# Patient Record
Sex: Female | Born: 1955 | Race: White | Hispanic: Yes | Marital: Single | Smoking: Never smoker
Health system: Southern US, Community
[De-identification: ages and names within clinical notes are randomized; demographics above are authoritative.]

## PROBLEM LIST (undated history)

## (undated) DIAGNOSIS — I10 Essential (primary) hypertension: Secondary | ICD-10-CM

## (undated) DIAGNOSIS — I2510 Atherosclerotic heart disease of native coronary artery without angina pectoris: Secondary | ICD-10-CM

## (undated) DIAGNOSIS — E119 Type 2 diabetes mellitus without complications: Secondary | ICD-10-CM

## (undated) HISTORY — DX: Atherosclerotic heart disease of native coronary artery without angina pectoris: I25.10

## (undated) HISTORY — PX: ABDOMINAL HYSTERECTOMY: SHX81

---

## 2016-01-30 ENCOUNTER — Emergency Department (HOSPITAL_COMMUNITY): Payer: Self-pay

## 2016-01-30 ENCOUNTER — Encounter (HOSPITAL_COMMUNITY): Payer: Self-pay | Admitting: *Deleted

## 2016-01-30 ENCOUNTER — Emergency Department (HOSPITAL_COMMUNITY)
Admission: EM | Admit: 2016-01-30 | Discharge: 2016-01-30 | Disposition: A | Discharge status: Home / Self Care | Payer: Self-pay | Attending: Emergency Medicine | Admitting: Emergency Medicine

## 2016-01-30 DIAGNOSIS — Z79899 Other long term (current) drug therapy: Secondary | ICD-10-CM | POA: Insufficient documentation

## 2016-01-30 DIAGNOSIS — J189 Pneumonia, unspecified organism: Secondary | ICD-10-CM

## 2016-01-30 DIAGNOSIS — J181 Lobar pneumonia, unspecified organism: Secondary | ICD-10-CM | POA: Insufficient documentation

## 2016-01-30 LAB — CBC WITH DIFFERENTIAL/PLATELET
BASOS PCT: 0 %
Basophils Absolute: 0.1 10*3/uL (ref 0.0–0.1)
Eosinophils Absolute: 0 10*3/uL (ref 0.0–0.7)
Eosinophils Relative: 0 %
HEMATOCRIT: 42.2 % (ref 36.0–46.0)
HEMOGLOBIN: 14.1 g/dL (ref 12.0–15.0)
LYMPHS ABS: 3.2 10*3/uL (ref 0.7–4.0)
LYMPHS PCT: 25 %
MCH: 27.5 pg (ref 26.0–34.0)
MCHC: 33.4 g/dL (ref 30.0–36.0)
MCV: 82.4 fL (ref 78.0–100.0)
MONOS PCT: 5 %
Monocytes Absolute: 0.7 10*3/uL (ref 0.1–1.0)
NEUTROS ABS: 8.9 10*3/uL — AB (ref 1.7–7.7)
NEUTROS PCT: 70 %
Platelets: 463 10*3/uL — ABNORMAL HIGH (ref 150–400)
RBC: 5.12 MIL/uL — ABNORMAL HIGH (ref 3.87–5.11)
RDW: 13.3 % (ref 11.5–15.5)
WBC: 12.8 10*3/uL — ABNORMAL HIGH (ref 4.0–10.5)

## 2016-01-30 LAB — I-STAT TROPONIN, ED: Troponin i, poc: 0.01 ng/mL (ref 0.00–0.08)

## 2016-01-30 LAB — COMPREHENSIVE METABOLIC PANEL
ALBUMIN: 3.2 g/dL — AB (ref 3.5–5.0)
ALK PHOS: 110 U/L (ref 38–126)
ALT: 21 U/L (ref 14–54)
ANION GAP: 7 (ref 5–15)
AST: 33 U/L (ref 15–41)
BILIRUBIN TOTAL: 0.5 mg/dL (ref 0.3–1.2)
BUN: 13 mg/dL (ref 6–20)
CALCIUM: 9.8 mg/dL (ref 8.9–10.3)
CO2: 26 mmol/L (ref 22–32)
Chloride: 107 mmol/L (ref 101–111)
Creatinine, Ser: 0.78 mg/dL (ref 0.44–1.00)
GFR calc Af Amer: >60 mL/min (ref >60)
GFR calc non Af Amer: >60 mL/min (ref >60)
GLUCOSE: 91 mg/dL (ref 65–99)
Potassium: 3.7 mmol/L (ref 3.5–5.1)
Sodium: 140 mmol/L (ref 135–145)
TOTAL PROTEIN: 6.8 g/dL (ref 6.5–8.1)

## 2016-01-30 MED ORDER — HYDROCOD POLST-CPM POLST ER 10-8 MG/5ML PO SUER
5.0000 mL | Freq: Once | ORAL | Status: AC
  Administered 2016-01-30: 5 mL via ORAL
  Filled 2016-01-30: qty 5

## 2016-01-30 MED ORDER — HYDROCODONE-HOMATROPINE 5-1.5 MG/5ML PO SYRP: 5.0000 mL | ORAL_SOLUTION | Freq: Four times a day (QID) | ORAL | 0 refills | Status: DC | PRN

## 2016-01-30 MED ORDER — PREDNISONE 10 MG PO TABS: 20.0000 mg | ORAL_TABLET | Freq: Every day | ORAL | 0 refills | Status: DC

## 2016-01-30 MED ORDER — DOXYCYCLINE HYCLATE 100 MG PO CAPS: 100.0000 mg | ORAL_CAPSULE | Freq: Two times a day (BID) | ORAL | 0 refills | Status: DC

## 2016-01-30 MED ORDER — IPRATROPIUM-ALBUTEROL 0.5-2.5 (3) MG/3ML IN SOLN
3.0000 mL | Freq: Once | RESPIRATORY_TRACT | Status: AC
  Administered 2016-01-30: 3 mL via RESPIRATORY_TRACT
  Filled 2016-01-30: qty 3

## 2016-01-30 NOTE — ED Notes (Signed)
Tammy Osborne [phone] - translator used

## 2016-01-30 NOTE — ED Notes (Signed)
Patient transported to X-ray 

## 2016-01-30 NOTE — ED Provider Notes (Signed)
[facility] DEPT Provider Note   CSN: [id number] Arrival date & time: 01/30/16  1110     History   Chief Complaint Chief Complaint  Patient presents with  . Chest Pain    HPI Tammy Osborne is a 61 y.o. female.  Cough and congestion for 3 weeks with associated chest pain associated with cough. She is already been on a course of Zithromax, meloxicam, Tessalon Perles with minimal relief.  No crushing substernal chest pain, dyspnea, diaphoresis, nausea. Past medical history includes hypertension. Nonsmoker. Severity is moderate.      History reviewed. No pertinent past medical history.  There are no active problems to display for this patient.   History reviewed. No pertinent surgical history.  OB History    No data available       Home Medications    Prior to Admission medications   Medication Sig Start Date End Date Taking? Authorizing Provider  albuterol (PROVENTIL HFA;VENTOLIN HFA) 108 (90 Base) MCG/ACT inhaler Inhale 2 puffs into the lungs every 6 (six) hours as needed for wheezing or shortness of breath.   Yes Historical Provider, MD  amLODipine (NORVASC) 5 MG tablet Take 5 mg by mouth daily. 12/30/15  Yes Historical Provider, MD  meloxicam (MOBIC) 15 MG tablet Take 15 mg by mouth daily.   Yes Historical Provider, MD  doxycycline (VIBRAMYCIN) 100 MG capsule Take 1 capsule (100 mg total) by mouth 2 (two) times daily. 01/30/16   Donnetta Hutching, MD  HYDROcodone-homatropine [facility]) 5-1.5 MG/5ML syrup Take 5 mLs by mouth every 6 (six) hours as needed for cough. 01/30/16   Donnetta Hutching, MD  predniSONE (DELTASONE) 10 MG tablet Take 2 tablets (20 mg total) by mouth daily. 01/30/16   Donnetta Hutching, MD    Family History No family history on file.  Social History Social History  Substance Use Topics  . Smoking status: Not on file  . Smokeless tobacco: Not on file  . Alcohol use Not on file     Allergies   Patient has no known allergies.   Review of Systems Review of  Systems  All other systems reviewed and are negative.    Physical Exam Updated Vital Signs BP 184/96   Pulse 71   Temp 98.1 F (36.7 C) (Oral)   Resp 19   LMP  (LMP Unknown)   SpO2 90%   Physical Exam  Constitutional: She is oriented to person, place, and time.  Coughing, no acute distress  HENT:  Head: Normocephalic and atraumatic.  Eyes: Conjunctivae are normal.  Neck: Neck supple.  Cardiovascular: Normal rate and regular rhythm.   Pulmonary/Chest: Effort normal and breath sounds normal.  Abdominal: Soft. Bowel sounds are normal.  Musculoskeletal: Normal range of motion.  Neurological: She is alert and oriented to person, place, and time.  Skin: Skin is warm and dry.  Psychiatric: She has a normal mood and affect. Her behavior is normal.  Nursing note and vitals reviewed.    ED Treatments / Results  Labs (all labs ordered are listed, but only abnormal results are displayed) Labs Reviewed  CBC WITH DIFFERENTIAL/PLATELET - Abnormal; Notable for the following:       Result Value   WBC 12.8 (*)    RBC 5.12 (*)    Platelets 463 (*)    Neutro Abs 8.9 (*)    All other components within normal limits  COMPREHENSIVE METABOLIC PANEL - Abnormal; Notable for the following:    Albumin 3.2 (*)    All other components  within normal limits  I-STAT TROPOININ, ED    EKG  EKG Interpretation None       Radiology Dg Chest 2 View  Result Date: 01/30/2016 CLINICAL DATA:  Pt reports left upper chest pain, SOB, and cough x 3 weeks EXAM: CHEST  2 VIEW COMPARISON:  None. FINDINGS: Cardiac silhouette is normal in size. No mediastinal or hilar masses. No convincing adenopathy. There are thickened bronchovascular markings bilaterally most apparent at the lung bases. Mild hazy airspace opacity is noted at the right base. Findings are consistent with combination of diffuse bronchial infection/ inflammation and right lower lobe pneumonia. No convincing pulmonary edema.  No pleural  effusion or pneumothorax. Skeletal structures are intact. IMPRESSION: 1. Diffuse bronchial wall inflammation/infection and smaller area of right lower lobe pneumonia. Electronically Signed   By: Amie Portlandavid  Ormond M.D.   On: 01/30/2016 12:14    Procedures Procedures (including critical care time)  Medications Ordered in ED Medications  ipratropium-albuterol (DUONEB) 0.5-2.5 (3) MG/3ML nebulizer solution 3 mL (3 mLs Nebulization Given 01/30/16 1249)  chlorpheniramine-HYDROcodone (TUSSIONEX) 10-8 MG/5ML suspension 5 mL (5 mLs Oral Given 01/30/16 1250)     Initial Impression / Assessment and Plan / ED Course  I have reviewed the triage vital signs and the nursing notes.  Pertinent labs & imaging results that were available during my care of the patient were reviewed by me and considered in my medical decision making (see chart for details).  Clinical Course     Patient is in no acute distress. Language line used.  Chest x-ray shows a small area of right lower lobe pneumonia. She is medically stable. She has failed a course of Zithromax. Will change Rx to doxycycline 100 mg twice a day and add prednisone and Hycodan cough syrup.  She will be using her inhaler also  Final Clinical Impressions(s) / ED Diagnoses   Final diagnoses:  Community acquired pneumonia of right middle lobe of lung (HCC)    New Prescriptions New Prescriptions   DOXYCYCLINE (VIBRAMYCIN) 100 MG CAPSULE    Take 1 capsule (100 mg total) by mouth 2 (two) times daily.   HYDROCODONE-HOMATROPINE (HYCODAN) 5-1.5 MG/5ML SYRUP    Take 5 mLs by mouth every 6 (six) hours as needed for cough.   PREDNISONE (DELTASONE) 10 MG TABLET    Take 2 tablets (20 mg total) by mouth daily.     Donnetta HutchingBrian Ronasia Isola, MD 01/30/16 [phone]

## 2016-01-30 NOTE — ED Triage Notes (Signed)
To ED for eval of cough, congestion, and cp for 3 wks. Taking prescribed meds and abx without relief

## 2016-01-30 NOTE — Discharge Instructions (Signed)
You have a small amount of pneumonia in your right lung.  Prescription for antibiotic, cough medicine and prednisone.  You can also take Tylenol or ibuprofen for fever or pain. Continue your inhaler, but discontinue the other prescriptions

## 2016-01-30 NOTE — ED Provider Notes (Signed)
[facility] Provider Note   CSN: [id number] Arrival date & time: 01/30/16  1110     History   Chief Complaint Chief Complaint  Patient presents with  . Chest Pain    HPI Tammy Osborne is a 61 y.o. female.  Patient presents with cough and chest congestion for 2-3 weeks. She has been on Zithromax, Tessalon Perles, meloxicam with minimal relief. No crushing substernal chest pain, diaphoresis, nausea. She is ambulatory and drinking fluids. Language line used for interpretation.      History reviewed. No pertinent past medical history.  There are no active problems to display for this patient.   History reviewed. No pertinent surgical history.  OB History    No data available       Home Medications    Prior to Admission medications   Medication Sig Start Date End Date Taking? Authorizing Provider  albuterol (PROVENTIL HFA;VENTOLIN HFA) 108 (90 Base) MCG/ACT inhaler Inhale 2 puffs into the lungs every 6 (six) hours as needed for wheezing or shortness of breath.   Yes Historical Provider, MD  amLODipine (NORVASC) 5 MG tablet Take 5 mg by mouth daily. 12/30/15  Yes Historical Provider, MD  meloxicam (MOBIC) 15 MG tablet Take 15 mg by mouth daily.   Yes Historical Provider, MD  HYDROcodone-homatropine (HYCODAN) 5-1.5 MG/5ML syrup Take 5 mLs by mouth every 6 (six) hours as needed for cough. 01/30/16   Donnetta HutchingBrian Oluwadarasimi Redmon, MD  predniSONE (DELTASONE) 10 MG tablet Take 2 tablets (20 mg total) by mouth daily. 01/30/16   Donnetta HutchingBrian Kaylea Mounsey, MD    Family History No family history on file.  Social History Social History  Substance Use Topics  . Smoking status: Not on file  . Smokeless tobacco: Not on file  . Alcohol use Not on file     Allergies   Patient has no known allergies.   Review of Systems Review of Systems  All other systems reviewed and are negative.    Physical Exam Updated Vital Signs BP 184/96   Pulse 71   Temp 98.1 F (36.7 C) (Oral)   Resp 19   LMP  (LMP  Unknown)   SpO2 90%   Physical Exam  Constitutional: She is oriented to person, place, and time.  Coughing but nontoxic-appearing.  HENT:  Head: Normocephalic and atraumatic.  Eyes: Conjunctivae are normal.  Neck: Neck supple.  Cardiovascular: Normal rate and regular rhythm.   Pulmonary/Chest: Effort normal and breath sounds normal.  Abdominal: Soft. Bowel sounds are normal.  Musculoskeletal: Normal range of motion.  Neurological: She is alert and oriented to person, place, and time.  Skin: Skin is warm and dry.  Psychiatric: She has a normal mood and affect. Her behavior is normal.  Nursing note and vitals reviewed.    ED Treatments / Results  Labs (all labs ordered are listed, but only abnormal results are displayed) Labs Reviewed  CBC WITH DIFFERENTIAL/PLATELET - Abnormal; Notable for the following:       Result Value   WBC 12.8 (*)    RBC 5.12 (*)    Platelets 463 (*)    Neutro Abs 8.9 (*)    All other components within normal limits  COMPREHENSIVE METABOLIC PANEL - Abnormal; Notable for the following:    Albumin 3.2 (*)    All other components within normal limits  I-STAT TROPOININ, ED    EKG  EKG Interpretation None       Radiology Dg Chest 2 View  Result Date: 01/30/2016 CLINICAL DATA:  Pt reports left upper chest pain, SOB, and cough x 3 weeks EXAM: CHEST  2 VIEW COMPARISON:  None. FINDINGS: Cardiac silhouette is normal in size. No mediastinal or hilar masses. No convincing adenopathy. There are thickened bronchovascular markings bilaterally most apparent at the lung bases. Mild hazy airspace opacity is noted at the right base. Findings are consistent with combination of diffuse bronchial infection/ inflammation and right lower lobe pneumonia. No convincing pulmonary edema.  No pleural effusion or pneumothorax. Skeletal structures are intact. IMPRESSION: 1. Diffuse bronchial wall inflammation/infection and smaller area of right lower lobe pneumonia.  Electronically Signed   By: Amie Portland M.D.   On: 01/30/2016 12:14    Procedures Procedures (including critical care time)  Medications Ordered in ED Medications  ipratropium-albuterol (DUONEB) 0.5-2.5 (3) MG/3ML nebulizer solution 3 mL (3 mLs Nebulization Given 01/30/16 1249)  chlorpheniramine-HYDROcodone (TUSSIONEX) 10-8 MG/5ML suspension 5 mL (5 mLs Oral Given 01/30/16 1250)     Initial Impression / Assessment and Plan / ED Course  I have reviewed the triage vital signs and the nursing notes.  Pertinent labs & imaging results that were available during my care of the patient were reviewed by me and considered in my medical decision making (see chart for details).  Clinical Course     History and physical most consistent with prolonged upper respiratory infection. Doubt ACS. Chest x-ray shows a right lower lobe pneumonia. Patient has been on Zithromax.  Discharge medications doxycycline 100 mg, Hycodan cough syrup, prednisone.  Final Clinical Impressions(s) / ED Diagnoses   Final diagnoses:  Community acquired pneumonia of right middle lobe of lung (HCC)    New Prescriptions New Prescriptions   HYDROCODONE-HOMATROPINE (HYCODAN) 5-1.5 MG/5ML SYRUP    Take 5 mLs by mouth every 6 (six) hours as needed for cough.   PREDNISONE (DELTASONE) 10 MG TABLET    Take 2 tablets (20 mg total) by mouth daily.     Donnetta Hutching, MD 01/31/16 [phone]

## 2016-01-30 NOTE — ED Notes (Signed)
Tammy DikeJennifer [phone] translator used to go over discharge instructions.

## 2016-01-30 NOTE — ED Notes (Signed)
Pt returned to room from xray.

## 2016-04-06 ENCOUNTER — Emergency Department (HOSPITAL_COMMUNITY): Payer: Self-pay

## 2016-04-06 ENCOUNTER — Emergency Department (HOSPITAL_COMMUNITY)
Admission: EM | Admit: 2016-04-06 | Discharge: 2016-04-06 | Disposition: A | Discharge status: Home / Self Care | Payer: Self-pay | Attending: Emergency Medicine | Admitting: Emergency Medicine

## 2016-04-06 ENCOUNTER — Encounter (HOSPITAL_COMMUNITY): Payer: Self-pay | Admitting: Emergency Medicine

## 2016-04-06 DIAGNOSIS — J189 Pneumonia, unspecified organism: Secondary | ICD-10-CM | POA: Insufficient documentation

## 2016-04-06 DIAGNOSIS — I10 Essential (primary) hypertension: Secondary | ICD-10-CM | POA: Insufficient documentation

## 2016-04-06 DIAGNOSIS — R0689 Other abnormalities of breathing: Secondary | ICD-10-CM | POA: Insufficient documentation

## 2016-04-06 DIAGNOSIS — Z79899 Other long term (current) drug therapy: Secondary | ICD-10-CM | POA: Insufficient documentation

## 2016-04-06 HISTORY — DX: Essential (primary) hypertension: I10

## 2016-04-06 LAB — CBC
HEMATOCRIT: 41.4 % (ref 36.0–46.0)
Hemoglobin: 13.5 g/dL (ref 12.0–15.0)
MCH: 26.4 pg (ref 26.0–34.0)
MCHC: 32.6 g/dL (ref 30.0–36.0)
MCV: 81 fL (ref 78.0–100.0)
PLATELETS: 343 10*3/uL (ref 150–400)
RBC: 5.11 MIL/uL (ref 3.87–5.11)
RDW: 14.1 % (ref 11.5–15.5)
WBC: 10.3 10*3/uL (ref 4.0–10.5)

## 2016-04-06 LAB — I-STAT TROPONIN, ED: Troponin i, poc: 0 ng/mL (ref 0.00–0.08)

## 2016-04-06 LAB — BASIC METABOLIC PANEL
Anion gap: 6 (ref 5–15)
BUN: 17 mg/dL (ref 6–20)
CHLORIDE: 104 mmol/L (ref 101–111)
CO2: 28 mmol/L (ref 22–32)
CREATININE: 0.84 mg/dL (ref 0.44–1.00)
Calcium: 9 mg/dL (ref 8.9–10.3)
GFR calc Af Amer: >60 mL/min (ref >60)
GFR calc non Af Amer: >60 mL/min (ref >60)
GLUCOSE: 110 mg/dL — AB (ref 65–99)
POTASSIUM: 3.6 mmol/L (ref 3.5–5.1)
SODIUM: 138 mmol/L (ref 135–145)

## 2016-04-06 LAB — BRAIN NATRIURETIC PEPTIDE: B NATRIURETIC PEPTIDE 5: 101.6 pg/mL — AB (ref 0.0–100.0)

## 2016-04-06 MED ORDER — AZITHROMYCIN 250 MG PO TABS: ORAL_TABLET | ORAL | 0 refills | Status: DC

## 2016-04-06 MED ORDER — ALBUTEROL SULFATE HFA 108 (90 BASE) MCG/ACT IN AERS: 1.0000 | INHALATION_SPRAY | Freq: Four times a day (QID) | RESPIRATORY_TRACT | 0 refills | Status: DC | PRN

## 2016-04-06 MED ORDER — BENZONATATE 100 MG PO CAPS: 100.0000 mg | ORAL_CAPSULE | Freq: Three times a day (TID) | ORAL | 0 refills | Status: DC | PRN

## 2016-04-06 MED ORDER — HYDROCHLOROTHIAZIDE 12.5 MG PO CAPS
12.5000 mg | ORAL_CAPSULE | Freq: Once | ORAL | Status: AC
  Administered 2016-04-06: 12.5 mg via ORAL
  Filled 2016-04-06: qty 1

## 2016-04-06 MED ORDER — LISINOPRIL 20 MG PO TABS
20.0000 mg | ORAL_TABLET | Freq: Once | ORAL | Status: AC
  Administered 2016-04-06: 20 mg via ORAL
  Filled 2016-04-06: qty 1

## 2016-04-06 MED ORDER — PREDNISONE 10 MG PO TABS: 40.0000 mg | ORAL_TABLET | Freq: Every day | ORAL | 0 refills | Status: AC

## 2016-04-06 NOTE — ED Triage Notes (Signed)
Patient been coughing with yellow phlegm, having chest and upper back pain for month.

## 2016-04-06 NOTE — Discharge Instructions (Signed)
Please follow-up with your primary care provider in one week. Take azithromycin as prescribed. Inhale 1-2 puffs of albuterol every 6 hours as needed. Take prednisone as prescribed. It is very important that he take 4 tablets daily each for 5 straight days. Take Tessalon Perles as needed for cough. Drink at least 8 glasses of water throughout the day.   Extensive diffuse bilateral ground-glass densities with mosaicism in  the bilateral lower lungs. Findings are nonspecific and could be due  to small airways disease/bronchiolitis, atypical  infection/pneumonia, and interstitial lung disease.   SOLICITE ATENCIN MDICA DE INMEDIATO SI: Experimenta un empeoramiento en la falta de aire. El dolor de pecho es cada vez ms intenso. La enfermedad empeora, especialmente si usted es un adulto mayor o su sistema inmunitario est debilitado. Tose y escupe sangre.

## 2016-04-06 NOTE — ED Provider Notes (Signed)
[facility]-EMERGENCY DEPT Provider Note   CSN: [id number] Arrival date & time: 04/06/16  1711     History   Chief Complaint Chief Complaint  Patient presents with  . Cough  . Chest Pain    HPI Tammy Osborne is a 61 y.o. female with PMHx of HTN, recently seen in January for pneumonia Presents today with complaints of improving productive cough for 1 month. She states she had nasal congestion previously but that has resolved as of 1-2 weeks. She reports yellow, no red. She reports associated chest and upper back pain, chills, nausea in the morning. She denies fevers, vomiting, diarrhea, urinary symptoms, changes in bowel movements. She has tried robitussin, hot tea with mild relief. Hx of Ca. Denies Hx of smoking, COPD, asthma. She denies cardiac history.She states she takes lovastatin for cholesterol and lisinopril-hydrochlorothiazide for blood pressure. She denies Family hx of heart disease. She states she is active. She denies sick contact. She states she normally goes to a provider in [employer] whenever she feels bad. She states she has a cardiologist who checks her blood pressure and gives her the blood pressure medications, she does not know who her provider is but is close to [facility]. She denies headache, blurry vision, double vision, changes in gait. Patient states that she is supposed to take her blood pressure medication twice a day and only takes it once a day at night because "sometimes she forgets". She denies taking it today.  The history is provided by the patient. No language interpreter was used.  Cough  Associated symptoms include chest pain and chills. Pertinent negatives include no myalgias and no shortness of breath.  Chest Pain   Associated symptoms include cough. Pertinent negatives include no abdominal pain, no fever, no shortness of breath and no vomiting.    Past Medical History:  Diagnosis Date  . Hypertension     There are no active problems to display for this  patient.   Past Surgical History:  Procedure Laterality Date  . ABDOMINAL HYSTERECTOMY      OB History    No data available       Home Medications    Prior to Admission medications   Medication Sig Start Date End Date Taking? Authorizing Provider  lisinopril-hydrochlorothiazide (PRINZIDE,ZESTORETIC) 20-12.5 MG tablet Take 1 tablet by mouth 2 (two) times daily.   Yes Historical Provider, MD  lovastatin (MEVACOR) 40 MG tablet Take 40 mg by mouth at bedtime.   Yes Historical Provider, MD  albuterol (PROVENTIL HFA;VENTOLIN HFA) 108 (90 Base) MCG/ACT inhaler Inhale 1-2 puffs into the lungs every 6 (six) hours as needed for wheezing or shortness of breath. 04/06/16   Islah Eve Manuel [place], [place]  azithromycin (ZITHROMAX Z-PAK) 250 MG tablet 500 mg on day 1, 250 mg PO days 2-5 04/06/16   [facility] [place], [place]  benzonatate (TESSALON) 100 MG capsule Take 1 capsule (100 mg total) by mouth 3 (three) times daily as needed for cough. 04/06/16   Hadlea Furuya Manuel [place], [place]  HYDROcodone-homatropine (HYCODAN) 5-1.5 MG/5ML syrup Take 5 mLs by mouth every 6 (six) hours as needed for cough. Patient not taking: Reported on 04/06/2016 01/30/16   Donnetta Hutching, MD  nitroGLYCERIN (NITROSTAT) 0.4 MG SL tablet U UTD 01/27/16   Historical Provider, MD  predniSONE (DELTASONE) 10 MG tablet Take 4 tablets (40 mg total) by mouth daily. 04/06/16 04/11/16  Brenley Priore [place], [place]    Family History No family history on file.  Social History Social History  Substance  Use Topics  . Smoking status: Never Smoker  . Smokeless tobacco: Never Used  . Alcohol use No     Allergies   Patient has no known allergies.   Review of Systems Review of Systems  Constitutional: Positive for chills. Negative for fever.  HENT: Negative for congestion.   Respiratory: Positive for cough. Negative for shortness of breath.   Cardiovascular: Positive for chest pain.  Gastrointestinal: Negative for abdominal pain and vomiting.    Genitourinary: Negative for difficulty urinating and dysuria.  Musculoskeletal: Negative for myalgias.  All other systems reviewed and are negative.    Physical Exam Updated Vital Signs BP 195/74   Pulse 72 Comment: will continue to monitor  Temp 98.4 F (36.9 C) (Oral)   Resp (!) 28 Comment: will continue to monitor  Wt 56.7 kg   LMP  (LMP Unknown)   SpO2 95% Comment: will continue to monitor  Physical Exam  Constitutional: She is oriented to person, place, and time. She appears well-developed and well-nourished.  Well appearing  HENT:  Head: Normocephalic and atraumatic.  Right Ear: External ear normal.  Left Ear: External ear normal.  Nose: Nose normal.  Mouth/Throat: Oropharynx is clear and moist. No oropharyngeal exudate.  Oropharynx without evidence of redness or exudates. Tonsils without evidence of redness, swelling, or exudates. TM's appear normal with no evidence of bulging. EAC appear non erythematous and not swollen  Eyes: Conjunctivae and EOM are normal. Pupils are equal, round, and reactive to light.  Neck: Normal range of motion.  Normal ROM. No nuchal rigidity.   Cardiovascular: Normal rate, normal heart sounds and intact distal pulses.   Pulmonary/Chest: Effort normal and breath sounds normal. No respiratory distress. She has no wheezes. She has no rales.  Normal work of breathing. No respiratory distress noted.   Abdominal: Soft. There is no tenderness. There is no rebound and no guarding.  Soft and nontender. No rebound. No guarding. Negative murphy's sign. No focal tenderness at McBurney's point. No CVA tenderness. No evidence of hernia  Musculoskeletal: Normal range of motion.  Neurological: She is alert and oriented to person, place, and time.  Skin: Skin is warm.  Psychiatric: She has a normal mood and affect. Her behavior is normal.  Nursing note and vitals reviewed.    ED Treatments / Results  Labs (all labs ordered are listed, but only abnormal  results are displayed) Labs Reviewed  BASIC METABOLIC PANEL - Abnormal; Notable for the following:       Result Value   Glucose, Bld 110 (*)    All other components within normal limits  BRAIN NATRIURETIC PEPTIDE - Abnormal; Notable for the following:    B Natriuretic Peptide 101.6 (*)    All other components within normal limits  CBC  I-STAT TROPOININ, ED    EKG  EKG Interpretation  Date/Time:  Friday April 06 2016 17:25:34 EST Ventricular Rate:  77 PR Interval:    QRS Duration: 86 QT Interval:  394 QTC Calculation: 446 R Axis:   39 Text Interpretation:  Sinus rhythm Probable LVH with secondary repol abnrm Baseline wander in lead(s) II aVF Confirmed by [facility] MD, Barbara Cower [id number]) on 04/06/2016 9:00:33 PM       Radiology Dg Chest 2 View  Result Date: 04/06/2016 CLINICAL DATA:  Cough, yellow phlegm EXAM: CHEST  2 VIEW COMPARISON:  01/30/2016 FINDINGS: There is bilateral interstitial thickening likely chronic. There is no focal parenchymal opacity. There is no pleural effusion or pneumothorax. The heart and mediastinal  contours are unremarkable. The osseous structures are unremarkable. IMPRESSION: No active cardiopulmonary disease. Electronically Signed   By: Elige Ko   On: 04/06/2016 18:05   Ct Chest Wo Contrast  Result Date: 04/06/2016 CLINICAL DATA:  Coughing yellow phlegm pain in the upper back EXAM: CT CHEST WITHOUT CONTRAST TECHNIQUE: Multidetector CT imaging of the chest was performed following the standard protocol without IV contrast. COMPARISON:  04/07/2006 FINDINGS: Cardiovascular: Limited evaluation without intravenous contrast. Non aneurysmal aorta. Atherosclerotic vascular calcifications. Coronary artery calcifications. Normal heart size. No large pericardial effusion. Mediastinum/Nodes: Mild mediastinal adenopathy is present, a right paratracheal lymph node measures 7 mm, series 3, image number 32. Multiple subcentimeter precarinal lymph nodes and AP window lymph nodes.  Limited evaluation for hilar adenopathy. Trachea is midline. Thyroid normal. Esophagus within normal limits. Lungs/Pleura: No pleural effusion. Diffuse bilateral ground-glass densities with mosaic pattern inferiorly. No pneumothorax. Upper Abdomen: No acute abnormality in the upper abdomen. Musculoskeletal: Degenerative changes. No acute or suspicious bone lesions. IMPRESSION: Extensive diffuse bilateral ground-glass densities with mosaicism in the bilateral lower lungs. Findings are nonspecific and could be due to small airways disease/bronchiolitis, atypical infection/pneumonia, and interstitial lung disease. There is mild mediastinal adenopathy. Electronically Signed   By: Jasmine Pang M.D.   On: 04/06/2016 22:59    Procedures Procedures (including critical care time)  Medications Ordered in ED Medications  lisinopril (PRINIVIL,ZESTRIL) tablet 20 mg (20 mg Oral Given 04/06/16 2301)  hydrochlorothiazide (MICROZIDE) capsule 12.5 mg (12.5 mg Oral Given 04/06/16 2301)     Initial Impression / Assessment and Plan / ED Course  I have reviewed the triage vital signs and the nursing notes.  Pertinent labs & imaging results that were available during my care of the patient were reviewed by me and considered in my medical decision making (see chart for details).    Patient here with possible pneumonia vs bronchiolitis vs interstitial lung disease on CT. Patient presents with a recent congestion and cough, she has had chest pain and upper back pain secondary to her cough.  Risk Factors HTN, HLD. Chest pain is not likely of cardiac or pulmonary etiology d/t presentation, VSS, no tracheal deviation, no JVD or new murmur, RRR, breath sounds equal bilaterally, EKG without acute abnormalities, negative troponin, and negative CXR. Wells criteria 0. Heart score 2. BNP minimally elevated to 101.6 and not clinically significant for suspicion of CHF. Pt given lisinopril and hydrochlorothiazide, which are her home BP  medications, here. Pt has been advised to return to the ED if CP becomes exertional, associated with diaphoresis or nausea, radiates to left jaw/arm, worsens or becomes concerning in any way. Patient is to be discharged with recommendation to follow up with PCP in regards to today's hospital visit. Patient given patient will be given antibiotics and medications to help cough symptoms. Pt appears reliable for follow up and is agreeable to discharge. Patient is in no acute distress. She has had increased blood pressure, however she has a history of hypertension and she has been noncompliant with her blood pressure medications. BP improved after being given BP medication here in Ed. Patient counseled on the importance of taking her blood pressure medications as prescribed every single day and following up with her primary care provider in week regarding this. Patient is able to ambulate. Patient able to tolerate PO.   Patient case discussed with Dr. Clayborne Dana who agreed with assessment and plan.   Final Clinical Impressions(s) / ED Diagnoses   Final diagnoses:  Community acquired pneumonia,  unspecified laterality    New Prescriptions New Prescriptions   ALBUTEROL (PROVENTIL HFA;VENTOLIN HFA) 108 (90 BASE) MCG/ACT INHALER    Inhale 1-2 puffs into the lungs every 6 (six) hours as needed for wheezing or shortness of breath.   AZITHROMYCIN (ZITHROMAX Z-PAK) 250 MG TABLET    500 mg on day 1, 250 mg PO days 2-5   BENZONATATE (TESSALON) 100 MG CAPSULE    Take 1 capsule (100 mg total) by mouth 3 (three) times daily as needed for cough.   PREDNISONE (DELTASONE) 10 MG TABLET    Take 4 tablets (40 mg total) by mouth daily.     [address]isco Manuel [place], [place] 04/06/16 2318    Marily MemosJason Mesner, MD 04/07/16 Jacinta [id number]

## 2016-04-30 ENCOUNTER — Encounter (HOSPITAL_COMMUNITY): Payer: Self-pay | Admitting: Emergency Medicine

## 2016-04-30 ENCOUNTER — Emergency Department (HOSPITAL_COMMUNITY)
Admission: EM | Admit: 2016-04-30 | Discharge: 2016-04-30 | Disposition: A | Discharge status: Home / Self Care | Payer: Self-pay | Attending: Emergency Medicine | Admitting: Emergency Medicine

## 2016-04-30 DIAGNOSIS — R040 Epistaxis: Secondary | ICD-10-CM | POA: Insufficient documentation

## 2016-04-30 DIAGNOSIS — I10 Essential (primary) hypertension: Secondary | ICD-10-CM | POA: Insufficient documentation

## 2016-04-30 LAB — PROTIME-INR
INR: 1.05
Prothrombin Time: 13.7 seconds (ref 11.4–15.2)

## 2016-04-30 LAB — I-STAT CHEM 8, ED
BUN: 25 mg/dL — AB (ref 6–20)
CALCIUM ION: 1.1 mmol/L — AB (ref 1.15–1.40)
CHLORIDE: 110 mmol/L (ref 101–111)
CREATININE: 0.7 mg/dL (ref 0.44–1.00)
GLUCOSE: 109 mg/dL — AB (ref 65–99)
HCT: 32 % — ABNORMAL LOW (ref 36.0–46.0)
HEMOGLOBIN: 10.9 g/dL — AB (ref 12.0–15.0)
Potassium: 3.8 mmol/L (ref 3.5–5.1)
SODIUM: 143 mmol/L (ref 135–145)
TCO2: 24 mmol/L (ref 0–100)

## 2016-04-30 LAB — CBC WITH DIFFERENTIAL/PLATELET
BASOS ABS: 0 10*3/uL (ref 0.0–0.1)
Basophils Relative: 0 %
Eosinophils Absolute: 0 10*3/uL (ref 0.0–0.7)
Eosinophils Relative: 0 %
HEMATOCRIT: 33.6 % — AB (ref 36.0–46.0)
Hemoglobin: 10.7 g/dL — ABNORMAL LOW (ref 12.0–15.0)
LYMPHS PCT: 12 %
Lymphs Abs: 1.9 10*3/uL (ref 0.7–4.0)
MCH: 25.8 pg — ABNORMAL LOW (ref 26.0–34.0)
MCHC: 31.8 g/dL (ref 30.0–36.0)
MCV: 81.2 fL (ref 78.0–100.0)
Monocytes Absolute: 0.8 10*3/uL (ref 0.1–1.0)
Monocytes Relative: 5 %
Neutro Abs: 13.2 10*3/uL — ABNORMAL HIGH (ref 1.7–7.7)
Neutrophils Relative %: 83 %
PLATELETS: 343 10*3/uL (ref 150–400)
RBC: 4.14 MIL/uL (ref 3.87–5.11)
RDW: 14.8 % (ref 11.5–15.5)
WBC: 15.9 10*3/uL — AB (ref 4.0–10.5)

## 2016-04-30 LAB — BASIC METABOLIC PANEL
ANION GAP: 4 — AB (ref 5–15)
BUN: 25 mg/dL — ABNORMAL HIGH (ref 6–20)
CO2: 24 mmol/L (ref 22–32)
Calcium: 8.1 mg/dL — ABNORMAL LOW (ref 8.9–10.3)
Chloride: 112 mmol/L — ABNORMAL HIGH (ref 101–111)
Creatinine, Ser: 0.81 mg/dL (ref 0.44–1.00)
GFR calc Af Amer: >60 mL/min (ref >60)
Glucose, Bld: 127 mg/dL — ABNORMAL HIGH (ref 65–99)
POTASSIUM: 3.7 mmol/L (ref 3.5–5.1)
SODIUM: 140 mmol/L (ref 135–145)

## 2016-04-30 LAB — ABO/RH: ABO/RH(D): B POS

## 2016-04-30 LAB — PREPARE RBC (CROSSMATCH)

## 2016-04-30 MED ORDER — OXYMETAZOLINE HCL 0.05 % NA SOLN
1.0000 | Freq: Once | NASAL | Status: AC
  Administered 2016-04-30: 1 via NASAL
  Filled 2016-04-30: qty 15

## 2016-04-30 MED ORDER — SODIUM CHLORIDE 0.9 % IV BOLUS (SEPSIS)
1000.0000 mL | Freq: Once | INTRAVENOUS | Status: AC
  Administered 2016-04-30: 1000 mL via INTRAVENOUS

## 2016-04-30 MED ORDER — SODIUM CHLORIDE 0.9 % IV SOLN: Freq: Once | INTRAVENOUS | Status: DC

## 2016-04-30 NOTE — ED Notes (Signed)
Pt ambulating in hallway independently with [place], [place], stand by assist.

## 2016-04-30 NOTE — ED Provider Notes (Signed)
[facility]ER[facility] DEPT Provider Note   CSN: [id number] Arrival date & time: 04/30/16  4098     History   Chief Complaint Chief Complaint  Patient presents with  . Epistaxis    HPI Tammy Osborne is a 61 y.o. female.  HPI   Patient's nephew at bedside providing translation at visions request  61 year old female with a history of hypertension presents today with epistaxis.  Patient was most recently seen on 04/06/2016 with pneumonia.  Patient notes she was in her usual state of health when she started to have a right anterior nosebleed this morning.  She reports this was not caused by any trauma, she denies any significant history of the same.  Patient denies any other bruising or bleeding, reports she does not take any blood thinners or anticoagulation.  She notes soaking through several washcloths at home.  Past Medical History:  Diagnosis Date  . Hypertension     There are no active problems to display for this patient.   Past Surgical History:  Procedure Laterality Date  . ABDOMINAL HYSTERECTOMY      OB History    No data available       Home Medications    Prior to Admission medications   Medication Sig Start Date End Date Taking? Authorizing Provider  albuterol (PROVENTIL HFA;VENTOLIN HFA) 108 (90 Base) MCG/ACT inhaler Inhale 1-2 puffs into the lungs every 6 (six) hours as needed for wheezing or shortness of breath. 04/06/16   Francisco Manuel [place], [place]  azithromycin (ZITHROMAX Z-PAK) 250 MG tablet 500 mg on day 1, 250 mg PO days 2-5 04/06/16   [facility] [place], [place]  benzonatate (TESSALON) 100 MG capsule Take 1 capsule (100 mg total) by mouth 3 (three) times daily as needed for cough. 04/06/16   Francisco Manuel [place], [place]  HYDROcodone-homatropine (HYCODAN) 5-1.5 MG/5ML syrup Take 5 mLs by mouth every 6 (six) hours as needed for cough. Patient not taking: Reported on 04/06/2016 01/30/16   Donnetta Hutching, MD  lisinopril-hydrochlorothiazide (PRINZIDE,ZESTORETIC) 20-12.5 MG  tablet Take 1 tablet by mouth 2 (two) times daily.    Historical Provider, MD  lovastatin (MEVACOR) 40 MG tablet Take 40 mg by mouth at bedtime.    Historical Provider, MD  nitroGLYCERIN (NITROSTAT) 0.4 MG SL tablet U UTD 01/27/16   Historical Provider, MD    Family History No family history on file.  Social History Social History  Substance Use Topics  . Smoking status: Never Smoker  . Smokeless tobacco: Never Used  . Alcohol use No     Allergies   Patient has no known allergies.   Review of Systems Review of Systems  All other systems reviewed and are negative.    Physical Exam Updated Vital Signs BP (!) 180/76 (BP Location: Right Arm)   Pulse 87   Temp 98 F (36.7 C) (Oral)   Resp 16   LMP  (LMP Unknown)   SpO2 98%   Physical Exam  Constitutional: She is oriented to person, place, and time. She appears well-developed and well-nourished.  HENT:  Head: Normocephalic and atraumatic.  Significant clot burden in the right anterior nostril-clot removed with active bleeding from the right nostril, unable to locate source  Eyes: Conjunctivae are normal. Pupils are equal, round, and reactive to light. Right eye exhibits no discharge. Left eye exhibits no discharge. No scleral icterus.  Neck: Normal range of motion. No JVD present. No tracheal deviation present.  Pulmonary/Chest: Effort normal. No stridor.  Neurological: She is alert and  oriented to person, place, and time. Coordination normal.  Psychiatric: She has a normal mood and affect. Her behavior is normal. Judgment and thought content normal.  Nursing note and vitals reviewed.    ED Treatments / Results  Labs (all labs ordered are listed, but only abnormal results are displayed) Labs Reviewed  BASIC METABOLIC PANEL - Abnormal; Notable for the following:       Result Value   Chloride 112 (*)    Glucose, Bld 127 (*)    BUN 25 (*)    Calcium 8.1 (*)    Anion gap 4 (*)    All other components within normal  limits  CBC WITH DIFFERENTIAL/PLATELET - Abnormal; Notable for the following:    WBC 15.9 (*)    Hemoglobin 10.7 (*)    HCT 33.6 (*)    MCH 25.8 (*)    Neutro Abs 13.2 (*)    All other components within normal limits  I-STAT CHEM 8, ED - Abnormal; Notable for the following:    BUN 25 (*)    Glucose, Bld 109 (*)    Calcium, Ion 1.10 (*)    Hemoglobin 10.9 (*)    HCT 32.0 (*)    All other components within normal limits  CBC WITH DIFFERENTIAL/PLATELET  PROTIME-INR  TYPE AND SCREEN  PREPARE RBC (CROSSMATCH)  ABO/RH    EKG  EKG Interpretation None       Radiology No results found.  Procedures Procedures (including critical care time)  Medications Ordered in ED Medications  0.9 %  sodium chloride infusion (not administered)  oxymetazoline (AFRIN) 0.05 % nasal spray 1 spray (1 spray Each Nare Given 04/30/16 0631)  sodium chloride 0.9 % bolus 1,000 mL (0 mLs Intravenous Stopped 04/30/16 0750)  sodium chloride 0.9 % bolus 1,000 mL (0 mLs Intravenous Stopped 04/30/16 0950)     Initial Impression / Assessment and Plan / ED Course  I have reviewed the triage vital signs and the nursing notes.  Pertinent labs & imaging results that were available during my care of the patient were reviewed by me and considered in my medical decision making (see chart for details).      Final Clinical Impressions(s) / ED Diagnoses   Final diagnoses:  Epistaxis    Labs: CBC, BMP, type and screen, ABO/RH  Imaging:  Consults:  Therapeutics:  Discharge Meds:   Assessment/Plan:   61 year old female presents today with epistaxis.  Patient was in her usual state of health, started to have an anterior nosebleed on the right side this morning.  She denies any trauma to this, no history of the same.  Time of evaluation she had significant clot in the right anterior nostril with surrounding bleed.  Clot was removed, was unable to directly visualize the source.  Afrin was sprayed into the  nostril followed by Afrin soaked gauze.  This stopped the bleed.  Patient continued to [facility] well, she did have a presyncopal episode per family.  She was laid flat as the bleed had stopped which improved her presyncopal feelings.  Patient is normotensive and has remained normotensive she is no longer tachycardic.  Patient was given IV fluids with labs drawn.  Initial hemoglobin showed 6 which is a significant drop from 13.5 one month ago.  Patient's potassium was 1 which questions the validity of her labs.  Repeat analysis will be performed.  Repeat CBC shows a hemoglobin of 10.7, and i-STAT hemoglobin of 10.9 which is more in line with patient's presentation.  Patient has had no significant nosebleed since packing.  Packing was removed I was unable to visualize a source of bleed, I did place another gauze packing in right nostril.  I personally ambulated the patient in the hallway with no dizziness, her vital signs have been reassuring.  Her laboratory analysis showed no other significant abnormalities.  Patient is instructed to remove the packing tomorrow morning after moistening it, she does not have a primary care provider and was instructed to return to emergency room if she needed any assistance or if any new or worsening signs or symptoms presented.  Patient verbalized understanding and agreement to today's plan had no further questions or concerns at time of discharge    New Prescriptions New Prescriptions   No medications on file     Eyvonne Mechanic, PA-C 04/30/16 1022    Zadie Rhine, MD 05/01/16 2350

## 2016-04-30 NOTE — ED Notes (Signed)
Lavender and light green tube re-drawn and sent down to lab.

## 2016-04-30 NOTE — ED Notes (Signed)
Pt A&OX4, ambulatory at d/c with steady gait, NAD. D/c reviewed with patient using ipad language interpreter. Pt verbalized understanding and follow up care with no further questions.

## 2016-04-30 NOTE — Discharge Instructions (Signed)
Please read attached information. If you experience any new or worsening signs or symptoms please return to the emergency room for evaluation.  Please remove packing tomorrow if no bleeding has occurred, he may also return to emergency room as needed for packing removal.  Please use medication prescribed only as directed and discontinue taking if you have any concerning signs or symptoms.

## 2016-04-30 NOTE — ED Notes (Signed)
Burna Forts, PA at bedside.

## 2016-04-30 NOTE — ED Notes (Signed)
Per main lab request to redraw lavender and light green tubes to confirm abnormal labs prior to intervention. Hedges PA made aware and in agreement with plan of care. Compton RN at bedside drawing repeat labs at present time.

## 2016-04-30 NOTE — ED Provider Notes (Signed)
Patient seen/examined in the Emergency Department in conjunction with Midlevel Provider Hedges Patient presents with acute epistaxis Exam : awake/alert, right sided epistaxis Plan: pt will likely need to have rhino rocket placed She is not on anticoagulants     Zadie Rhine, MD 04/30/16 [phone]

## 2016-04-30 NOTE — ED Notes (Signed)
Bed: ZO10 Expected date:  Expected time:  Means of arrival:  Comments: 60 yo F  epistaxis

## 2016-04-30 NOTE — ED Triage Notes (Signed)
Pt began having nose bleed this morning while leaning over her sink. Pt has high blood pressure. Denies any pain, headache, or injury causing bleed. Pt has soaked 3 abd pads prior to arrival.  170/106 HR 120

## 2016-05-04 LAB — TYPE AND SCREEN
ABO/RH(D): B POS
Antibody Screen: NEGATIVE
Unit division: 0

## 2016-05-04 LAB — BPAM RBC
Blood Product Expiration Date: 201804302359
Unit Type and Rh: 7300

## 2016-05-11 ENCOUNTER — Emergency Department (HOSPITAL_COMMUNITY)
Admission: EM | Admit: 2016-05-11 | Discharge: 2016-05-11 | Disposition: A | Discharge status: Home / Self Care | Payer: Self-pay | Attending: Emergency Medicine | Admitting: Emergency Medicine

## 2016-05-11 ENCOUNTER — Encounter (HOSPITAL_COMMUNITY): Payer: Self-pay | Admitting: Emergency Medicine

## 2016-05-11 DIAGNOSIS — R040 Epistaxis: Secondary | ICD-10-CM | POA: Insufficient documentation

## 2016-05-11 DIAGNOSIS — Z79899 Other long term (current) drug therapy: Secondary | ICD-10-CM | POA: Insufficient documentation

## 2016-05-11 DIAGNOSIS — I10 Essential (primary) hypertension: Secondary | ICD-10-CM | POA: Insufficient documentation

## 2016-05-11 LAB — CBC
HCT: 36.3 % (ref 36.0–46.0)
HEMOGLOBIN: 11.7 g/dL — AB (ref 12.0–15.0)
MCH: 26.6 pg (ref 26.0–34.0)
MCHC: 32.2 g/dL (ref 30.0–36.0)
MCV: 82.5 fL (ref 78.0–100.0)
PLATELETS: 544 10*3/uL — AB (ref 150–400)
RBC: 4.4 MIL/uL (ref 3.87–5.11)
RDW: 15 % (ref 11.5–15.5)
WBC: 14.9 10*3/uL — ABNORMAL HIGH (ref 4.0–10.5)

## 2016-05-11 LAB — PROTIME-INR
INR: 1.01
PROTHROMBIN TIME: 13.3 s (ref 11.4–15.2)

## 2016-05-11 MED ORDER — OXYMETAZOLINE HCL 0.05 % NA SOLN
2.0000 | Freq: Once | NASAL | Status: AC
  Administered 2016-05-11: 2 via NASAL
  Filled 2016-05-11: qty 15

## 2016-05-11 MED ORDER — SILVER NITRATE-POT NITRATE 75-25 % EX MISC
1.0000 | Freq: Once | CUTANEOUS | Status: AC
Start: 2016-05-11 — End: 2016-05-11
  Administered 2016-05-11: 1 via TOPICAL
  Filled 2016-05-11: qty 1

## 2016-05-11 MED ORDER — OXYMETAZOLINE HCL 0.05 % NA SOLN: 1.0000 | Freq: Two times a day (BID) | NASAL | 0 refills | Status: AC | PRN

## 2016-05-11 MED ORDER — LIDOCAINE HCL 2 % EX GEL
1.0000 "application " | Freq: Once | CUTANEOUS | Status: AC
  Administered 2016-05-11: 1 via TOPICAL
  Filled 2016-05-11: qty 11

## 2016-05-11 MED ORDER — MUPIROCIN CALCIUM 2 % NA OINT: TOPICAL_OINTMENT | NASAL | 0 refills | Status: DC

## 2016-05-11 NOTE — ED Provider Notes (Signed)
[facility]-EMERGENCY DEPT Provider Note   CSN: [id number] Arrival date & time: 05/11/16  1033     History   Chief Complaint Chief Complaint  Patient presents with  . Headache  . Epistaxis    HPI Tammy Osborne is a 61 y.o. female.  HPI   61 yo F with PMHx HTN, epistaxis here with right sided nose bleed. Pt seen two weeks ago with similar sx. She states that she had no bleeding since then until this morning. She was eating at the time when she felt something hot running out of her nose. She then noticed significant amount of bright red blood coming from her right nose. No blood down her mouth or throat. Since then, she has had intermittent bleeding. She tried to apply pressure without success. She has not seen an ENT. She has had mild associated nose pain. No fever. NO sinus pain. No headache. She is notably anxious and is concerned about how much blood she is losing.  Past Medical History:  Diagnosis Date  . Hypertension     There are no active problems to display for this patient.   Past Surgical History:  Procedure Laterality Date  . ABDOMINAL HYSTERECTOMY      OB History    No data available       Home Medications    Prior to Admission medications   Medication Sig Start Date End Date Taking? Authorizing Provider  lisinopril-hydrochlorothiazide (PRINZIDE,ZESTORETIC) 20-12.5 MG tablet Take 1 tablet by mouth 2 (two) times daily.    Yes Historical Provider, MD  lovastatin (MEVACOR) 40 MG tablet Take 40 mg by mouth at bedtime.   Yes Historical Provider, MD  nitroGLYCERIN (NITROSTAT) 0.4 MG SL tablet Place 0.4 mg under the tongue every 5 (five) minutes as needed for chest pain.   Yes Historical Provider, MD  mupirocin nasal ointment (BACTROBAN) 2 % Apply in each nostril twice daily 05/11/16   Shaune Pollack, MD    Family History No family history on file.  Social History Social History  Substance Use Topics  . Smoking status: Never Smoker  . Smokeless tobacco: Never  Used  . Alcohol use No     Allergies   Patient has no known allergies.   Review of Systems Review of Systems  Constitutional: Positive for fatigue. Negative for chills and fever.  HENT: Positive for nosebleeds. Negative for congestion, rhinorrhea and sore throat.   Eyes: Negative for visual disturbance.  Respiratory: Negative for cough, shortness of breath and wheezing.   Cardiovascular: Negative for chest pain and leg swelling.  Gastrointestinal: Negative for abdominal pain, diarrhea, nausea and vomiting.  Genitourinary: Negative for dysuria, flank pain, vaginal bleeding and vaginal discharge.  Musculoskeletal: Negative for neck pain.  Skin: Negative for rash.  Allergic/Immunologic: Negative for immunocompromised state.  Neurological: Negative for syncope and headaches.  Hematological: Does not bruise/bleed easily.  Psychiatric/Behavioral: The patient is nervous/anxious.   All other systems reviewed and are negative.    Physical Exam Updated Vital Signs BP 128/73   Pulse 92   Temp 98.3 F (36.8 C) (Oral)   Resp 16   Wt 125 lb (56.7 kg)   LMP  (LMP Unknown)   SpO2 97%   Physical Exam  Constitutional: She is oriented to person, place, and time. She appears well-developed and well-nourished. No distress.  HENT:  Head: Normocephalic and atraumatic.  Significant mucosal pallor bilaterally. Right anterior nares with small amount of clot adhered to Kiesselbach's plexus. No arterial bleeding. No blood in  posterior pharynx.  Eyes: Conjunctivae are normal.  Neck: Neck supple.  Cardiovascular: Normal rate, regular rhythm and normal heart sounds.  Exam reveals no friction rub.   No murmur heard. Pulmonary/Chest: Effort normal and breath sounds normal. No respiratory distress. She has no wheezes. She has no rales.  Abdominal: She exhibits no distension.  Musculoskeletal: She exhibits no edema.  Neurological: She is alert and oriented to person, place, and time. She exhibits  normal muscle tone.  Skin: Skin is warm. Capillary refill takes less than 2 seconds.  Psychiatric: She has a normal mood and affect.  Nursing note and vitals reviewed.    ED Treatments / Results  Labs (all labs ordered are listed, but only abnormal results are displayed) Labs Reviewed  CBC - Abnormal; Notable for the following:       Result Value   WBC 14.9 (*)    Hemoglobin 11.7 (*)    Platelets 544 (*)    All other components within normal limits  PROTIME-INR    EKG  EKG Interpretation  Date/Time:  Friday May 11 2016 10:50:01 EDT Ventricular Rate:  119 PR Interval:    QRS Duration: 99 QT Interval:  323 QTC Calculation: 455 R Axis:   40 Text Interpretation:  Sinus tachycardia LVH with secondary repolarization abnormality No significant change since last tracing Confirmed by Shonya Sumida MD, Sheria Lang [phone]) on 05/11/2016 12:45:00 PM       Radiology No results found.  Procedures .Epistaxis Management Date/Time: 05/11/2016 12:50 PM Performed by: Shaune Pollack Authorized by: Shaune Pollack   Consent:    Consent obtained:  Verbal   Consent given by:  Patient   Risks discussed:  Bleeding, nasal injury and pain   Alternatives discussed:  Delayed treatment and alternative treatment Anesthesia (see MAR for exact dosages):    Anesthesia method:  Topical application   Topical anesthetic:  Lidocaine gel Procedure details:    Treatment site:  R anterior   Treatment method:  Silver nitrate   Treatment complexity:  Limited   Treatment episode: recurring   Post-procedure details:    Assessment:  Bleeding stopped   Patient tolerance of procedure:  Tolerated well, no immediate complications   (including critical care time)  Medications Ordered in ED Medications  silver nitrate applicators applicator 1 Stick (not administered)  lidocaine (XYLOCAINE) 2 % jelly 1 application (not administered)  oxymetazoline (AFRIN) 0.05 % nasal spray 2 spray (2 sprays Each Nare Given  05/11/16 1155)     Initial Impression / Assessment and Plan / ED Course  I have reviewed the triage vital signs and the nursing notes.  Pertinent labs & imaging results that were available during my care of the patient were reviewed by me and considered in my medical decision making (see chart for details).    61 yo F here with right-sided anterior epistaxis, likely 2/2 seasonal allergies and dry air from heat in house. No blood thinner use. Bleeding is controlled with afrin and direct visualization and cautery here in ED. No anemia on CBC. No coagulopathy. VS improved with reassurance and pain control. Will place on mupirocin for moisturization, afrin PRN, and refer to ENT. Home management of epistaxis reviewed with pt and son in presence of interpreter.  Final Clinical Impressions(s) / ED Diagnoses   Final diagnoses:  Right-sided epistaxis    New Prescriptions New Prescriptions   MUPIROCIN NASAL OINTMENT (BACTROBAN) 2 %    Apply in each nostril twice daily     Shaune Pollack, MD 05/11/16  1252  

## 2016-05-11 NOTE — ED Triage Notes (Addendum)
Pt verbalizes reoccurring right nare bleeding onset 0300; seen here for same.

## 2016-08-27 ENCOUNTER — Ambulatory Visit: Payer: Self-pay | Admitting: Internal Medicine

## 2016-09-06 ENCOUNTER — Encounter: Payer: Self-pay | Admitting: Internal Medicine

## 2016-09-06 ENCOUNTER — Ambulatory Visit: Payer: Self-pay | Attending: Internal Medicine | Admitting: Internal Medicine

## 2016-09-06 VITALS — BP 173/75 | HR 77 | Temp 97.7°F | Resp 16 | Wt 124.0 lb

## 2016-09-06 DIAGNOSIS — R200 Anesthesia of skin: Secondary | ICD-10-CM

## 2016-09-06 DIAGNOSIS — E785 Hyperlipidemia, unspecified: Secondary | ICD-10-CM

## 2016-09-06 DIAGNOSIS — J984 Other disorders of lung: Secondary | ICD-10-CM | POA: Insufficient documentation

## 2016-09-06 DIAGNOSIS — D649 Anemia, unspecified: Secondary | ICD-10-CM

## 2016-09-06 DIAGNOSIS — M546 Pain in thoracic spine: Secondary | ICD-10-CM | POA: Insufficient documentation

## 2016-09-06 DIAGNOSIS — R059 Cough, unspecified: Secondary | ICD-10-CM

## 2016-09-06 DIAGNOSIS — R05 Cough: Secondary | ICD-10-CM | POA: Insufficient documentation

## 2016-09-06 DIAGNOSIS — I10 Essential (primary) hypertension: Secondary | ICD-10-CM

## 2016-09-06 DIAGNOSIS — J849 Interstitial pulmonary disease, unspecified: Secondary | ICD-10-CM

## 2016-09-06 DIAGNOSIS — M549 Dorsalgia, unspecified: Secondary | ICD-10-CM

## 2016-09-06 MED ORDER — GABAPENTIN 100 MG PO CAPS: 200.0000 mg | ORAL_CAPSULE | Freq: Every day | ORAL | 3 refills | Status: DC

## 2016-09-06 MED ORDER — AMLODIPINE BESYLATE 5 MG PO TABS: 5.0000 mg | ORAL_TABLET | Freq: Every day | ORAL | 3 refills | Status: DC

## 2016-09-06 NOTE — Patient Instructions (Signed)
Please give forms for [facility] discount and appt with Ms. Leavy CellaBoyd.  Please sign release of info form and get records from pt's cardiologist.

## 2016-09-06 NOTE — Progress Notes (Signed)
Patient ID: Tammy Osborne, female    DOB: 10/08/1955  MRN: [id number]  CC: New Patient (Initial Visit) and Back Pain   Subjective: Tammy Osborne is a 61 y.o. female who presents for new pt visit. Last saw primary doctor 1 yr ago Her concerns today include:  1. Pt with hx of HTN and HL Followed by Cardiologist Dr. Rinaldo CloudMohan Harwani with [facility]. Reports being told she has a mild inflamation around her heart. Last seen about 2 mths ago.  -on Lis/HCTZ -limits salt in foods -took med already for this a.m  2.  Pt c/o "back ache and one of my legs is numb." -pain just below the RT scapular x 3-4 mths. -started after being dx with RLL pneumonia earlier this yr.  -constant, burning pain.  -worse when she mops/sweeps/cough -takes Ibuprofen once a day which calms it down  + cough more so in a.m. Yellow phlegm.  No SOB or fever.  -no allergy symptoms. Does not smoke -no hx of TB.  No travel out side [place] in past yr. Does house cleaning for a living -treated for RLL pneumonia in 01/2016 with Z-pack then Doxycycline when symptoms did not improve.  Seen in ER again 03/2016 with improving but unresolved cough.  CT chest revealed:  IMPRESSION: Extensive diffuse bilateral ground-glass densities with mosaicism in the bilateral lower lungs. Findings are nonspecific and could be due to small airways disease/bronchiolitis, atypical infection/pneumonia, and interstitial lung disease. There is mild mediastinal adenopathy.  3. Numbness in RT leg x few mths No pain in buttock or lumbar spine just numbness. - No initiating factor -worse when she is on feet.  Better with walking.  "My leg wakes up and I feel less pressure." -no known injury to leg or back -no polyuria or polydipsia -No rash.  No jt pains in LE  Current Outpatient Prescriptions on File Prior to Visit  Medication Sig Dispense Refill  . lisinopril-hydrochlorothiazide (PRINZIDE,ZESTORETIC) 20-12.5 MG tablet Take 1  tablet by mouth 2 (two) times daily.      No current facility-administered medications on file prior to visit.     No Known Allergies  Social History   Social History  . Marital status: Married    Spouse name: N/A  . Number of children: N/A  . Years of education: N/A   Occupational History  . Not on file.   Social History Main Topics  . Smoking status: Never Smoker  . Smokeless tobacco: Never Used  . Alcohol use No  . Drug use: Unknown  . Sexual activity: Not on file   Other Topics Concern  . Not on file   Social History Narrative  . No narrative on file    No family history on file.  Past Surgical History:  Procedure Laterality Date  . ABDOMINAL HYSTERECTOMY      ROS: Review of Systems  Constitutional: Negative for appetite change, fatigue, fever and unexpected weight change.  Respiratory: Positive for cough. Negative for chest tightness, shortness of breath and wheezing.   Cardiovascular: Negative for chest pain.  Musculoskeletal: Positive for back pain. Negative for joint swelling and myalgias.  Neurological: Positive for numbness.  Psychiatric/Behavioral: Negative for dysphoric mood. The patient is not nervous/anxious.     PHYSICAL EXAM: BP (!) 173/75   Pulse 77   Temp 97.7 F (36.5 C) (Oral)   Resp 16   Wt 124 lb (56.2 kg)   LMP  (LMP Unknown)   SpO2 94%   Wt  Readings from Last 3 Encounters:  09/06/16 124 lb (56.2 kg)  05/11/16 125 lb (56.7 kg)  04/06/16 125 lb (56.7 kg)   Physical Exam General appearance - alert, well appearing, older female and in no distress Mental status - alert, oriented to person, place, and time, normal mood, behavior, speech, dress, motor activity, and thought processes.  Poor historian Nose - normal and patent, no erythema, discharge or polyps Mouth - mucous membranes moist, pharynx normal without lesions Neck - supple, no significant adenopathy Chest -positive Velcro crackles bilaterally but more pronounced in the  right lower lung Heart - normal rate, regular rhythm, normal S1, S2, no murmurs, rubs, clicks or gallops Neurological -subjective decrease sensation to gross light touch right lower extremity. Power lower extremities 5/5 bilaterally Musculoskeletal -no point tenderness on palpation of the thoracic paraspinal muscle on the right side Extremities - peripheral pulses normal, no pedal edema, no clubbing or cyanosis  Lab Results  Component Value Date   WBC 14.9 (H) 05/11/2016   HGB 11.7 (L) 05/11/2016   HCT 36.3 05/11/2016   MCV 82.5 05/11/2016   PLT 544 (H) 05/11/2016     Chemistry      Component Value Date/Time   NA 143 04/30/2016 0855   K 3.8 04/30/2016 0855   CL 110 04/30/2016 0855   CO2 24 04/30/2016 0755   BUN 25 (H) 04/30/2016 0855   CREATININE 0.70 04/30/2016 0855      Component Value Date/Time   CALCIUM 8.1 (L) 04/30/2016 0755   ALKPHOS 110 01/30/2016 1138   AST 33 01/30/2016 1138   ALT 21 01/30/2016 1138   BILITOT 0.5 01/30/2016 1138       ASSESSMENT AND PLAN: 1. Essential hypertension Not at goal. DASH diet discussed. Add Norvasc - CBC With Differential - Comprehensive metabolic panel - Lipid panel - amLODipine (NORVASC) 5 MG tablet; Take 1 tablet (5 mg total) by mouth daily.  Dispense: 90 tablet; Refill: 3  2. Numbness of lower limb -Questionable etiology. She denies any lumbar pain or symptoms to suggest sciatica but we will still get an x-ray of the lumbar spine. Blood tests ordered. If both are negative she will need an EMG - Vitamin B12 - Hemoglobin A1c - Sedimentation Rate - gabapentin (NEURONTIN) 100 MG capsule; Take 2 capsules (200 mg total) by mouth at bedtime.  Dispense: 60 capsule; Refill: 3 - DG Lumbar Spine Complete; Future - ANA w/Reflex  3. Upper back pain on right side -Likely neuropathic (given burning character) but also has MSK component given it is worse with movement.  - - gabapentin (NEURONTIN) 100 MG capsule; Take 2 capsules (200 mg  total) by mouth at bedtime.  Dispense: 60 capsule; Refill: 3  4. Interstitial pulmonary disease (HCC) History and CAT scan from March of this year suggests DPLD. Will get high resolution CT, PFTs and Quant Gold test.   -Advised to sign up for [facility] card[facility] as we will probably need to refer to pulmonary - CT Chest High Resolution; Future -PFTs  5. Cough in adult - Quantiferon tb gold assay  6. Anemia, unspecified type - Iron, TIBC and Ferritin Panel  7.  Hyperlipidemia  Patient was given the opportunity to ask questions.  Patient verbalized understanding of the plan and was able to repeat key elements of the plan.   Orders Placed This Encounter  Procedures  . CT Chest High Resolution  . DG Lumbar Spine Complete  . CBC With Differential  . Comprehensive metabolic panel  .  Iron, TIBC and Ferritin Panel  . Lipid panel  . Quantiferon tb gold assay  . Vitamin B12  . Hemoglobin A1c  . Sedimentation Rate  . ANA w/Reflex     Requested Prescriptions   Signed Prescriptions Disp Refills  . amLODipine (NORVASC) 5 MG tablet 90 tablet 3    Sig: Take 1 tablet (5 mg total) by mouth daily.  Marland Kitchen gabapentin (NEURONTIN) 100 MG capsule 60 capsule 3    Sig: Take 2 capsules (200 mg total) by mouth at bedtime.    Return in about 3 weeks (around 09/27/2016).  Jonah Blue, MD, FACP

## 2016-09-07 LAB — CBC WITH DIFFERENTIAL
BASOS ABS: 0 10*3/uL (ref 0.0–0.2)
Basos: 0 %
EOS (ABSOLUTE): 0 10*3/uL (ref 0.0–0.4)
Eos: 0 %
HEMOGLOBIN: 12.8 g/dL (ref 11.1–15.9)
Hematocrit: 39.6 % (ref 34.0–46.6)
IMMATURE GRANULOCYTES: 0 %
Immature Grans (Abs): 0 10*3/uL (ref 0.0–0.1)
LYMPHS ABS: 3 10*3/uL (ref 0.7–3.1)
LYMPHS: 31 %
MCH: 23.5 pg — ABNORMAL LOW (ref 26.6–33.0)
MCHC: 32.3 g/dL (ref 31.5–35.7)
MCV: 73 fL — ABNORMAL LOW (ref 79–97)
MONOCYTES: 7 %
Monocytes Absolute: 0.7 10*3/uL (ref 0.1–0.9)
Neutrophils Absolute: 5.8 10*3/uL (ref 1.4–7.0)
Neutrophils: 62 %
RBC: 5.45 x10E6/uL — AB (ref 3.77–5.28)
RDW: 16.4 % — ABNORMAL HIGH (ref 12.3–15.4)
WBC: 9.6 10*3/uL (ref 3.4–10.8)

## 2016-09-07 LAB — IRON,TIBC AND FERRITIN PANEL
FERRITIN: 35 ng/mL (ref 15–150)
Iron Saturation: 9 % — CL (ref 15–55)
Iron: 34 ug/dL (ref 27–159)
TIBC: 400 ug/dL (ref 250–450)
UIBC: 366 ug/dL (ref 131–425)

## 2016-09-07 LAB — COMPREHENSIVE METABOLIC PANEL
ALBUMIN: 4.9 g/dL — AB (ref 3.6–4.8)
ALK PHOS: 143 IU/L — AB (ref 39–117)
ALT: 18 IU/L (ref 0–32)
AST: 23 IU/L (ref 0–40)
Albumin/Globulin Ratio: 2 (ref 1.2–2.2)
BUN / CREAT RATIO: 26 (ref 12–28)
BUN: 22 mg/dL (ref 8–27)
Bilirubin Total: 0.2 mg/dL (ref 0.0–1.2)
CALCIUM: 10 mg/dL (ref 8.7–10.3)
CO2: 22 mmol/L (ref 20–29)
CREATININE: 0.84 mg/dL (ref 0.57–1.00)
Chloride: 105 mmol/L (ref 96–106)
GFR calc Af Amer: 87 mL/min/{1.73_m2} (ref >59)
GFR calc non Af Amer: 76 mL/min/{1.73_m2} (ref >59)
GLUCOSE: 90 mg/dL (ref 65–99)
Globulin, Total: 2.5 g/dL (ref 1.5–4.5)
Potassium: 4.6 mmol/L (ref 3.5–5.2)
Sodium: 143 mmol/L (ref 134–144)
Total Protein: 7.4 g/dL (ref 6.0–8.5)

## 2016-09-07 LAB — LIPID PANEL
Chol/HDL Ratio: 4.6 ratio — ABNORMAL HIGH (ref 0.0–4.4)
Cholesterol, Total: 167 mg/dL (ref 100–199)
HDL: 36 mg/dL — AB (ref >39)
LDL Calculated: 96 mg/dL (ref 0–99)
Triglycerides: 177 mg/dL — ABNORMAL HIGH (ref 0–149)
VLDL Cholesterol Cal: 35 mg/dL (ref 5–40)

## 2016-09-07 LAB — HEMOGLOBIN A1C
Est. average glucose Bld gHb Est-mCnc: 143 mg/dL
HEMOGLOBIN A1C: 6.6 % — AB (ref 4.8–5.6)

## 2016-09-07 LAB — SEDIMENTATION RATE: Sed Rate: 10 mm/hr (ref 0–40)

## 2016-09-07 LAB — ANA W/REFLEX: Anti Nuclear Antibody(ANA): NEGATIVE

## 2016-09-07 LAB — VITAMIN B12

## 2016-09-09 LAB — QUANTIFERON IN TUBE
QFT TB AG MINUS NIL VALUE: 0.03 [IU]/mL
QUANTIFERON MITOGEN VALUE: >10 IU/mL
QUANTIFERON TB AG VALUE: 0.1 [IU]/mL
QUANTIFERON TB GOLD: NEGATIVE
Quantiferon Nil Value: 0.07 IU/mL

## 2016-09-09 LAB — QUANTIFERON TB GOLD ASSAY (BLOOD)

## 2016-09-14 ENCOUNTER — Ambulatory Visit (HOSPITAL_COMMUNITY)
Admission: RE | Admit: 2016-09-14 | Discharge: 2016-09-14 | Disposition: A | Discharge status: Home / Self Care | Payer: Self-pay | Source: Ambulatory Visit | Attending: Internal Medicine | Admitting: Internal Medicine

## 2016-09-14 DIAGNOSIS — J984 Other disorders of lung: Secondary | ICD-10-CM | POA: Insufficient documentation

## 2016-09-14 DIAGNOSIS — I700 Atherosclerosis of aorta: Secondary | ICD-10-CM | POA: Insufficient documentation

## 2016-09-14 DIAGNOSIS — I2510 Atherosclerotic heart disease of native coronary artery without angina pectoris: Secondary | ICD-10-CM | POA: Insufficient documentation

## 2016-09-14 DIAGNOSIS — J849 Interstitial pulmonary disease, unspecified: Secondary | ICD-10-CM

## 2016-09-23 ENCOUNTER — Telehealth: Payer: Self-pay | Admitting: Internal Medicine

## 2016-09-23 DIAGNOSIS — J849 Interstitial pulmonary disease, unspecified: Secondary | ICD-10-CM

## 2016-09-23 NOTE — Telephone Encounter (Signed)
PC placed to pt using [employer].  Left message on voicemail and cell phone letting her know that I am trying to reach her to go over CT chest results. I will sent pt a letter.   She needs referral to pulmonary which I will go ahead and initiate until I hear back from her.

## 2016-10-15 ENCOUNTER — Institutional Professional Consult (permissible substitution): Payer: Self-pay | Admitting: Pulmonary Disease

## 2016-10-15 ENCOUNTER — Encounter: Payer: Self-pay | Admitting: Pulmonary Disease

## 2016-10-15 ENCOUNTER — Ambulatory Visit (INDEPENDENT_AMBULATORY_CARE_PROVIDER_SITE_OTHER): Payer: Self-pay | Admitting: Pulmonary Disease

## 2016-10-15 ENCOUNTER — Other Ambulatory Visit (INDEPENDENT_AMBULATORY_CARE_PROVIDER_SITE_OTHER): Payer: Self-pay

## 2016-10-15 VITALS — BP 130/66 | HR 87 | Ht 59.0 in | Wt 125.8 lb

## 2016-10-15 DIAGNOSIS — R05 Cough: Secondary | ICD-10-CM

## 2016-10-15 DIAGNOSIS — R059 Cough, unspecified: Secondary | ICD-10-CM

## 2016-10-15 DIAGNOSIS — J849 Interstitial pulmonary disease, unspecified: Secondary | ICD-10-CM

## 2016-10-15 LAB — CBC WITH DIFFERENTIAL/PLATELET
BASOS PCT: 0.5 % (ref 0.0–3.0)
Basophils Absolute: 0.1 10*3/uL (ref 0.0–0.1)
EOS ABS: 0 10*3/uL (ref 0.0–0.7)
Eosinophils Relative: 0 % (ref 0.0–5.0)
HEMATOCRIT: 41.5 % (ref 36.0–46.0)
Hemoglobin: 13.1 g/dL (ref 12.0–15.0)
LYMPHS ABS: 3.9 10*3/uL (ref 0.7–4.0)
LYMPHS PCT: 29.9 % (ref 12.0–46.0)
MCHC: 31.5 g/dL (ref 30.0–36.0)
MCV: 75.6 fl — AB (ref 78.0–100.0)
Monocytes Absolute: 0.9 10*3/uL (ref 0.1–1.0)
Monocytes Relative: 7 % (ref 3.0–12.0)
NEUTROS ABS: 8.1 10*3/uL — AB (ref 1.4–7.7)
NEUTROS PCT: 62.6 % (ref 43.0–77.0)
Platelets: 382 10*3/uL (ref 150.0–400.0)
RBC: 5.49 Mil/uL — ABNORMAL HIGH (ref 3.87–5.11)
RDW: 18 % — AB (ref 11.5–15.5)
WBC: 12.9 10*3/uL — ABNORMAL HIGH (ref 4.0–10.5)

## 2016-10-15 LAB — SEDIMENTATION RATE: Sed Rate: 59 mm/hr — ABNORMAL HIGH (ref 0–30)

## 2016-10-15 LAB — C-REACTIVE PROTEIN: CRP: 0.9 mg/dL (ref 0.5–20.0)

## 2016-10-15 MED ORDER — PREDNISONE 10 MG PO TABS: ORAL_TABLET | ORAL | 0 refills | Status: DC

## 2016-10-15 NOTE — Patient Instructions (Signed)
For your diffuse parenchymal lung disease: I believe that you have an inflammatory condition of the lung We are going to order lab tests to try to get to the bottom of this We will also order something called a lung function test Take prednisone 20 mg daily for 2 weeks followed by 10 mg daily for 2 weeks Follow-up with me or the nurse practitioner in 4 weeks

## 2016-10-15 NOTE — Progress Notes (Signed)
Subjective:    Patient ID: Tammy Osborne, female    DOB: May 06, 1955, 61 y.o.   MRN: [id number]  Synopsis: Referred for ILD in 2018  HPI Chief Complaint  Patient presents with  . Advice Only    referred for ILD.     This is a pleasant 56-year-old female who is a lifelong nonsmoker who comes to my clinic today to establish care for a new finding of diffuse parenchymal lung disease on CT scanning of the chest. She says it for over a year she's had a cough and this is been associated with some mild shortness of breath when climbing hills. When she walks on level ground she doesn't have any dyspnea. However, she's had this sensation of some chest pressure and trouble breathing for the last year. Overall the symptoms are mild and she continues to work on a day-to-day basis in a job that requires a fair amount of exertion. 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 She says that this is been associated with some pain between her shoulder blades in her neck. Her primary complaint is the cough which is rarely productive of yellow mucus, mostly in the mornings, and the back pain. She's never smoked cigarettes. She's only worked in this job for the last 8 years. She moved out of her apartment about a year ago after living there for 7 years. She now lives in a house as a crawl space but no mold or mildew. There is no animals in the home. 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 She does not do this type of work herself.   Past Medical History:  Diagnosis Date  . Hypertension      No family history on file.   Social History   Social History  . Marital status: Married    Spouse name: N/A  . Number of children: N/A  . Years of education: N/A   Occupational History  . Not on file.   Social History Main Topics  . Smoking status: Passive Smoke Exposure - Never Smoker  . Smokeless tobacco: Never Used     Comment: spouse smoked in home X6-7  years.   . Alcohol use No  . Drug use: Unknown  . Sexual activity: Not on file   Other Topics Concern  . Not on file   Social History Narrative  . No narrative on file     No Known Allergies   Outpatient Medications Prior to Visit  Medication Sig Dispense Refill  . amLODipine (NORVASC) 5 MG tablet Take 1 tablet (5 mg total) by mouth daily. 90 tablet 3  . atorvastatin (LIPITOR) 40 MG tablet Take 40 mg by mouth daily.    Marland Kitchen gabapentin (NEURONTIN) 100 MG capsule Take 2 capsules (200 mg total) by mouth at bedtime. 60 capsule 3  . lisinopril-hydrochlorothiazide (PRINZIDE,ZESTORETIC) 20-12.5 MG tablet Take 1 tablet by mouth 2 (two) times daily.      No facility-administered medications prior to visit.       Review of Systems  Constitutional: Negative for fever and unexpected weight change.  HENT: Positive for congestion. Negative for dental problem, ear pain, nosebleeds, postnasal drip, rhinorrhea, sinus pressure, sneezing, sore throat and trouble swallowing.   Eyes: Negative for redness and itching.  Respiratory: Positive for cough, chest tightness, shortness of breath and wheezing.   Cardiovascular: Negative for palpitations and leg swelling.  Gastrointestinal: Negative for nausea and vomiting.  Genitourinary: Negative  for dysuria.  Musculoskeletal: Negative for joint swelling.  Skin: Negative for rash.  Neurological: Negative for headaches.  Hematological: Does not bruise/bleed easily.  Psychiatric/Behavioral: Negative for dysphoric mood. The patient is not nervous/anxious.        Objective:   Physical Exam  Vitals:   10/15/16 1432  BP: 130/66  Pulse: 87  SpO2: 94%  Weight: 125 lb 12.8 oz (57.1 kg)  Height:  (1.499 m)   Gen: well appearing, no acute distress HENT: NCAT, OP clear, neck supple without masses Eyes: PERRL, EOMi Lymph: no cervical lymphadenopathy PULM: wheezing with inspiration bilaterally, prolonged exhalation B CV: RRR, no mgr, no JVD GI:  BS+, soft, nontender, no hsm Derm: no rash or skin breakdown MSK: normal bulk and tone, clubbing noted Neuro: A&Ox4, CN II-XII intact, strength 5/5 in all 4 extremities Psyche: normal mood and affect   CBC    Component Value Date/Time   WBC 9.6 09/06/2016 1036   WBC 14.9 (H) 05/11/2016 1051   RBC 5.45 (H) 09/06/2016 1036   RBC 4.40 05/11/2016 1051   HGB 12.8 09/06/2016 1036   HCT 39.6 09/06/2016 1036   PLT 544 (H) 05/11/2016 1051   MCV 73 (L) 09/06/2016 1036   MCH 23.5 (L) 09/06/2016 1036   MCH 26.6 05/11/2016 1051   MCHC 32.3 09/06/2016 1036   MCHC 32.2 05/11/2016 1051   RDW 16.4 (H) 09/06/2016 1036   LYMPHSABS 3.0 09/06/2016 1036   MONOABS 0.8 04/30/2016 0755   EOSABS 0.0 09/06/2016 1036   BASOSABS 0.0 09/06/2016 1036    BMET    Component Value Date/Time   NA 143 09/06/2016 1036   K 4.6 09/06/2016 1036   CL 105 09/06/2016 1036   CO2 22 09/06/2016 1036   GLUCOSE 90 09/06/2016 1036   GLUCOSE 109 (H) 04/30/2016 0855   BUN 22 09/06/2016 1036   CREATININE 0.84 09/06/2016 1036   CALCIUM 10.0 09/06/2016 1036   GFRNONAA 76 09/06/2016 1036   GFRAA 87 09/06/2016 1036   Chest imaging: August 2018 CT chest images independently reviewed showing diffuse groundglass bilaterally and a bronco vascular distribution with clear air trapping. No pulmonary parenchymal fibrosis seen.      Assessment & Plan:  Interstitial pulmonary disease (HCC) - Plan: Pulmonary function test, ANA, C-reactive protein, Sedimentation rate, Rheumatoid factor, Anti-Jo 1 antibody, IgG, Aldolase, Sjogrens syndrome-A extractable nuclear antibody, Hypersensitivity pnuemonitis profile, Sjogrens syndrome-B extractable nuclear antibody, Sjogrens syndrome-A extractable nuclear antibody, Anti-scleroderma antibody, Centromere Antibodies  Cough  Discussion: She has a finding of diffuse parenchymal lung disease on the CT scan of her chest. Of independently reviewed this most recent CT scan of the chest today. There  is groundglass in a bronchovascular distribution with air trapping. Objectively she also has wheezing on exam. I suppose this could be as simple as some sort of asthma but given the groundglass with air trapping that she sees it raises suspicion for a bronchiolitis type syndrome or hypersensitivity pneumonitis.  I presume that her cough is related to the lung disease, though it should be noted that she's on an ACE inhibitor. She denies any acid reflux symptoms  I'm going to put her on a low-dose of prednisone to see if this helps with her symptoms. When she returns. She has evidence of airflow obstruction on her lung function test then I think she should be put on a combination long acting beta agonist and inhaled corticosteroid medicine. However if there is no evidence of airflow obstruction and she has significant  improvement with prednisone then we'll need to consider whether or not to consider an open lung biopsy or continue empiric anti-inflammatory treatment.  Plan: For your diffuse parenchymal lung disease: I believe that you have an inflammatory condition of the lung We are going to order lab tests to try to get to the bottom of this We will also order something called a lung function test Take prednisone 20 mg daily for 2 weeks followed by 10 mg daily for 2 weeks Follow-up with me or the nurse practitioner in 4 weeks    Current Outpatient Prescriptions:  .  amLODipine (NORVASC) 5 MG tablet, Take 1 tablet (5 mg total) by mouth daily., Disp: 90 tablet, Rfl: 3 .  atorvastatin (LIPITOR) 40 MG tablet, Take 40 mg by mouth daily., Disp: , Rfl:  .  gabapentin (NEURONTIN) 100 MG capsule, Take 2 capsules (200 mg total) by mouth at bedtime., Disp: 60 capsule, Rfl: 3 .  lisinopril-hydrochlorothiazide (PRINZIDE,ZESTORETIC) 20-12.5 MG tablet, Take 1 tablet by mouth 2 (two) times daily. , Disp: , Rfl:

## 2016-10-16 LAB — CENTROMERE ANTIBODIES: CENTROMERE AB SCREEN: NEGATIVE AI

## 2016-10-16 LAB — ANTI-JO 1 ANTIBODY, IGG: Anti JO-1: <0.2 AI (ref 0.0–0.9)

## 2016-10-16 LAB — SJOGRENS SYNDROME-A EXTRACTABLE NUCLEAR ANTIBODY: SSA (Ro) (ENA) Antibody, IgG: <1 AI

## 2016-10-17 ENCOUNTER — Ambulatory Visit (INDEPENDENT_AMBULATORY_CARE_PROVIDER_SITE_OTHER): Payer: Self-pay | Admitting: Pulmonary Disease

## 2016-10-17 DIAGNOSIS — J849 Interstitial pulmonary disease, unspecified: Secondary | ICD-10-CM

## 2016-10-17 NOTE — Progress Notes (Signed)
PFT was attempted today but could not be completed due to the language barrier. I attempted the test with her daughter interpreting and was unsuccessful. I then attempted the test with the video interpreter and was still unsuccessful. Total attempt time was 30 mins.

## 2016-10-19 ENCOUNTER — Ambulatory Visit: Payer: Self-pay | Attending: Internal Medicine | Admitting: Internal Medicine

## 2016-10-19 ENCOUNTER — Encounter: Payer: Self-pay | Admitting: Internal Medicine

## 2016-10-19 VITALS — BP 175/79 | HR 79 | Temp 98.2°F | Resp 16 | Wt 127.4 lb

## 2016-10-19 DIAGNOSIS — Z90710 Acquired absence of both cervix and uterus: Secondary | ICD-10-CM | POA: Insufficient documentation

## 2016-10-19 DIAGNOSIS — E119 Type 2 diabetes mellitus without complications: Secondary | ICD-10-CM

## 2016-10-19 DIAGNOSIS — E785 Hyperlipidemia, unspecified: Secondary | ICD-10-CM | POA: Insufficient documentation

## 2016-10-19 DIAGNOSIS — D509 Iron deficiency anemia, unspecified: Secondary | ICD-10-CM

## 2016-10-19 DIAGNOSIS — J849 Interstitial pulmonary disease, unspecified: Secondary | ICD-10-CM

## 2016-10-19 DIAGNOSIS — Z7722 Contact with and (suspected) exposure to environmental tobacco smoke (acute) (chronic): Secondary | ICD-10-CM | POA: Insufficient documentation

## 2016-10-19 DIAGNOSIS — D649 Anemia, unspecified: Secondary | ICD-10-CM | POA: Insufficient documentation

## 2016-10-19 DIAGNOSIS — E118 Type 2 diabetes mellitus with unspecified complications: Secondary | ICD-10-CM | POA: Insufficient documentation

## 2016-10-19 DIAGNOSIS — Z7951 Long term (current) use of inhaled steroids: Secondary | ICD-10-CM | POA: Insufficient documentation

## 2016-10-19 DIAGNOSIS — I10 Essential (primary) hypertension: Secondary | ICD-10-CM

## 2016-10-19 DIAGNOSIS — E1142 Type 2 diabetes mellitus with diabetic polyneuropathy: Secondary | ICD-10-CM | POA: Insufficient documentation

## 2016-10-19 DIAGNOSIS — Z23 Encounter for immunization: Secondary | ICD-10-CM

## 2016-10-19 DIAGNOSIS — Z79899 Other long term (current) drug therapy: Secondary | ICD-10-CM | POA: Insufficient documentation

## 2016-10-19 MED ORDER — METFORMIN HCL 500 MG PO TABS: 500.0000 mg | ORAL_TABLET | Freq: Every day | ORAL | 3 refills | Status: DC

## 2016-10-19 MED ORDER — FERROUS SULFATE 325 (65 FE) MG PO TABS: 325.0000 mg | ORAL_TABLET | Freq: Every day | ORAL | 0 refills | Status: DC

## 2016-10-19 MED ORDER — AMLODIPINE BESYLATE 10 MG PO TABS: 10.0000 mg | ORAL_TABLET | Freq: Every day | ORAL | 6 refills | Status: DC

## 2016-10-19 NOTE — Progress Notes (Signed)
Patient ID: Tammy Osborne, female    DOB: Oct 31, 1955  MRN: [id number]  CC: Follow-up   Subjective: Tammy Osborne is a 61 y.o. female who presents for 1 mth f/u Her concerns today include:  Patient with history of hypertension, newly diagnosed diabetes, ILD, HL, anemia  1. ILD: -Since last visit with me she has seen the pulmonologist Dr. Kendrick Fries. 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 Given tapering course of prednisone. Patient feels her breathing is better and cough has decreased since being on the prednisone. Neck supple and with him is 10/15  2. New diabetes. A1c on blood tests was 6.6. Reports being told by her cardiologist in the past that she was on the borderline. -Denies blurred vision polyurea/polydipsia. -Numbness in right leg is much better with gabapentin. Vitamin B12 level was normal  3. Blood tests revealed iron deficiency anemia. -Never had colonoscopy. -No blood in the stools. Lab Results  Component Value Date   IRON 34 09/06/2016   TIBC 400 09/06/2016   FERRITIN 35 09/06/2016   4. HTN: Compliant with medications and salt restriction No chest pains or lower extremity edema. Patient Active Problem List   Diagnosis Date Noted  . Essential hypertension 09/06/2016  . Interstitial pulmonary disease (HCC) 09/06/2016  . Cough in adult 09/06/2016  . Anemia 09/06/2016  . Hyperlipidemia 09/06/2016  . Numbness of lower limb 09/06/2016     Current Outpatient Prescriptions on File Prior to Visit  Medication Sig Dispense Refill  . atorvastatin (LIPITOR) 40 MG tablet Take 40 mg by mouth daily.    Marland Kitchen gabapentin (NEURONTIN) 100 MG capsule Take 2 capsules (200 mg total) by mouth at bedtime. 60 capsule 3  . lisinopril-hydrochlorothiazide (PRINZIDE,ZESTORETIC) 20-12.5 MG tablet Take 1 tablet by mouth 2 (two) times daily.     . predniSONE (DELTASONE) 10 MG tablet Take 2 tabs daily X1 week, then 1 tab daily X1 week. 21  tablet 0   No current facility-administered medications on file prior to visit.     No Known Allergies  Social History   Social History  . Marital status: Married    Spouse name: N/A  . Number of children: N/A  . Years of education: N/A   Occupational History  . Not on file.   Social History Main Topics  . Smoking status: Passive Smoke Exposure - Never Smoker  . Smokeless tobacco: Never Used     Comment: spouse smoked in home X6-7 years.   . Alcohol use No  . Drug use: Unknown  . Sexual activity: Not on file   Other Topics Concern  . Not on file   Social History Narrative  . No narrative on file    No family history on file.  Past Surgical History:  Procedure Laterality Date  . ABDOMINAL HYSTERECTOMY      ROS: Review of Systems  PHYSICAL EXAM: BP (!) 175/79   Pulse 79   Temp 98.2 F (36.8 C) (Oral)   Resp 16   Wt 127 lb 6.4 oz (57.8 kg)   LMP  (LMP Unknown)   SpO2 97%   BMI 25.73 kg/m   Wt Readings from Last 3 Encounters:  10/19/16 127 lb 6.4 oz (57.8 kg)  10/15/16 125 lb 12.8 oz (57.1 kg)  09/06/16 124 lb (56.2 kg)    Physical Exam  General appearance - alert, well appearing, and in no distress Mental status - alert, oriented to person, place, and time, normal mood, behavior,  speech, dress, motor activity, and thought processes Neck - supple, no significant adenopathy Chest -lungs sound better with less wheezing and crackles compared to last visit Heart - normal rate, regular rhythm, normal S1, S2, no murmurs, rubs, clicks or gallops Extremities - peripheral pulses normal, no pedal edema, no clubbing or cyanosis   Lab Results  Component Value Date   IRON 34 09/06/2016   TIBC 400 09/06/2016   FERRITIN 35 09/06/2016    ASSESSMENT AND PLAN: 1. Essential hypertension Not at goal Increase amlodipine to 10 mg. Continue DASH diet. - amLODipine (NORVASC) 10 MG tablet; Take 1 tablet (10 mg total) by mouth daily.  Dispense: 30 tablet; Refill:  6  2. New onset type 2 diabetes mellitus (HCC) -Discussed importance of healthy eating and regular exercise. Printed information given on diabetes  Start low-dose metformin - metFORMIN (GLUCOPHAGE) 500 MG tablet; Take 1 tablet (500 mg total) by mouth daily with breakfast.  Dispense: 90 tablet; Refill: 3  3. Iron deficiency anemia, unspecified iron deficiency anemia type - take iron daily for about 2 months and then stop Encouraged to sign up for the [facility] card/cone discount. Can refer for colonoscopy at that time - ferrous sulfate (FERROUSUL) 325 (65 FE) MG tablet; Take 1 tablet (325 mg total) by mouth daily with breakfast.  Dispense: 60 tablet; Refill: 0  4. ILD (interstitial lung disease) (HCC) -Followed by pulmonary. Symptomatically better on prednisone  5. Need for Streptococcus pneumoniae and influenza vaccination - Flu Vaccine QUAD 6+ mos PF IM (Fluarix Quad PF) - Pneumococcal polysaccharide vaccine 23-valent greater than or equal to 2yo subcutaneous/IM .  Patient was given the opportunity to ask questions.  Patient verbalized understanding of the plan and was able to repeat key elements of the plan.   Orders Placed This Encounter  Procedures  . Flu Vaccine QUAD 6+ mos PF IM (Fluarix Quad PF)  . Pneumococcal polysaccharide vaccine 23-valent greater than or equal to 2yo subcutaneous/IM     Requested Prescriptions   Signed Prescriptions Disp Refills  . metFORMIN (GLUCOPHAGE) 500 MG tablet 90 tablet 3    Sig: Take 1 tablet (500 mg total) by mouth daily with breakfast.  . ferrous sulfate (FERROUSUL) 325 (65 FE) MG tablet 60 tablet 0    Sig: Take 1 tablet (325 mg total) by mouth daily with breakfast.  . amLODipine (NORVASC) 10 MG tablet 30 tablet 6    Sig: Take 1 tablet (10 mg total) by mouth daily.    Return in about 2 months (around 12/19/2016).  Jonah Blue, MD, FACP

## 2016-10-19 NOTE — Patient Instructions (Addendum)
La diabetes mellitus y las normas básicas de atención médica  Diabetes Mellitus and Standards of Medical Care  Tratar la diabetes (diabetes mellitus) puede ser complicado. Su tratamiento de la diabetes puede ser administrado por un equipo de profesionales de la salud, que incluye:  · Un especialista en dietas y nutrición (nutricionista registrado).  · Un enfermero.  · Un educador certificado para el cuidado de la diabetes.  · Un especialista en diabetes (endocrinólogo).  · Un oculista.  · Un médico de cabecera.  · Un dentista.    Sus médicos siguen un programa para ayudarle a obtener la mejor calidad en atención. El programa siguiente es una guía general para su plan de control de la diabetes. Sus médicos también podrán darle instrucciones más específicas.  Análisis de HbA1c (  hemoglobina A1c)  Este análisis proporciona información sobre el control de la glucemia (glucosa en la sangre) durante los últimos 2 o 3 meses. Se utiliza para verificar si el plan de control de la diabetes debe ser modificado.  · Si cumple los objetivos del tratamiento, este análisis se realiza al menos dos veces al año.  · Si no cumple los objetivos del tratamiento o si sus objetivos han cambiado, este análisis se realiza cuatro veces al año.    Control de la presión arterial  · Este control se realiza en cada visita médica de rutina. Para la mayoría de las personas, la meta es menos que 130/80. Consúltele a su médico cuál es su meta para la presión arterial.  Exámenes dentales y oculares  · Visite al dentista dos veces por año.  · Si tiene diabetes tipo 1, hágase un examen ocular en el término de 3 a 5 años después del diagnóstico y, luego, una vez al año después del primer examen.  ? Si le diagnosticaron diabetes tipo 1 siendo un niño, debe hacerse estudios al llegar a los 10 años o más y si ha tenido diabetes durante un período de 3 a 5 años. Después del primer examen, debe hacerse un examen ocular todos los años.   · Si tiene diabetes tipo 2, hágase un examen ocular tan pronto como le diagnostiquen la enfermedad y, luego, una vez por año después del primer examen.  Examen de los pies  · Una inspección visual de pies se hace en cada visita médica de rutina. En este examen se buscan cortes, moretones, enrojecimiento, ampollas, llagas u otros problemas en los pies.  · Su médico le realizará un examen de pies completo una vez por año. Este examen incluye revisar la estructura y la piel de los pies, y examinar los pulsos y la sensación de los pies.  ? Diabetes tipo 1: Hágase su primer examen en el término de 3 a 5 años después del diagnóstico.  ? Diabetes tipo 2: Hágase su primer examen tan pronto como le diagnostiquen la enfermedad.  · Examínese a diario los pies en busca de cortes, moretones, enrojecimiento, ampollas o llagas. Si tiene alguno de estos u otros problemas y no se curan, póngase en contacto con su médico.  Estudio de la función renal (  microalbúmina en orina)  · Este estudio se realiza una vez por año.  ? Diabetes tipo 1: Hágase su primer estudio cinco años después del diagnóstico.  ? Diabetes tipo 2: Hágase su primer estudio tan pronto como le diagnostiquen la enfermedad.  · Si tiene enfermedad renal crónica, hágase un examen de creatinina sérica y de índice de filtración glomerular estimada (eGFR, por sus siglas en inglés) una vez por año.    realizar este examen cada ao. ? En relacin al LDL, el objetivo es tener menos de 174m/dl (5,5 mmol/l). Si tiene aUnited Parcel el objetivo es tener menos de 70 mg/dl (3,9 mmol/l). ? En relacin al HDL, el objetivo es tener 40 mg/dl (2,2 mmol/l) para los hombres y 50 mg/dl (2,8 mmol/l) para las mujeres. Un nivel de colesterol HDL de 60 mg/dl (3,3 mmol/l) o  superior da una cierta proteccin contra la enfermedad cardaca. ? En relacin a los triglicridos, el objetivo es tener menos de 150 mg/dl (8,3 mmol/l). Vacunas  Se recomienda aplicar de forma anual la vacuna contra la gripe (influenza) a todas las personas de 64mes en adelante que tengan diabetes.  La vacuna contra la neumona (antineumoccica) est recomendada para todas las personas de 2aos en adelante que tengan diabetes. Si tiene 65aos o ms, puede recibir laEngineer, manufacturingomo una serie de dos inyecciones diP[place] Se recomienda administrar la vacuna contra la hepatitisB en adultos poco despus de que hayan recibido el diagnstico de diabetes.  La vacuna Tdap (contra el ttanos, la difteria y la tosferina) debe aplicarse de la siguiente manera: ? Segn las pautas normales de vacunacin infantil en el caso de los nios. ? Cada 10aos en el caso de los adultos con diabetes.  La vacuna contra la culebrilla (herpes zster) se recomienda en personas que han tenido varicela y que tiene 5081os de edad o ms. Salud mental y emocional  Se recomienda reOptometristontroles para deHydrographic surveyorntomas de trastornos de laYouth workeransiedad y depresin en el momento del diagnstico, y en una etapa posterior segn sea necesario. Si los controles revelan la presencia de sntomas (el resultado de los controles es positivo), es posible que deba someterse a evaluaciones posteriores y lo deriven a un mdico esRegulatory affairs officerEducacin para el autocontrol de la diabetes  Es recomendable que se informe sobre cmo controlar su diabetes en el momento de recibir el diagnstico y luFillmoresegn sea necesario. Plan de tratamiento  Su plan de tratamiento ser revisado en cada visita mdica. Resumen  Tratar la diabetes (diabetes mellitus) puede ser complicado. Su tratamiento de la diabetes puede ser administrado por un equipo de profesionales de laTechnical sales engineer Sus mdicos  siguen un programa para ayudarle a obEngineer, productionalidad en atencin.  Las normas asistenciales incluyen realizarse, regularmente, exmenes fsicos, anlisis de sangre y controles de la presin arterial, aplicarse vacunas, someterse a estudios de control e informarse sobre cmo controlar su diabetes.  Sus mdicos tambin podrn darle instrucciones ms especficas basndose en su salud. Esta informacin no tiene coMarine scientistl consejo del mdico. Asegrese de hacerle al mdico cualquier pregunta que tenga. Document Released: 04/11/2009 Document Revised: 12/28/2015 Document Reviewed: 06/17/2012 Elsevier Interactive Patient Education  2018 ElEmporiantineumoccica de polisacridos: Lo que debe saber (Pneumococcal Polysaccharide Vaccine: What You Need to Know) 1. Por qu vacunarse? La vacunacin puede proteger a los adAnadarko Petroleum Corporationy a algunos nios y adultos ms jvenes) de la enfermedad neumoccica. La enfermedad neumoccica es causada por una bacteria que puede contagiarse de unMexicoersona a otra por contacto directo. Puede provocar infecciones en los odos y tambin infecciones ms graves en:  los pulmones (neumona),  la sangre (bacteriemia), y  las membranas que cubren el cerebro y la mdula espinal (meningitis). La meningitis puede causar sordera y dao cerebral, y puede ser mortal. Cualquier persona puede contraer la enfermedad neumoccica, pero los nios menores de 2 aos, las personas  con determinadas enfermedades, los adultos mayores de 17 aos y los fumadores son los que corren un mayor riesgo. Cada ao, mueren alrededor de [employer] a causa de la enfermedad [employer] [place]. El tratamiento de las infecciones neumoccicas con penicilina y otros medicamentos era ms efectivo en el pasado. Sin embargo, algunas cepas de la bacteria que causa la enfermedad se han vuelto resistentes a estos medicamentos. Esto hace que la prevencin de la  enfermedad mediante la vacunacin sea an ms importante. 2. Edward Jolly antineumoccica de polisacridos (PPSV23) La vacuna antineumoccica de polisacridos (PPSV23) protege contra 23tipos de bacterias neumoccicas. No previene todas las enfermedades neumoccicas. La vacuna PPSV23 se recomienda para las siguientes personas:  Todos los adultos de 65aos en adelante.  Cleora Fleet persona de 2a 64aos que tenga un problema de salud a Barrister's clerk.  [employer] Corporation de 2 a 46 aos que tengan el sistema inmunitario dbil.  Los adultos de 19 a 64 aos que fumen o tengan asma. [place] solo necesitan una dosis de la vacuna PPSV. Para ciertos grupos de alto riesgo se recomienda una segunda dosis. Las [employer] de 65 aos deben recibir una dosis de la vacuna, aun si recibieron una o ms dosis antes de [employer]. El mdico puede brindarle ms informacin sobre estas recomendaciones. La mayora de los adultos sanos adquiere proteccin 2 a 3semanas despus de haber recibido la vacuna. 3. Algunas personas no deben recibir la vacuna  Cleora Fleet persona que haya sufrido una reaccin alrgica potencialmente mortal a la PPSV no debe recibir otra dosis.  Las personas que tengan Buyer, retail grave a los componentes de la vacuna PPSV no deben recibirla. Informe a su mdico si sufre de algn tipo de alergia grave.  Cleora Fleet persona que est enferma en un grado moderado o grave el da en que est programada la vacunacin, probablemente deba esperar a recuperarse para recibirla. Por lo general, una persona con una enfermedad leve puede vacunarse.  Los nios menores de 2 aos no deben recibir [employer].  No hay pruebas de que la vacuna PPSV sea perjudicial para las mujeres embarazadas o el feto. No obstante, como precaucin, las mujeres que necesiten aplicarse la vacuna deben hacerlo antes de quedar embarazadas, si es posible.  4. [place] de [place] reaccin a la vacuna Con cualquier medicamento,  incluyendo las vacunas, existe la posibilidad de que aparezcan efectos secundarios. Estos son leves y desaparecen por s solos, pero tambin son posibles las reacciones graves. Aproximadamente la mitad de las personas que reciben la PPSV presentan efectos secundarios leves, como enrojecimiento o Management consultant donde se aplic la vacuna, que desaparecen a [place]. Menos de 1 de cada 100personas presenta fiebre, dolores musculares o reacciones locales ms graves. Problemas que podran ocurrir despus de cualquier vacuna:  Las personas a veces se desmayan despus de un procedimiento mdico, incluso despus de recibir Engineer, drilling. Si permanece sentado o recostado durante 15 minutos puede ayudar a [employer]ill Lynch y las lesiones causadas por las cadas. Informe al mdico si se siente mareado, tiene cambios en la visin o zumbidos en los odos.  Algunas personas sienten un dolor intenso en el hombro y tienen dificultad para mover el brazo donde se coloc la vacuna. Esto sucede con muy poca frecuencia.  Cualquier medicamento puede causar una reaccin alrgica grave. Dichas reacciones son Orlene Erm poco frecuentes con una vacuna (se calcula que menos de 1en un milln de dosis) y se producen unos  minutos a unas horas despus de la vacunacin. Al igual que con cualquier [employer], existe una probabilidad remota de que una vacuna cause una lesin grave o la [place]. Se controla permanentemente la seguridad de las vacunas. Para obtener ms informacin, visite: http://www.aguilar.org/. 5. Qu pasa si hay una reaccin grave? A qu signos debo estar atento? Observe todo lo que le preocupe, como signos de una reaccin alrgica grave, fiebre muy alta o comportamiento fuera de lo normal. Los signos de una reaccin alrgica grave pueden incluir ronchas, hinchazn de la cara y la garganta, dificultad para respirar, latidos cardacos acelerados, mareos y debilidad. Generalmente, estos comenzaran entre unos  pocos minutos y algunas horas despus de la vacunacin. Qu debo hacer? Si usted piensa que se trata de una reaccin alrgica grave o de otra emergencia que no puede esperar, llame al 911 o dirjase al hospital ms cercano. Sino, llame a su mdico. Despus, la reaccin debe informarse al Sistema de Informacin sobre Efectos Adversos de las [place] (Vaccine Adverse Event Reporting System, VAERS). Su mdico puede presentar este informe, o puede hacerlo usted mismo a travs del sitio web de VAERS, en [email or web address].es, o llamando al (817)823-1137. VAERS no brinda recomendaciones mdicas. 6. Cmo puedo obtener ms informacin?  Consulte a su mdico. Este puede darle el prospecto de la vacuna o recomendarle otras fuentes de informacin.  Comunquese con el servicio de salud de su localidad o su estado.  Comunquese con los Centros para Building surveyor y la Prevencin de Probation officer for Disease Control and Prevention , CDC). ? Llame al 567-092-7524 (1-800-CDC-INFO), o ? Visite el sitio Biomedical engineer en http://hunter.com/. Declaracin de informacin sobre la vacuna antineumoccica de polisacridos de los CDC (05/22/13) Esta informacin no tiene Marine scientist el consejo del mdico. Asegrese de hacerle al mdico cualquier pregunta que tenga. Document Released: 04/13/2008 Document Revised: 02/05/2014 Document Reviewed: 05/25/2013 Elsevier Interactive Patient Education  2017 [place]. Influenza Virus Vaccine injection (Fluarix) Qu es este medicamento? La VACUNA ANTIGRIPAL ayuda a disminuir el riesgo de contraer la influenza, tambin conocida como la gripe. La vacuna solo ayuda a protegerle contra algunas cepas de influenza. Esta vacuna no ayuda a reducir Catering manager de contraer influenza pandmica H1N1. Este medicamento puede ser utilizado para otros usos; si tiene alguna pregunta consulte con su proveedor de atencin mdica o con su farmacutico. MARCAS COMUNES: Fluarix,  Fluzone Qu le debo informar a mi profesional de la salud antes de tomar este medicamento? Necesita saber si usted presenta alguno de los siguientes problemas o situaciones: -trastorno de sangrado como hemofilia -fiebre o infeccin -sndrome de Guillain-Barre u otros problemas neurolgicos -problemas del sistema inmunolgico -infeccin por el virus de la inmunodeficiencia humana (VIH) o SIDA -niveles bajos de plaquetas en la sangre -esclerosis mltiple -una Risk analyst o inusual a las vacunas antigripales, a los huevos, protenas de pollo, al ltex, a la gentamicina, a otros medicamentos, alimentos, colorantes o conservantes -si est embarazada o buscando quedar embarazada -si est amamantando a un beb Cmo debo utilizar este medicamento? Esta vacuna se administra mediante inyeccin por va intramuscular. Lo administra un profesional de [employer]	[place]. Recibir una copia de informacin escrita sobre la vacuna antes de cada vacuna. Asegrese de leer este folleto cada vez cuidadosamente. Este folleto puede cambiar con frecuencia. Hable con su pediatra para informarse acerca del uso de este medicamento en nios. Puede requerir atencin especial. Sobredosis: Pngase en contacto inmediatamente con un centro toxicolgico o una sala de urgencia si usted cree que haya  tomado demasiado medicamento. ATENCIN: [employer]ra Foods es solo para usted. No comparta este medicamento con nadie. Qu sucede si me olvido de una dosis? No se aplica en este caso. Qu puede interactuar con este medicamento? -quimioterapia o radioterapia -medicamentos que suprimen el sistema inmunolgico, tales como etanercept, anakinra, infliximab y adalimumab -medicamentos que tratan o previenen cogulos sanguneos, como warfarina -fenitona -medicamentos esteroideos, como la prednisona o la cortisona -teofilina -vacunas Puede ser que esta lista no menciona todas las posibles interacciones. Informe a su profesional de Starbucks Corporation de [employer] Corporation productos a base de hierbas, medicamentos de [place] o suplementos nutritivos que est tomando. Si usted fuma, consume bebidas alcohlicas o si utiliza drogas ilegales, indqueselo tambin a su profesional de [employer]	[place]. Algunas sustancias pueden interactuar con su medicamento. A qu debo estar atento al usar [employer]? Informe a su mdico o a Barrister's clerk de la [employer] todos los efectos secundarios que persistan despus de 3 das. Llame a su proveedor de atencin mdica si se presentan sntomas inusuales dentro de las 6 semanas posteriores a la vacunacin. Es posible que todava pueda contraer la gripe, pero la enfermedad no ser tan fuerte como normalmente. No puede contraer la gripe de esta vacuna. La vacuna antigripal no le protege contra resfros u otras enfermedades que pueden causar [place]. Debe vacunarse cada ao. Qu efectos secundarios puedo tener al [employer] este medicamento? Efectos secundarios que debe informar a su mdico o a Barrister's clerk de la salud tan pronto como sea posible: -reacciones alrgicas como erupcin cutnea, picazn o urticarias, hinchazn de la cara, labios o lengua Efectos secundarios que, por lo general, no requieren atencin mdica (debe informarlos a su mdico o a su profesional de la salud si persisten o si son molestos): -fiebre -dolor de cabeza -molestias y dolores musculares -dolor, sensibilidad, enrojecimiento o Estate agent de la inyeccin -cansancio o debilidad Puede ser que esta lista no menciona todos los posibles efectos secundarios. Comunquese a su mdico por asesoramiento mdico [employer] Inc. Usted puede informar los efectos secundarios a la FDA por telfono al 1-800-FDA-1088. Dnde debo guardar mi medicina? Esta vacuna se administra solamente en clnicas, farmacias, consultorio mdico u otro consultorio de un profesional de la salud y no Sports coach en su domicilio. ATENCIN: Este  folleto es un resumen. Puede ser que no cubra toda la posible informacin. Si usted tiene preguntas acerca de esta medicina, consulte con su mdico, su farmacutico o su profesional de Technical sales engineer.  2018 Elsevier/Gold Standard (2009-07-19 15:31:40)

## 2016-10-20 LAB — ANA: ANA: NEGATIVE

## 2016-10-20 LAB — HYPERSENSITIVITY PNUEMONITIS PROFILE
ASPERGILLUS FUMIGATUS: NEGATIVE
FAENIA RETIVIRGULA: NEGATIVE
Pigeon Serum: NEGATIVE
S. VIRIDIS: NEGATIVE
T. CANDIDUS: NEGATIVE
T. VULGARIS: NEGATIVE

## 2016-10-20 LAB — RHEUMATOID FACTOR: Rhuematoid fact SerPl-aCnc: <14 IU/mL (ref <14)

## 2016-10-20 LAB — ALDOLASE: Aldolase: 5 U/L (ref <=8.1)

## 2016-10-20 LAB — SJOGRENS SYNDROME-B EXTRACTABLE NUCLEAR ANTIBODY: SSB (La) (ENA) Antibody, IgG: <1 AI

## 2016-10-20 LAB — ANTI-SCLERODERMA ANTIBODY: SCLERODERMA (SCL-70) (ENA) ANTIBODY, IGG: NEGATIVE AI

## 2016-10-20 LAB — SJOGRENS SYNDROME-A EXTRACTABLE NUCLEAR ANTIBODY: SSA (Ro) (ENA) Antibody, IgG: <1 AI

## 2016-11-12 ENCOUNTER — Encounter: Payer: Self-pay | Admitting: Acute Care

## 2016-11-12 ENCOUNTER — Ambulatory Visit (INDEPENDENT_AMBULATORY_CARE_PROVIDER_SITE_OTHER): Payer: Self-pay | Admitting: Acute Care

## 2016-11-12 DIAGNOSIS — J849 Interstitial pulmonary disease, unspecified: Secondary | ICD-10-CM

## 2016-11-12 NOTE — Assessment & Plan Note (Signed)
Improvement after treatment with prednisone Unable to complete PFT while acutely ill Plan: We will repeat your PFT now that your cough is gone. Return in 1 week or after PFT's are completed to determine if we need to try an inhaler. ( With Lovett Coffin or Dr. Kendrick Fries) Please contact office for sooner follow up if symptoms do not improve or worsen or seek emergency care .

## 2016-11-12 NOTE — Patient Instructions (Signed)
It is nice to see you today. We will repeat your PFT now that your cough is gone. Return in 1 week or after PFT's are completed to determine if we need to try an inhaler. ( With Sarah or Dr. Kendrick Fries) Please contact office for sooner follow up if symptoms do not improve or worsen or seek emergency care .

## 2016-11-12 NOTE — Progress Notes (Addendum)
History of Present Illness Tammy Osborne is a 61 y.o. female lifelong nonsmoker  with new finding of diffuse parenchymal lung disease on CT scanning of the chest. She is followed by Dr. Kendrick Fries. Synopsis: This is a pleasant 68-year-old female who is a lifelong nonsmoker who comes to my clinic today to establish care for a new finding of diffuse parenchymal lung disease on CT scanning of the chest. There is groundglass in a bronchovascular distribution with air trapping.She says it for over a year she's had a cough and this is been associated with some mild shortness of breath when climbing hills. When she walks on level ground she doesn't have any dyspnea. However, she's had this sensation of some chest pressure and trouble breathing for the last year. Overall the symptoms are mild and she continues to work on a day-to-day basis in a job that requires a fair amount of exertion. 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 She says that this is been associated with some pain between her shoulder blades in her neck. Her primary complaint is the cough which is rarely productive of yellow mucus, mostly in the mornings, and the back pain. She's never smoked cigarettes. She's only worked in this job for the last 8 years. She moved out of her apartment about a year ago after living there for 7 years. She now lives in a house as a crawl space but no mold or mildew. There is no animals in the home. 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 She does not do this type of work herself.  Objectively she also has wheezing on exam. I suppose this could be as simple as some sort of asthma but given the groundglass with air trapping that she sees it raises suspicion for a bronchiolitis type syndrome or hypersensitivity pneumonitis.She has cough that could be related to her lung disease or to ACE inhibitor.  11/12/2016 2 Week Follow Up Visit. Pt. Presents for follow  up.She is speaking through an interpreter as there is a language barrier. Interpreter is her granddaughter.She states she could not complete the PFT due to her cough being so bad.She completed her prednisone taper.  She states her cough is better, and that she has been over all much improved on the prednisone.Her cough is much better. She states her shortness of breath is much better. She states her body aches are gone. She denies any fever,chest pain, orthopnea or hemoptysis.   Test Results: Chest imaging:  August 2018 CT chest images independently reviewed showing diffuse groundglass bilaterally and a bronco vascular distribution with clear air trapping. No pulmonary parenchymal fibrosis seen.  PFT's attempted 10/17/2016: 10/15/2016: Sed Rate >> 59   CBC Latest Ref Rng & Units 10/15/2016 09/06/2016 05/11/2016  WBC 4.0 - 10.5 K/uL 12.9(H) 9.6 14.9(H)  Hemoglobin 12.0 - 15.0 g/dL 29.5 28.4 11.7(L)  Hematocrit 36.0 - 46.0 % 41.5 39.6 36.3  Platelets 150.0 - 400.0 K/uL 382.0 - 544(H)    BMP Latest Ref Rng & Units 09/06/2016 04/30/2016 04/30/2016  Glucose 65 - 99 mg/dL 90 132(G) 401(U)  BUN 8 - 27 mg/dL 22 27(O) 53(G)  Creatinine 0.57 - 1.00 mg/dL 6.44 0.34 7.42  BUN/Creat Ratio 12 - 28 26 - -  Sodium 134 - 144 mmol/L 143 143 140  Potassium 3.5 - 5.2 mmol/L 4.6 3.8 3.7  Chloride 96 - 106 mmol/L 105 110 112(H)  CO2 20 - 29 mmol/L 22 -  24  Calcium 8.7 - 10.3 mg/dL 16.1 - 8.1(L)    BNP    Component Value Date/Time   BNP 101.6 (H) 04/06/2016 1732     Past medical hx Past Medical History:  Diagnosis Date  . Hypertension      Social History  Substance Use Topics  . Smoking status: Passive Smoke Exposure - Never Smoker  . Smokeless tobacco: Never Used     Comment: spouse smoked in home X6-7 years.   . Alcohol use No    Ms.Benitez reports that she is a non-smoker but has been exposed to tobacco smoke. She has never used smokeless tobacco. She reports that she does not drink  alcohol.  Tobacco Cessation: Never smoker  Past surgical hx, Family hx, Social hx all reviewed.  Current Outpatient Prescriptions on File Prior to Visit  Medication Sig  . amLODipine (NORVASC) 10 MG tablet Take 1 tablet (10 mg total) by mouth daily.  Marland Kitchen atorvastatin (LIPITOR) 40 MG tablet Take 40 mg by mouth daily.  . ferrous sulfate (FERROUSUL) 325 (65 FE) MG tablet Take 1 tablet (325 mg total) by mouth daily with breakfast.  . gabapentin (NEURONTIN) 100 MG capsule Take 2 capsules (200 mg total) by mouth at bedtime.  Marland Kitchen lisinopril-hydrochlorothiazide (PRINZIDE,ZESTORETIC) 20-12.5 MG tablet Take 1 tablet by mouth 2 (two) times daily.   . metFORMIN (GLUCOPHAGE) 500 MG tablet Take 1 tablet (500 mg total) by mouth daily with breakfast.   No current facility-administered medications on file prior to visit.      No Known Allergies  Review Of Systems:  Constitutional:   No  weight loss, night sweats,  Fevers, chills, fatigue, or  lassitude.  HEENT:   No headaches,  Difficulty swallowing,  Tooth/dental problems, or  Sore throat,                No sneezing, itching, ear ache, nasal congestion, post nasal drip,   CV:  No chest pain,  Orthopnea, PND, swelling in lower extremities, anasarca, dizziness, palpitations, syncope.   GI  No heartburn, indigestion, abdominal pain, nausea, vomiting, diarrhea, change in bowel habits, loss of appetite, bloody stools.   Resp: No shortness of breath with exertion or at rest.  No excess mucus, no productive cough,  No non-productive cough,  No coughing up of blood.  No change in color of mucus.  No wheezing.  No chest wall deformity  Skin: no rash or lesions.  GU: no dysuria, change in color of urine, no urgency or frequency.  No flank pain, no hematuria   MS:  No joint pain or swelling.  No decreased range of motion.  No back pain.  Psych:  No change in mood or affect. No depression or anxiety.  No memory loss.   Vital Signs BP (!) 152/78 (BP  Location: Left Arm, Cuff Size: Normal)   Pulse 85   Ht  (1.499 m)   Wt 124 lb 9.6 oz (56.5 kg)   LMP  (LMP Unknown)   SpO2 96%   BMI 25.17 kg/m    Physical Exam:  General- No distress,  A&Ox3, pleasant Spanish speaking female  ENT: No sinus tenderness, TM clear, pale nasal mucosa, no oral exudate,no post nasal drip, no LAN Cardiac: S1, S2, regular rate and rhythm, no murmur Chest: No wheeze/ rales/ dullness; no accessory muscle use, no nasal flaring, no sternal retractions Abd.: Soft Non-tender Ext: No clubbing cyanosis, edema Neuro:  normal strength Skin: No rashes, warm and dry Psych: normal  mood and behavior   Assessment/Plan  Interstitial pulmonary disease (HCC) Improvement after treatment with prednisone Unable to complete PFT while acutely ill Plan: We will repeat your PFT now that your cough is gone. Return in 1 week or after PFT's are completed to determine if we need to try an inhaler. ( With Josseline Reddin or Dr. Kendrick Fries) Please contact office for sooner follow up if symptoms do not improve or worsen or seek emergency care .     Bevelyn Ngo, NP 11/12/2016  1:55 PM

## 2016-11-12 NOTE — Progress Notes (Signed)
Reviewed, agree 

## 2016-11-13 ENCOUNTER — Encounter: Payer: Self-pay | Admitting: Internal Medicine

## 2016-11-13 ENCOUNTER — Ambulatory Visit: Payer: Self-pay

## 2016-11-21 ENCOUNTER — Other Ambulatory Visit: Payer: Self-pay | Admitting: Pulmonary Disease

## 2016-11-21 DIAGNOSIS — R0600 Dyspnea, unspecified: Secondary | ICD-10-CM

## 2016-11-22 ENCOUNTER — Ambulatory Visit (INDEPENDENT_AMBULATORY_CARE_PROVIDER_SITE_OTHER): Payer: Self-pay | Admitting: Pulmonary Disease

## 2016-11-22 ENCOUNTER — Encounter: Payer: Self-pay | Admitting: Pulmonary Disease

## 2016-11-22 VITALS — BP 118/84 | HR 105 | Ht 59.0 in | Wt 126.0 lb

## 2016-11-22 DIAGNOSIS — J849 Interstitial pulmonary disease, unspecified: Secondary | ICD-10-CM

## 2016-11-22 DIAGNOSIS — R0600 Dyspnea, unspecified: Secondary | ICD-10-CM

## 2016-11-22 DIAGNOSIS — R0602 Shortness of breath: Secondary | ICD-10-CM

## 2016-11-22 LAB — PULMONARY FUNCTION TEST
DL/VA % PRED: 98 %
DL/VA: 4.01 ml/min/mmHg/L
DLCO COR: 11.75 ml/min/mmHg
DLCO cor % pred: 67 %
DLCO unc % pred: 67 %
DLCO unc: 11.9 ml/min/mmHg
FEF 25-75 Post: 3.16 L/sec
FEF 25-75 Pre: 3.14 L/sec
FEF2575-%CHANGE-POST: 0 %
FEF2575-%PRED-PRE: 152 %
FEF2575-%Pred-Post: 153 %
FEV1-%CHANGE-POST: 0 %
FEV1-%PRED-PRE: 76 %
FEV1-%Pred-Post: 76 %
FEV1-Post: 1.6 L
FEV1-Pre: 1.59 L
FEV1FVC-%CHANGE-POST: 0 %
FEV1FVC-%Pred-Pre: 117 %
FEV6-%Change-Post: 1 %
FEV6-%PRED-PRE: 66 %
FEV6-%Pred-Post: 67 %
FEV6-PRE: 1.73 L
FEV6-Post: 1.76 L
FEV6FVC-%PRED-PRE: 104 %
FEV6FVC-%Pred-Post: 104 %
FVC-%Change-Post: 1 %
FVC-%PRED-POST: 64 %
FVC-%PRED-PRE: 63 %
FVC-POST: 1.76 L
FVC-PRE: 1.73 L
POST FEV1/FVC RATIO: 91 %
POST FEV6/FVC RATIO: 100 %
PRE FEV6/FVC RATIO: 100 %
Pre FEV1/FVC ratio: 92 %
RV % PRED: 55 %
RV: 0.97 L
TLC % PRED: 65 %
TLC: 2.81 L

## 2016-11-22 NOTE — Progress Notes (Signed)
Subjective:    Patient ID: Tammy Osborne, female    DOB: 1955-12-01, 61 y.o.   MRN: [id number]  Synopsis: Referred for ILD in 2018.  She is a lifelong non-smoker.  Lung function testing showed some restriction, CT scanning of the chest showed nonspecific groundglass changes in an upper lobe predominant fashion.  Blood work did not show evidence of an underlying connective tissue disease.  HPI Chief Complaint  Patient presents with  . Follow-up    review PFT.  pt c/o increased sob with exertion, worse with incline.    She says that when she took the prednisone she felt significantly better.  She had no trouble breathing, her back pain went away.  However, since then she has had some increase in cough.  No mucus production.  Occasional wheeze.  She also notes some leg swelling of uncertain etiology.  No chest pain.  She continues to have the back pain now that she is off the prednisone.  Past Medical History:  Diagnosis Date  . Hypertension        Review of Systems  Constitutional: Negative for fever and unexpected weight change.  HENT: Positive for congestion. Negative for dental problem, ear pain, nosebleeds, postnasal drip, rhinorrhea, sinus pressure, sneezing, sore throat and trouble swallowing.   Respiratory: Positive for cough, chest tightness, shortness of breath and wheezing.   Cardiovascular: Negative for palpitations and leg swelling.       Objective:   Physical Exam  Vitals:   11/22/16 1211  BP: 118/84  Pulse: (!) 105  SpO2: 97%  Weight: 126 lb (57.2 kg)  Height: 4\' 11"  (1.499 m)    Gen: well appearing HENT: OP clear, TM's clear, neck supple PULM: rare wheeze B, normal percussion CV: RRR, no mgr, trace edema GI: BS+, soft, nontender Derm: no cyanosis or rash Psyche: normal mood and affect   CBC    Component Value Date/Time   WBC 12.9 (H) 10/15/2016 1553   RBC 5.49 (H) 10/15/2016 1553   HGB 13.1 10/15/2016 1553   HGB 12.8 09/06/2016 1036   HCT 41.5  10/15/2016 1553   HCT 39.6 09/06/2016 1036   PLT 382.0 10/15/2016 1553   MCV 75.6 (L) 10/15/2016 1553   MCV 73 (L) 09/06/2016 1036   MCH 23.5 (L) 09/06/2016 1036   MCH 26.6 05/11/2016 1051   MCHC 31.5 10/15/2016 1553   RDW 18.0 (H) 10/15/2016 1553   RDW 16.4 (H) 09/06/2016 1036   LYMPHSABS 3.9 10/15/2016 1553   LYMPHSABS 3.0 09/06/2016 1036   MONOABS 0.9 10/15/2016 1553   EOSABS 0.0 10/15/2016 1553   EOSABS 0.0 09/06/2016 1036   BASOSABS 0.1 10/15/2016 1553   BASOSABS 0.0 09/06/2016 1036    BMET    Component Value Date/Time   NA 143 09/06/2016 1036   K 4.6 09/06/2016 1036   CL 105 09/06/2016 1036   CO2 22 09/06/2016 1036   GLUCOSE 90 09/06/2016 1036   GLUCOSE 109 (H) 04/30/2016 0855   BUN 22 09/06/2016 1036   CREATININE 0.84 09/06/2016 1036   CALCIUM 10.0 09/06/2016 1036   GFRNONAA 76 09/06/2016 1036   GFRAA 87 09/06/2016 1036   Chest imaging: August 2018 CT chest images independently reviewed showing diffuse groundglass bilaterally and a bronco vascular distribution with clear air trapping. No pulmonary parenchymal fibrosis seen.  Pulmonary function test: October 2018 ratio normal, FVC 1.76 L 64% predicted, total lung capacity 2.81 L 65% predicted, DLCO 11.90 67% predicted  Lab: September 2018 eosinophil count 0,  anti-Jo 1-, centromere antibody negative, SSA/SSB negative, SCL 70-, rheumatoid factor negative, hypersensitivity pneumonitis negative.     Assessment & Plan:  Shortness of breath - Plan: ECHOCARDIOGRAM COMPLETE  Interstitial pulmonary disease (HCC)  Discussion: Ms. Tammy Osborne has diffuse parenchymal lung disease of uncertain etiology.  It seems as if she has a steroid responsive process because she had a dramatic response to prednisone.  However, considering the fact that we still do not know the underlying cause I am reluctant to give her long course of prednisone until we have a diagnosis.  Today on physical exam she does have elevated JVD and some ankle  swelling.  I suppose that the mosaicism seen on the CT scan of her chest could represent underlying pulmonary hypertension with a process like pulmonary capillary hemangiomatosis.  I think the best approach moving forward is to evaluate for pulmonary hypertension first, if the echo is normal then we need to perform a bronchoscopy with BAL and biopsy.  Specifically we will be looking for evidence of eosinophilic pneumonia or hypersensitivity pneumonitis.  If this test is negative then she will need to have an open lung biopsy.  Plan: For shortness of breath with an abnormal CT scan of the chest: I am going to order an echocardiogram to evaluate for pulmonary hypertension If that test is normal then we will arrange for a bronchoscopy with biopsy We will call you when we have the results of the echocardiogram  We will see you back in about 4 weeks or sooner if needed  > 50% of this 29 minute visit spent face to face   Current Outpatient Prescriptions:  .  amLODipine (NORVASC) 10 MG tablet, Take 1 tablet (10 mg total) by mouth daily., Disp: 30 tablet, Rfl: 6 .  atorvastatin (LIPITOR) 40 MG tablet, Take 40 mg by mouth daily., Disp: , Rfl:  .  ferrous sulfate (FERROUSUL) 325 (65 FE) MG tablet, Take 1 tablet (325 mg total) by mouth daily with breakfast., Disp: 60 tablet, Rfl: 0 .  gabapentin (NEURONTIN) 100 MG capsule, Take 2 capsules (200 mg total) by mouth at bedtime., Disp: 60 capsule, Rfl: 3 .  lisinopril-hydrochlorothiazide (PRINZIDE,ZESTORETIC) 20-12.5 MG tablet, Take 1 tablet by mouth 2 (two) times daily. , Disp: , Rfl:  .  metFORMIN (GLUCOPHAGE) 500 MG tablet, Take 1 tablet (500 mg total) by mouth daily with breakfast., Disp: 90 tablet, Rfl: 3

## 2016-11-22 NOTE — Patient Instructions (Signed)
For shortness of breath with an abnormal CT scan of the chest: I am going to order an echocardiogram to evaluate for pulmonary hypertension If that test is normal then we will arrange for a bronchoscopy with biopsy We will call you when we have the results of the echocardiogram  We will see you back in about 4 weeks or sooner if needed

## 2016-11-22 NOTE — Patient Instructions (Signed)
PFT done today. 

## 2016-11-29 ENCOUNTER — Ambulatory Visit (HOSPITAL_COMMUNITY): Payer: Self-pay | Attending: Cardiovascular Disease

## 2016-11-29 ENCOUNTER — Other Ambulatory Visit: Payer: Self-pay

## 2016-11-29 DIAGNOSIS — R0602 Shortness of breath: Secondary | ICD-10-CM | POA: Insufficient documentation

## 2016-11-29 NOTE — Progress Notes (Signed)
Spoke with pt and notified of results per Dr.McQuaid. Pt verbalized understanding and denied any questions. 

## 2016-12-19 ENCOUNTER — Encounter: Payer: Self-pay | Admitting: Internal Medicine

## 2016-12-24 ENCOUNTER — Ambulatory Visit: Payer: No Typology Code available for payment source | Attending: Internal Medicine | Admitting: Internal Medicine

## 2016-12-24 ENCOUNTER — Encounter: Payer: Self-pay | Admitting: Internal Medicine

## 2016-12-24 VITALS — BP 156/83 | HR 84 | Temp 98.0°F | Resp 18 | Ht 59.0 in | Wt 123.0 lb

## 2016-12-24 DIAGNOSIS — J849 Interstitial pulmonary disease, unspecified: Secondary | ICD-10-CM | POA: Insufficient documentation

## 2016-12-24 DIAGNOSIS — Z1231 Encounter for screening mammogram for malignant neoplasm of breast: Secondary | ICD-10-CM

## 2016-12-24 DIAGNOSIS — E119 Type 2 diabetes mellitus without complications: Secondary | ICD-10-CM | POA: Insufficient documentation

## 2016-12-24 DIAGNOSIS — Z90710 Acquired absence of both cervix and uterus: Secondary | ICD-10-CM | POA: Insufficient documentation

## 2016-12-24 DIAGNOSIS — R0981 Nasal congestion: Secondary | ICD-10-CM | POA: Insufficient documentation

## 2016-12-24 DIAGNOSIS — I10 Essential (primary) hypertension: Secondary | ICD-10-CM | POA: Insufficient documentation

## 2016-12-24 DIAGNOSIS — E785 Hyperlipidemia, unspecified: Secondary | ICD-10-CM | POA: Insufficient documentation

## 2016-12-24 DIAGNOSIS — Z1239 Encounter for other screening for malignant neoplasm of breast: Secondary | ICD-10-CM

## 2016-12-24 DIAGNOSIS — Z1211 Encounter for screening for malignant neoplasm of colon: Secondary | ICD-10-CM

## 2016-12-24 DIAGNOSIS — Z7984 Long term (current) use of oral hypoglycemic drugs: Secondary | ICD-10-CM | POA: Insufficient documentation

## 2016-12-24 DIAGNOSIS — Z79899 Other long term (current) drug therapy: Secondary | ICD-10-CM | POA: Insufficient documentation

## 2016-12-24 DIAGNOSIS — Z23 Encounter for immunization: Secondary | ICD-10-CM

## 2016-12-24 DIAGNOSIS — D649 Anemia, unspecified: Secondary | ICD-10-CM | POA: Insufficient documentation

## 2016-12-24 LAB — GLUCOSE, POCT (MANUAL RESULT ENTRY): POC GLUCOSE: 126 mg/dL — AB (ref 70–99)

## 2016-12-24 MED ORDER — LISINOPRIL 5 MG PO TABS: 5.0000 mg | ORAL_TABLET | Freq: Every day | ORAL | 3 refills | Status: DC

## 2016-12-24 MED ORDER — FLUTICASONE PROPIONATE 50 MCG/ACT NA SUSP: 2.0000 | Freq: Every day | NASAL | 0 refills | Status: DC

## 2016-12-24 NOTE — Patient Instructions (Signed)
Give appointment with Kennyth ArnoldStacy for 1 month for BP recheck.   Start Lisinopril 5 mg daily.

## 2016-12-24 NOTE — Progress Notes (Addendum)
Patient ID: Tammy Osborne, female    DOB: 03/19/1955  MRN: [id number]  CC: Diabetes   Subjective: Tammy Osborne is a 61 y.o. female who presents for chronic ds management. Her concerns today include:  Patient with history of hypertension, newly diagnosed diabetes, ILD, HL, anemia  1. ILD: -c/o having a cold recently. + nasal congestion and dryness in throat. "Very mild cough and a whistle like I have a lot of phlegm stuck in my chest but no phlegm is coming out." -no fever. Drinking tea. -in regards to the ILD, she had response to steroid but pulmonary plans to do BAL with bx to determine dx. if negative next step would be open lung biopsy.  2. DM: started on Metformin on last visit. Tolerating OK. -no device to check BS. She does not want one.  -needs eye exam -c/o inflammation in feet. Stands a lot at work. "They do not hurt, just inflammation." No numbness.+ tingling in feet. Ran out of Gabapentin which helps. RF today  3. HTN: compliant with Norvasc but does not have Lis/HCTZ. Did not have RFs on Lis/HCTZ and thought she was not to take any more.  She has been off of it for over 2 months -limits salt in foods -no CP/LE edema  Patient Active Problem List   Diagnosis Date Noted  . New onset type 2 diabetes mellitus (HCC) 10/19/2016  . Essential hypertension 09/06/2016  . Interstitial pulmonary disease (HCC) 09/06/2016  . Cough in adult 09/06/2016  . Anemia 09/06/2016  . Hyperlipidemia 09/06/2016  . Numbness of lower limb 09/06/2016     Current Outpatient Medications on File Prior to Visit  Medication Sig Dispense Refill  . amLODipine (NORVASC) 10 MG tablet Take 1 tablet (10 mg total) by mouth daily. 30 tablet 6  . atorvastatin (LIPITOR) 40 MG tablet Take 40 mg by mouth daily.    . ferrous sulfate (FERROUSUL) 325 (65 FE) MG tablet Take 1 tablet (325 mg total) by mouth daily with breakfast. 60 tablet 0  . gabapentin (NEURONTIN) 100 MG capsule Take 2 capsules (200 mg total)  by mouth at bedtime. 60 capsule 3  . lisinopril-hydrochlorothiazide (PRINZIDE,ZESTORETIC) 20-12.5 MG tablet Take 1 tablet by mouth 2 (two) times daily.     . metFORMIN (GLUCOPHAGE) 500 MG tablet Take 1 tablet (500 mg total) by mouth daily with breakfast. 90 tablet 3   No current facility-administered medications on file prior to visit.     No Known Allergies  Social History   Socioeconomic History  . Marital status: Married    Spouse name: Not on file  . Number of children: Not on file  . Years of education: Not on file  . Highest education level: Not on file  Social Needs  . Financial resource strain: Not on file  . Food insecurity - worry: Not on file  . Food insecurity - inability: Not on file  . Transportation needs - medical: Not on file  . Transportation needs - non-medical: Not on file  Occupational History  . Not on file  Tobacco Use  . Smoking status: Passive Smoke Exposure - Never Smoker  . Smokeless tobacco: Never Used  . Tobacco comment: spouse smoked in home X6-7 years.   Substance and Sexual Activity  . Alcohol use: No  . Drug use: Not on file  . Sexual activity: Not on file  Other Topics Concern  . Not on file  Social History Narrative  . Not on file  History reviewed. No pertinent family history.  Past Surgical History:  Procedure Laterality Date  . ABDOMINAL HYSTERECTOMY      ROS: Review of Systems Negative except as stated above   PHYSICAL EXAM: BP (!) 156/83 (BP Location: Left Arm, Patient Position: Sitting, Cuff Size: Normal)   Pulse 84   Temp 98 F (36.7 C) (Oral)   Resp 18   Ht 4\' 11"  (1.499 m)   Wt 123 lb (55.8 kg)   LMP  (LMP Unknown)   SpO2 94%   BMI 24.84 kg/m   Wt Readings from Last 3 Encounters:  12/24/16 123 lb (55.8 kg)  11/22/16 126 lb (57.2 kg)  11/12/16 124 lb 9.6 oz (56.5 kg)  180/86  Physical Exam General appearance - alert, well appearing, and in no distress Mental status - alert, oriented to person, place,  and time, normal mood, behavior, speech, dress, motor activity, and thought processes Nose -clear mucus.  Mild enlargement of the nasal turbinates. Mouth - mucous membranes moist, pharynx normal without lesions Neck - supple, no significant adenopathy Chest -coarse crackles about a third of the way up. Heart - normal rate, regular rhythm, normal S1, S2, no murmurs, rubs, clicks or gallops Extremities - peripheral pulses normal, no pedal edema, no clubbing or cyanosis   Lab Results  Component Value Date   HGBA1C 6.6 (H) 09/06/2016   Results for orders placed or performed in visit on 12/24/16  Glucose (CBG)  Result Value Ref Range   POC Glucose 126 (A) 70 - 99 mg/dl    ASSESSMENT AND PLAN: 1. New onset type 2 diabetes mellitus (HCC) -Continue Metformin. Advise to eat meals on time to prevent hypoglycemia. Went of symptoms of same - Glucose (CBG)  2. Essential hypertension Not at goal.  Continue Norvasc.  Add lisinopril 5 mg daily. DASH diet encouraged -See clinical pharmacist in 1 month for recheck on blood pressure. - lisinopril (PRINIVIL,ZESTRIL) 5 MG tablet; Take 1 tablet (5 mg total) by mouth daily.  Dispense: 90 tablet; Refill: 3  3. Nasal congestion - fluticasone (FLONASE) 50 MCG/ACT nasal spray; Place 2 sprays into both nostrils daily.  Dispense: 16 g; Refill: 0  4. Need for Tdap vaccination - Tdap vaccine greater than or equal to 7yo IM  5. Breast cancer screening - MM Digital Screening; Future  6. Colon cancer screening - Ambulatory referral to Gastroenterology   Patient was given the opportunity to ask questions.  Patient verbalized understanding of the plan and was able to repeat key elements of the plan.  [employer] interpreter used during this encounter. #[id number]  Orders Placed This Encounter  Procedures  . Glucose (CBG)     Requested Prescriptions    No prescriptions requested or ordered in this encounter    F/u in 3  mths Jonah Blueeborah Johnson, MD, Jerrel IvoryFACP

## 2016-12-26 ENCOUNTER — Ambulatory Visit (INDEPENDENT_AMBULATORY_CARE_PROVIDER_SITE_OTHER): Payer: No Typology Code available for payment source | Admitting: Pulmonary Disease

## 2016-12-26 ENCOUNTER — Encounter: Payer: Self-pay | Admitting: Pulmonary Disease

## 2016-12-26 VITALS — BP 134/68 | HR 89 | Ht 59.0 in | Wt 122.0 lb

## 2016-12-26 DIAGNOSIS — R05 Cough: Secondary | ICD-10-CM

## 2016-12-26 DIAGNOSIS — R059 Cough, unspecified: Secondary | ICD-10-CM

## 2016-12-26 DIAGNOSIS — J301 Allergic rhinitis due to pollen: Secondary | ICD-10-CM

## 2016-12-26 DIAGNOSIS — J849 Interstitial pulmonary disease, unspecified: Secondary | ICD-10-CM

## 2016-12-26 NOTE — Patient Instructions (Signed)
Abnormal CT scan of the chest, possible interstitial lung disease: As we discussed today we will repeat the breathing test in April Please come back and see me sooner if you have any difficulty with shortness of breath   Cough: I believe this is due to allergic rhinitis: Use Neil Med rinses with distilled water at least twice per day using the instructions on the package. 1/2 hour after using the Lloyd HugerNeil Med rinse, use Nasacort (or Flonase) two puffs in each nostril once per day.  Remember that the Nasacort can take 1-2 weeks to work after regular use. Use generic zyrtec (cetirizine) every day.  If this doesn't help, then stop taking it and use chlorpheniramine-phenylephrine combination tablets.  Follow up in April 2019

## 2016-12-26 NOTE — Progress Notes (Signed)
Subjective:    Patient ID: Tammy LanceElvia Osborne, female    DOB: 08/25/1955, 61 y.o.   MRN: [id number]  Synopsis: Referred for ILD in 2018.  She is a lifelong non-smoker.  Lung function testing showed some restriction, CT scanning of the chest showed nonspecific groundglass changes in an upper lobe predominant fashion.  Blood work did not show evidence of an underlying connective tissue disease.  HPI Chief Complaint  Patient presents with  . Follow-up    review echo.  pt states her SOB has improved, states her breathing is at baseline today.     But she tells me that she is feeling much better.  She says that her shortness of breath has resolved and she no longer feels any difficulty breathing.  Her energy level is back to baseline.  She is active at work and has no difficulty.  She does have a cough in the mornings which is productive of phlegm.  She thinks this is related to postnasal drip.  She has been suffering from a lot of nasal congestion at work.  She just started taking Flonase 2 days ago.  She still has some mild right flank pain for which she takes ibuprofen.  Past Medical History:  Diagnosis Date  . Hypertension        Review of Systems  Constitutional: Negative for fever and unexpected weight change.  HENT: Positive for congestion. Negative for dental problem, ear pain, nosebleeds, postnasal drip, rhinorrhea, sinus pressure, sneezing, sore throat and trouble swallowing.   Respiratory: Positive for cough. Negative for chest tightness, shortness of breath and wheezing.   Cardiovascular: Negative for palpitations and leg swelling.  Neurological: Facial asymmetry: .dbmfu.       Objective:   Physical Exam  Vitals:   12/26/16 0945  BP: 134/68  Pulse: 89  SpO2: 98%  Weight: 122 lb (55.3 kg)  Height: 4\' 11"  (1.499 m)    Gen: well appearing HENT: OP clear, TM's clear, neck supple PULM: Some wheezing upper lobes, normal effort, normal percussion CV: RRR, no mgr, trace  edema GI: BS+, soft, nontender Derm: no cyanosis or rash Psyche: normal mood and affect    CBC    Component Value Date/Time   WBC 12.9 (H) 10/15/2016 1553   RBC 5.49 (H) 10/15/2016 1553   HGB 13.1 10/15/2016 1553   HGB 12.8 09/06/2016 1036   HCT 41.5 10/15/2016 1553   HCT 39.6 09/06/2016 1036   PLT 382.0 10/15/2016 1553   MCV 75.6 (L) 10/15/2016 1553   MCV 73 (L) 09/06/2016 1036   MCH 23.5 (L) 09/06/2016 1036   MCH 26.6 05/11/2016 1051   MCHC 31.5 10/15/2016 1553   RDW 18.0 (H) 10/15/2016 1553   RDW 16.4 (H) 09/06/2016 1036   LYMPHSABS 3.9 10/15/2016 1553   LYMPHSABS 3.0 09/06/2016 1036   MONOABS 0.9 10/15/2016 1553   EOSABS 0.0 10/15/2016 1553   EOSABS 0.0 09/06/2016 1036   BASOSABS 0.1 10/15/2016 1553   BASOSABS 0.0 09/06/2016 1036    BMET    Component Value Date/Time   NA 143 09/06/2016 1036   K 4.6 09/06/2016 1036   CL 105 09/06/2016 1036   CO2 22 09/06/2016 1036   GLUCOSE 90 09/06/2016 1036   GLUCOSE 109 (H) 04/30/2016 0855   BUN 22 09/06/2016 1036   CREATININE 0.84 09/06/2016 1036   CALCIUM 10.0 09/06/2016 1036   GFRNONAA 76 09/06/2016 1036   GFRAA 87 09/06/2016 1036   Chest imaging: August 2018 CT chest images independently  reviewed showing diffuse groundglass bilaterally and a bronco vascular distribution with clear air trapping. No pulmonary parenchymal fibrosis seen.  Pulmonary function test: October 2018 ratio normal, FVC 1.76 L 64% predicted, total lung capacity 2.81 L 65% predicted, DLCO 11.90 67% predicted  Echo: 11/2016 LVEF normal, RV normal, no evidence of pulmonary hypertension  Lab: September 2018 eosinophil count 0, anti-Jo 1-, centromere antibody negative, SSA/SSB negative, SCL 70-, rheumatoid factor negative, hypersensitivity pneumonitis negative.     Assessment & Plan:  Interstitial pulmonary disease (HCC) - Plan: Pulmonary function test  Cough  Allergic rhinitis due to pollen, unspecified seasonality  Discussion: I am  pleased that she is feeling better.  Since the last visit she says she has had complete resolution of symptoms.  Her echocardiogram showed no evidence of pulmonary hypertension.  However, she continues to have a upper lobe predominant wheezing.  I wonder if she does not have some evidence of bronchiolitis related to a viral infection.  I am pleased that this is getting better, so because of that I do not think we need to pursue a bronchoscopy or evaluation at this point.  However, if her symptoms worsen she needs to come back and see me sooner.  We will also plan on repeating the pulmonary function test 6 months after the last (we will plan for April 2019), if there is evidence of worsening then we will need to re-a tissue diagnosis.  Allergic rhinitis is been a new and worsening problem for her lately.  Plan:  Abnormal CT scan of the chest, possible interstitial lung disease: As we discussed today we will repeat the breathing test in April Please come back and see me sooner if you have any difficulty with shortness of breath   Cough: I believe this is due to allergic rhinitis: Use Neil Med rinses with distilled water at least twice per day using the instructions on the package. 1/2 hour after using the Lloyd HugerNeil Med rinse, use Nasacort (or Flonase) two puffs in each nostril once per day.  Remember that the Nasacort can take 1-2 weeks to work after regular use. Use generic zyrtec (cetirizine) every day.  If this doesn't help, then stop taking it and use chlorpheniramine-phenylephrine combination tablets.  Follow up in April 2019    Current Outpatient Medications:  .  amLODipine (NORVASC) 10 MG tablet, Take 1 tablet (10 mg total) by mouth daily., Disp: 30 tablet, Rfl: 6 .  atorvastatin (LIPITOR) 40 MG tablet, Take 40 mg by mouth daily., Disp: , Rfl:  .  ferrous sulfate (FERROUSUL) 325 (65 FE) MG tablet, Take 1 tablet (325 mg total) by mouth daily with breakfast., Disp: 60 tablet, Rfl: 0 .   fluticasone (FLONASE) 50 MCG/ACT nasal spray, Place 2 sprays into both nostrils daily., Disp: 16 g, Rfl: 0 .  gabapentin (NEURONTIN) 100 MG capsule, Take 2 capsules (200 mg total) by mouth at bedtime., Disp: 60 capsule, Rfl: 3 .  lisinopril (PRINIVIL,ZESTRIL) 5 MG tablet, Take 1 tablet (5 mg total) by mouth daily., Disp: 90 tablet, Rfl: 3 .  metFORMIN (GLUCOPHAGE) 500 MG tablet, Take 1 tablet (500 mg total) by mouth daily with breakfast., Disp: 90 tablet, Rfl: 3

## 2017-03-26 ENCOUNTER — Ambulatory Visit: Payer: Self-pay | Admitting: Internal Medicine

## 2017-04-25 ENCOUNTER — Ambulatory Visit (INDEPENDENT_AMBULATORY_CARE_PROVIDER_SITE_OTHER): Payer: No Typology Code available for payment source | Admitting: Pulmonary Disease

## 2017-04-25 ENCOUNTER — Encounter: Payer: Self-pay | Admitting: Pulmonary Disease

## 2017-04-25 VITALS — BP 136/72 | HR 89 | Ht 59.0 in | Wt 122.4 lb

## 2017-04-25 DIAGNOSIS — J849 Interstitial pulmonary disease, unspecified: Secondary | ICD-10-CM

## 2017-04-25 DIAGNOSIS — R062 Wheezing: Secondary | ICD-10-CM

## 2017-04-25 NOTE — Patient Instructions (Signed)
For wheezing in the upper lobes after a respiratory infection: We will repeat a lung function test and chest x-ray today We will call you if either of these tests are abnormal We will plan on seeing you back in 1 year or sooner if needed  For the nerve pain in the right leg: I will refill your gabapentin today, though you need to go back to the doctor who originally prescribed this medicine and have the problem assessed again.

## 2017-04-25 NOTE — Progress Notes (Signed)
Subjective:    Patient ID: Tammy LanceElvia Osborne, female    DOB: 10/03/1955, 62 y.o.   MRN: [id number]  Synopsis: Referred for ILD in 2018.  She is a lifelong non-smoker.  Lung function testing showed some restriction, CT scanning of the chest showed nonspecific groundglass changes in an upper lobe predominant fashion.  Blood work did not show evidence of an underlying connective tissue disease.  HPI Chief Complaint  Patient presents with  . Follow-up    pft from last OV was never scheduled.  pt denies any current breathing complaints.     She returns today for evaluation of an abnormal CT scan of the chest and some persistent wheezing heard in the upper lobes.  She says that in the last 6 months she has not had any problems breathing.  She reports no cough and she reports no problems with mucus production.  She still has some pain in the back from time to time which she attributes to work.  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  She requests that we refill some medicine which has been used for leg pain.  She has not had any bronchitis or pneumonia since the last visit.  She was supposed to have a lung function test but unfortunately this was not performed today.   Past Medical History:  Diagnosis Date  . Hypertension        Review of Systems  Constitutional: Negative for fever and unexpected weight change.  HENT: Positive for congestion. Negative for dental problem, ear pain, nosebleeds, postnasal drip, rhinorrhea, sinus pressure, sneezing, sore throat and trouble swallowing.   Respiratory: Positive for cough. Negative for chest tightness, shortness of breath and wheezing.   Cardiovascular: Negative for palpitations and leg swelling.  Neurological: Facial asymmetry: .dbmfu.       Objective:   Physical Exam  Vitals:   04/25/17 1111  BP: 136/72  Pulse: 89  SpO2: 100%  Weight: 122 lb 6.4 oz (55.5 kg)  Height: 4\' 11"  (1.499 m)    Gen:  well appearing HENT: OP clear, TM's clear, neck supple PULM: Upper lobe wheezing B, normal percussion CV: RRR, no mgr, trace edema GI: BS+, soft, nontender Derm: no cyanosis or rash Psyche: normal mood and affect     CBC    Component Value Date/Time   WBC 12.9 (H) 10/15/2016 1553   RBC 5.49 (H) 10/15/2016 1553   HGB 13.1 10/15/2016 1553   HGB 12.8 09/06/2016 1036   HCT 41.5 10/15/2016 1553   HCT 39.6 09/06/2016 1036   PLT 382.0 10/15/2016 1553   MCV 75.6 (L) 10/15/2016 1553   MCV 73 (L) 09/06/2016 1036   MCH 23.5 (L) 09/06/2016 1036   MCH 26.6 05/11/2016 1051   MCHC 31.5 10/15/2016 1553   RDW 18.0 (H) 10/15/2016 1553   RDW 16.4 (H) 09/06/2016 1036   LYMPHSABS 3.9 10/15/2016 1553   LYMPHSABS 3.0 09/06/2016 1036   MONOABS 0.9 10/15/2016 1553   EOSABS 0.0 10/15/2016 1553   EOSABS 0.0 09/06/2016 1036   BASOSABS 0.1 10/15/2016 1553   BASOSABS 0.0 09/06/2016 1036    BMET    Component Value Date/Time   NA 143 09/06/2016 1036   K 4.6 09/06/2016 1036   CL 105 09/06/2016 1036   CO2 22 09/06/2016 1036   GLUCOSE 90 09/06/2016 1036   GLUCOSE 109 (H) 04/30/2016 0855   BUN 22 09/06/2016 1036   CREATININE 0.84 09/06/2016  1036   CALCIUM 10.0 09/06/2016 1036   GFRNONAA 76 09/06/2016 1036   GFRAA 87 09/06/2016 1036   Chest imaging: August 2018 CT chest images independently reviewed showing diffuse groundglass bilaterally and a bronco vascular distribution with clear air trapping. No pulmonary parenchymal fibrosis seen.  Pulmonary function test: October 2018 ratio normal, FVC 1.76 L 64% predicted, total lung capacity 2.81 L 65% predicted, DLCO 11.90 67% predicted  Echo: 11/2016 LVEF normal, RV normal, no evidence of pulmonary hypertension  Lab: September 2018 eosinophil count 0, anti-Jo 1-, centromere antibody negative, SSA/SSB negative, SCL 70-, rheumatoid factor negative, hypersensitivity pneumonitis negative.     Assessment & Plan:  Interstitial pulmonary disease  (HCC)  Wheezing  Discussion: She continues to have wheezing in the upper lobes but she reports no pulmonary symptoms of any kind.  I suspect she had a severe infection which led to some damage in the lungs.  However, as stated previously the differential diagnosis does include interstitial lung disease.  I would like to repeat  a pulmonary function test and a chest x-ray today to make sure there is no evidence of progression.  If these show worsening findings then we will need to bring her back sooner.  Otherwise, I think she can be seen on an annual basis just to make sure that whatever this problem is (again, not clear but I think likely scarring related to a bad infection) does not progress.  Plan: For wheezing in the upper lobes after a respiratory infection: We will repeat a lung function test and chest x-ray today We will call you if either of these tests are abnormal We will plan on seeing you back in 1 year or sooner if needed  For the nerve pain in the right leg: I will refill your gabapentin today, though you need to go back to the doctor who originally prescribed this medicine and have the problem assessed again.   Current Outpatient Medications:  .  amLODipine (NORVASC) 10 MG tablet, Take 1 tablet (10 mg total) by mouth daily., Disp: 30 tablet, Rfl: 6 .  atorvastatin (LIPITOR) 40 MG tablet, Take 40 mg by mouth daily., Disp: , Rfl:  .  ferrous sulfate (FERROUSUL) 325 (65 FE) MG tablet, Take 1 tablet (325 mg total) by mouth daily with breakfast., Disp: 60 tablet, Rfl: 0 .  fluticasone (FLONASE) 50 MCG/ACT nasal spray, Place 2 sprays into both nostrils daily., Disp: 16 g, Rfl: 0 .  gabapentin (NEURONTIN) 100 MG capsule, Take 2 capsules (200 mg total) by mouth at bedtime., Disp: 60 capsule, Rfl: 3 .  lisinopril (PRINIVIL,ZESTRIL) 5 MG tablet, Take 1 tablet (5 mg total) by mouth daily., Disp: 90 tablet, Rfl: 3 .  metFORMIN (GLUCOPHAGE) 500 MG tablet, Take 1 tablet (500 mg total) by  mouth daily with breakfast., Disp: 90 tablet, Rfl: 3

## 2017-05-30 ENCOUNTER — Ambulatory Visit: Payer: Self-pay | Attending: Internal Medicine | Admitting: Internal Medicine

## 2017-05-30 ENCOUNTER — Encounter: Payer: Self-pay | Admitting: Internal Medicine

## 2017-05-30 VITALS — BP 120/62 | HR 78 | Temp 98.1°F | Resp 16 | Wt 121.8 lb

## 2017-05-30 DIAGNOSIS — J302 Other seasonal allergic rhinitis: Secondary | ICD-10-CM | POA: Insufficient documentation

## 2017-05-30 DIAGNOSIS — Z7984 Long term (current) use of oral hypoglycemic drugs: Secondary | ICD-10-CM | POA: Insufficient documentation

## 2017-05-30 DIAGNOSIS — M542 Cervicalgia: Secondary | ICD-10-CM | POA: Insufficient documentation

## 2017-05-30 DIAGNOSIS — E785 Hyperlipidemia, unspecified: Secondary | ICD-10-CM | POA: Insufficient documentation

## 2017-05-30 DIAGNOSIS — Z1211 Encounter for screening for malignant neoplasm of colon: Secondary | ICD-10-CM

## 2017-05-30 DIAGNOSIS — I10 Essential (primary) hypertension: Secondary | ICD-10-CM | POA: Insufficient documentation

## 2017-05-30 DIAGNOSIS — Z7722 Contact with and (suspected) exposure to environmental tobacco smoke (acute) (chronic): Secondary | ICD-10-CM | POA: Insufficient documentation

## 2017-05-30 DIAGNOSIS — E118 Type 2 diabetes mellitus with unspecified complications: Secondary | ICD-10-CM | POA: Insufficient documentation

## 2017-05-30 DIAGNOSIS — Z79899 Other long term (current) drug therapy: Secondary | ICD-10-CM | POA: Insufficient documentation

## 2017-05-30 LAB — GLUCOSE, POCT (MANUAL RESULT ENTRY): POC GLUCOSE: 95 mg/dL (ref 70–99)

## 2017-05-30 LAB — POCT GLYCOSYLATED HEMOGLOBIN (HGB A1C): HEMOGLOBIN A1C: 5.9

## 2017-05-30 MED ORDER — LORATADINE 10 MG PO TABS: 10.0000 mg | ORAL_TABLET | Freq: Every day | ORAL | 11 refills | Status: DC

## 2017-05-30 MED ORDER — DICLOFENAC SODIUM 1 % TD GEL: 2.0000 g | Freq: Four times a day (QID) | TRANSDERMAL | 1 refills | Status: DC

## 2017-05-30 MED ORDER — METHOCARBAMOL 500 MG PO TABS: 500.0000 mg | ORAL_TABLET | Freq: Two times a day (BID) | ORAL | 0 refills | Status: DC | PRN

## 2017-05-30 MED ORDER — ATORVASTATIN CALCIUM 40 MG PO TABS: 40.0000 mg | ORAL_TABLET | Freq: Every day | ORAL | 4 refills | Status: DC

## 2017-05-30 MED ORDER — AMLODIPINE BESYLATE 10 MG PO TABS: 10.0000 mg | ORAL_TABLET | Freq: Every day | ORAL | 6 refills | Status: DC

## 2017-05-30 NOTE — Progress Notes (Signed)
Patient ID: Tammy Osborne, female    DOB: 09-18-1955  MRN: [id number]  CC: Hypertension and Diabetes   Subjective: Tammy Osborne is a 62 y.o. female who presents for chronic ds management.  Her niece, Tammy Osborne,  is with her and pt requests that she interprets.  Niece is 77 yrs old Her concerns today include:  Patient with history of hypertension, diabetes type 2, probable ILD, HL, anemia  1.  Possible ILD:  Saw Dr. Henrene Pastor  04/25/2017.  Pt doing well.  His conclusion was that she probably had scarring related to bad infection.  He order CXR and repeat PFT. Pt reports she was not aware of the CXR order -endorses itchy throat x 2 wks.  No sneezing, congestion, lacrimation or itchy eyes  2.  DM: compliant with Metformin.  No device for checking BS and does not want one Over due for eye exam.  I encouraged her to get it done -walking 20-35 mins most days  3.  HTN:  Reports compliance with meds and salt restriction No CP/SOB.   4.  C/o intermittent pain in posterior neck x 2 wk.  No initiating factors identified. Radiates to shoulders.   No numbness, tingling in arms unless she sleeps on her hand  Patient Active Problem List   Diagnosis Date Noted  . New onset type 2 diabetes mellitus (HCC) 10/19/2016  . Essential hypertension 09/06/2016  . Interstitial pulmonary disease (HCC) 09/06/2016  . Cough in adult 09/06/2016  . Anemia 09/06/2016  . Hyperlipidemia 09/06/2016  . Numbness of lower limb 09/06/2016     Current Outpatient Medications on File Prior to Visit  Medication Sig Dispense Refill  . ferrous sulfate (FERROUSUL) 325 (65 FE) MG tablet Take 1 tablet (325 mg total) by mouth daily with breakfast. 60 tablet 0  . fluticasone (FLONASE) 50 MCG/ACT nasal spray Place 2 sprays into both nostrils daily. 16 g 0  . gabapentin (NEURONTIN) 100 MG capsule Take 2 capsules (200 mg total) by mouth at bedtime. 60 capsule 3  . lisinopril (PRINIVIL,ZESTRIL) 5 MG tablet Take 1 tablet (5 mg  total) by mouth daily. 90 tablet 3  . metFORMIN (GLUCOPHAGE) 500 MG tablet Take 1 tablet (500 mg total) by mouth daily with breakfast. 90 tablet 3   No current facility-administered medications on file prior to visit.     No Known Allergies  Social History   Socioeconomic History  . Marital status: Married    Spouse name: Not on file  . Number of children: Not on file  . Years of education: Not on file  . Highest education level: Not on file  Occupational History  . Not on file  Social Needs  . Financial resource strain: Not on file  . Food insecurity:    Worry: Not on file    Inability: Not on file  . Transportation needs:    Medical: Not on file    Non-medical: Not on file  Tobacco Use  . Smoking status: Passive Smoke Exposure - Never Smoker  . Smokeless tobacco: Never Used  . Tobacco comment: spouse smoked in home X6-7 years.   Substance and Sexual Activity  . Alcohol use: No  . Drug use: Not on file  . Sexual activity: Not on file  Lifestyle  . Physical activity:    Days per week: Not on file    Minutes per session: Not on file  . Stress: Not on file  Relationships  . Social connections:  Talks on phone: Not on file    Gets together: Not on file    Attends religious service: Not on file    Active member of club or organization: Not on file    Attends meetings of clubs or organizations: Not on file    Relationship status: Not on file  . Intimate partner violence:    Fear of current or ex partner: Not on file    Emotionally abused: Not on file    Physically abused: Not on file    Forced sexual activity: Not on file  Other Topics Concern  . Not on file  Social History Narrative  . Not on file    No family history on file.  Past Surgical History:  Procedure Laterality Date  . ABDOMINAL HYSTERECTOMY      ROS: Review of Systems Neg except as above PHYSICAL EXAM: BP (!) 146/76   Pulse 78   Temp 98.1 F (36.7 C) (Oral)   Resp 16   Wt 121 lb 12.8  oz (55.2 kg)   LMP  (LMP Unknown)   SpO2 95%   BMI 24.60 kg/m   Wt Readings from Last 3 Encounters:  05/30/17 121 lb 12.8 oz (55.2 kg)  04/25/17 122 lb 6.4 oz (55.5 kg)  12/26/16 122 lb (55.3 kg)   Repeat blood pressure 120/62. Physical Exam  General appearance - alert, well appearing, and in no distress Mental status - normal mood, behavior, speech, dress, motor activity, and thought processes Nose - normal and patent, no erythema, discharge or polyps Mouth - mucous membranes moist, pharynx normal without lesions Neck - supple, no significant adenopathy Chest - whistling expiratory sounds Musculoskeletal -mild tenderness on palpation of the C-spine.  Mild discomfort with passive range of motion in all directions.  Good range of motion at both shoulders.  Power upper extremities 5/5. Extremities -lower extremity edema. Diabetic Foot Exam - Simple   Simple Foot Form Visual Inspection See comments:  Yes Sensation Testing See comments:  Yes Pulse Check Posterior Tibialis and Dorsalis pulse intact bilaterally:  Yes Comments Decreased sensation on the sole of the feet right side.  Discolored toenail of the left big toe.     BS 95/ A1C 5.9   ASSESSMENT AND PLAN: 1. Controlled type 2 diabetes mellitus with complication, without long-term current use of insulin (HCC) Commended her on level of control.  Encourage continued regular exercise and healthy eating habits.  Continue metformin. Encouraged to get eye exam done.  I went over affordable places where she can go to have her eye exam.- POCT glucose (manual entry) - POCT glycosylated hemoglobin (Hb A1C)  2. Essential hypertension At goal.  Continue amlodipine and lisinopril. - amLODipine (NORVASC) 10 MG tablet; Take 1 tablet (10 mg total) by mouth daily.  Dispense: 30 tablet; Refill: 6  3. Hyperlipidemia, unspecified hyperlipidemia type - atorvastatin (LIPITOR) 40 MG tablet; Take 1 tablet (40 mg total) by mouth daily.   Dispense: 90 tablet; Refill: 4  4. Acute neck pain No alarming features at this time.  Will treat conservatively with the pain rub and a muscle relaxant.  Patient informed that the muscle relaxant can cause drowsiness. - diclofenac sodium (VOLTAREN) 1 % GEL; Apply 2 g topically 4 (four) times daily.  Dispense: 100 g; Refill: 1 - methocarbamol (ROBAXIN) 500 MG tablet; Take 1 tablet (500 mg total) by mouth 2 (two) times daily as needed for muscle spasms.  Dispense: 30 tablet; Refill: 0  5. Seasonal allergic rhinitis, unspecified  trigger The itchy throat with cough may be due to seasonal allergies or lisinopril.  Give a trial of Claritin.  If no improvement patient told to call me or follow-up at which time we will stop the lisinopril - loratadine (CLARITIN) 10 MG tablet; Take 1 tablet (10 mg total) by mouth daily.  Dispense: 30 tablet; Refill: 11  6. Colon cancer screening - Fecal occult blood, imunochemical([facility])  Patient given instructions on how to go to [facility] radiology to get the chest x-ray done  that was ordered by her pulmonologist.  Patient was given the opportunity to ask questions.  Patient verbalized understanding of the plan and was able to repeat key elements of the plan.   Orders Placed This Encounter  Procedures  . Fecal occult blood, imunochemical([facility])  . POCT glucose (manual entry)  . POCT glycosylated hemoglobin (Hb A1C)     Requested Prescriptions   Signed Prescriptions Disp Refills  . amLODipine (NORVASC) 10 MG tablet 30 tablet 6    Sig: Take 1 tablet (10 mg total) by mouth daily.  Marland Kitchen atorvastatin (LIPITOR) 40 MG tablet 90 tablet 4    Sig: Take 1 tablet (40 mg total) by mouth daily.  Marland Kitchen loratadine (CLARITIN) 10 MG tablet 30 tablet 11    Sig: Take 1 tablet (10 mg total) by mouth daily.  . diclofenac sodium (VOLTAREN) 1 % GEL 100 g 1    Sig: Apply 2 g topically 4 (four) times daily.  . methocarbamol (ROBAXIN) 500 MG tablet 30 tablet 0      Sig: Take 1 tablet (500 mg total) by mouth 2 (two) times daily as needed for muscle spasms.    Return in about 3 months (around 08/30/2017).  Jonah Blue, MD, FACP

## 2017-06-18 ENCOUNTER — Encounter: Payer: Self-pay | Admitting: Pulmonary Disease

## 2017-06-18 ENCOUNTER — Ambulatory Visit (INDEPENDENT_AMBULATORY_CARE_PROVIDER_SITE_OTHER): Payer: No Typology Code available for payment source | Admitting: Pulmonary Disease

## 2017-06-18 DIAGNOSIS — J849 Interstitial pulmonary disease, unspecified: Secondary | ICD-10-CM

## 2017-06-18 LAB — PULMONARY FUNCTION TEST
DL/VA % pred: 104 %
DL/VA: 4.28 ml/min/mmHg/L
DLCO UNC: 11.45 ml/min/mmHg
DLCO unc % pred: 65 %
FEF 25-75 Post: 3.11 L/sec
FEF 25-75 Pre: 2.88 L/sec
FEF2575-%Change-Post: 8 %
FEF2575-%PRED-POST: 153 %
FEF2575-%Pred-Pre: 141 %
FEV1-%Change-Post: 2 %
FEV1-%PRED-PRE: 68 %
FEV1-%Pred-Post: 70 %
FEV1-POST: 1.46 L
FEV1-Pre: 1.42 L
FEV1FVC-%Change-Post: 1 %
FEV1FVC-%PRED-PRE: 116 %
FEV6-%Change-Post: 1 %
FEV6-%Pred-Post: 61 %
FEV6-%Pred-Pre: 60 %
FEV6-POST: 1.58 L
FEV6-PRE: 1.57 L
FEV6FVC-%PRED-POST: 104 %
FEV6FVC-%PRED-PRE: 104 %
FVC-%Change-Post: 1 %
FVC-%PRED-POST: 58 %
FVC-%PRED-PRE: 58 %
FVC-POST: 1.58 L
FVC-PRE: 1.57 L
PRE FEV6/FVC RATIO: 100 %
Post FEV1/FVC ratio: 92 %
Post FEV6/FVC ratio: 100 %
Pre FEV1/FVC ratio: 91 %
RV % PRED: 42 %
RV: 0.75 L
TLC % pred: 69 %
TLC: 2.99 L

## 2017-06-18 NOTE — Progress Notes (Signed)
Patient completed PFT today. Patient could not complete Pleth, tried Nitrogen washout twice. Pt had good effort.

## 2017-06-21 NOTE — Progress Notes (Signed)
LMTCB for pt 

## 2017-06-21 NOTE — Progress Notes (Signed)
TCNVM

## 2017-07-01 ENCOUNTER — Ambulatory Visit: Payer: Self-pay | Attending: Internal Medicine

## 2017-07-11 ENCOUNTER — Telehealth: Payer: Self-pay | Admitting: Pulmonary Disease

## 2017-07-11 NOTE — Telephone Encounter (Addendum)
  Notes recorded by Lupita LeashMcQuaid, Douglas B, MD on 06/18/2017 at 3:03 PM EDT PFT looks stable, keep OV   ATC pt, no answer. Left message for pt to call back.

## 2017-07-11 NOTE — Telephone Encounter (Signed)
Pt's niece Tammy Osborne is calling back [phone]

## 2017-07-11 NOTE — Telephone Encounter (Signed)
Spoke with Byrd HesselbachMaria and advised results. She wants to know when pt should come in for an ov because there is not one scheduled yet. BQ please advise.   Patient Instructions by Lupita LeashMcQuaid, Douglas B, MD at 04/25/2017 11:30 AM  Author: Lupita LeashMcQuaid, Douglas B, MD Author Type: Physician Filed: 04/25/2017 11:29 AM  Note Status: Signed Cosign: Cosign Not Required Encounter Date: 04/25/2017  Editor: Lupita LeashMcQuaid, Douglas B, MD (Physician)    For wheezing in the upper lobes after a respiratory infection: We will repeat a lung function test and chest x-ray today We will call you if either of these tests are abnormal We will plan on seeing you back in 1 year or sooner if needed  For the nerve pain in the right leg: I will refill your gabapentin today, though you need to go back to the doctor who originally prescribed this medicine and have the problem assessed again.

## 2017-07-11 NOTE — Telephone Encounter (Signed)
Attempted to call pt. I did not receive an answer. I have left a message for pt to return our call.  

## 2017-07-11 NOTE — Telephone Encounter (Signed)
Attempted to call pt's niece, Hilda LiasMarie. I did not receive an answer. I have left a message for Hilda LiasMarie to return our call.

## 2017-07-11 NOTE — Telephone Encounter (Signed)
Pt niece Hilda LiasMarie is calling back [phone]

## 2017-07-15 NOTE — Telephone Encounter (Signed)
03/2018 or sooner if symptoms worsen

## 2017-07-15 NOTE — Telephone Encounter (Signed)
Attempted to call pt's niece, Marie. I did not receive an answer. I have left a message for Marie to return our call.  

## 2017-07-15 NOTE — Telephone Encounter (Signed)
Noted.  Will close encounter.  

## 2017-07-15 NOTE — Telephone Encounter (Signed)
Patient and niece was advised to follow up in clinic 03/2018.

## 2017-08-15 ENCOUNTER — Emergency Department (HOSPITAL_COMMUNITY): Payer: Self-pay

## 2017-08-15 ENCOUNTER — Other Ambulatory Visit: Payer: Self-pay

## 2017-08-15 ENCOUNTER — Encounter (HOSPITAL_COMMUNITY): Payer: Self-pay

## 2017-08-15 ENCOUNTER — Emergency Department (HOSPITAL_COMMUNITY)
Admission: EM | Admit: 2017-08-15 | Discharge: 2017-08-15 | Disposition: A | Discharge status: Home / Self Care | Payer: Self-pay | Attending: Emergency Medicine | Admitting: Emergency Medicine

## 2017-08-15 DIAGNOSIS — R109 Unspecified abdominal pain: Secondary | ICD-10-CM | POA: Insufficient documentation

## 2017-08-15 DIAGNOSIS — R1010 Upper abdominal pain, unspecified: Secondary | ICD-10-CM | POA: Insufficient documentation

## 2017-08-15 DIAGNOSIS — Z7984 Long term (current) use of oral hypoglycemic drugs: Secondary | ICD-10-CM | POA: Insufficient documentation

## 2017-08-15 DIAGNOSIS — E119 Type 2 diabetes mellitus without complications: Secondary | ICD-10-CM | POA: Insufficient documentation

## 2017-08-15 DIAGNOSIS — Z7722 Contact with and (suspected) exposure to environmental tobacco smoke (acute) (chronic): Secondary | ICD-10-CM | POA: Insufficient documentation

## 2017-08-15 DIAGNOSIS — Z79899 Other long term (current) drug therapy: Secondary | ICD-10-CM | POA: Insufficient documentation

## 2017-08-15 DIAGNOSIS — I10 Essential (primary) hypertension: Secondary | ICD-10-CM | POA: Insufficient documentation

## 2017-08-15 DIAGNOSIS — D72829 Elevated white blood cell count, unspecified: Secondary | ICD-10-CM | POA: Insufficient documentation

## 2017-08-15 DIAGNOSIS — J219 Acute bronchiolitis, unspecified: Secondary | ICD-10-CM | POA: Insufficient documentation

## 2017-08-15 HISTORY — DX: Type 2 diabetes mellitus without complications: E11.9

## 2017-08-15 LAB — COMPREHENSIVE METABOLIC PANEL
ALBUMIN: 4.2 g/dL (ref 3.5–5.0)
ALK PHOS: 126 U/L (ref 38–126)
ALT: 44 U/L (ref 0–44)
AST: 40 U/L (ref 15–41)
Anion gap: 14 (ref 5–15)
BILIRUBIN TOTAL: 0.2 mg/dL — AB (ref 0.3–1.2)
BUN: 20 mg/dL (ref 8–23)
CALCIUM: 9.4 mg/dL (ref 8.9–10.3)
CO2: 21 mmol/L — AB (ref 22–32)
CREATININE: 0.7 mg/dL (ref 0.44–1.00)
Chloride: 107 mmol/L (ref 98–111)
GFR calc Af Amer: >60 mL/min (ref >60)
GFR calc non Af Amer: >60 mL/min (ref >60)
GLUCOSE: 120 mg/dL — AB (ref 70–99)
Potassium: 3.5 mmol/L (ref 3.5–5.1)
SODIUM: 142 mmol/L (ref 135–145)
TOTAL PROTEIN: 8.1 g/dL (ref 6.5–8.1)

## 2017-08-15 LAB — URINALYSIS, ROUTINE W REFLEX MICROSCOPIC
BILIRUBIN URINE: NEGATIVE
Glucose, UA: NEGATIVE mg/dL
HGB URINE DIPSTICK: NEGATIVE
KETONES UR: NEGATIVE mg/dL
Leukocytes, UA: NEGATIVE
Nitrite: NEGATIVE
Protein, ur: NEGATIVE mg/dL
Specific Gravity, Urine: 1.01 (ref 1.005–1.030)
pH: 5 (ref 5.0–8.0)

## 2017-08-15 LAB — CBC
HCT: 44.5 % (ref 36.0–46.0)
HEMOGLOBIN: 14.6 g/dL (ref 12.0–15.0)
MCH: 27 pg (ref 26.0–34.0)
MCHC: 32.8 g/dL (ref 30.0–36.0)
MCV: 82.4 fL (ref 78.0–100.0)
Platelets: 404 10*3/uL — ABNORMAL HIGH (ref 150–400)
RBC: 5.4 MIL/uL — ABNORMAL HIGH (ref 3.87–5.11)
RDW: 14.3 % (ref 11.5–15.5)
WBC: 29.8 10*3/uL — ABNORMAL HIGH (ref 4.0–10.5)

## 2017-08-15 LAB — LIPASE, BLOOD: Lipase: 40 U/L (ref 11–51)

## 2017-08-15 MED ORDER — AZITHROMYCIN 250 MG PO TABS: 250.0000 mg | ORAL_TABLET | Freq: Every day | ORAL | 0 refills | Status: DC

## 2017-08-15 MED ORDER — FAMOTIDINE IN NACL 20-0.9 MG/50ML-% IV SOLN
20.0000 mg | Freq: Once | INTRAVENOUS | Status: AC
  Administered 2017-08-15: 20 mg via INTRAVENOUS
  Filled 2017-08-15: qty 50

## 2017-08-15 MED ORDER — SODIUM CHLORIDE 0.9 % IV SOLN
Freq: Once | INTRAVENOUS | Status: AC
  Administered 2017-08-15: 11:00:00 via INTRAVENOUS

## 2017-08-15 MED ORDER — IOPAMIDOL (ISOVUE-300) INJECTION 61%
INTRAVENOUS | Status: DC
Start: 2017-08-15 — End: 2017-08-15
  Filled 2017-08-15: qty 100

## 2017-08-15 MED ORDER — HYDROCODONE-ACETAMINOPHEN 5-325 MG PO TABS: 1.0000 | ORAL_TABLET | ORAL | 0 refills | Status: DC | PRN

## 2017-08-15 MED ORDER — MORPHINE SULFATE (PF) 4 MG/ML IV SOLN
4.0000 mg | Freq: Once | INTRAVENOUS | Status: AC
  Administered 2017-08-15: 4 mg via INTRAVENOUS
  Filled 2017-08-15: qty 1

## 2017-08-15 MED ORDER — SODIUM CHLORIDE 0.9 % IV BOLUS
1000.0000 mL | Freq: Once | INTRAVENOUS | Status: AC
  Administered 2017-08-15: 1000 mL via INTRAVENOUS

## 2017-08-15 MED ORDER — ONDANSETRON HCL 4 MG PO TABS: 4.0000 mg | ORAL_TABLET | Freq: Four times a day (QID) | ORAL | 0 refills | Status: DC

## 2017-08-15 MED ORDER — IOPAMIDOL (ISOVUE-300) INJECTION 61%
100.0000 mL | Freq: Once | INTRAVENOUS | Status: AC | PRN
  Administered 2017-08-15: 100 mL via INTRAVENOUS

## 2017-08-15 MED ORDER — ONDANSETRON 4 MG PO TBDP
4.0000 mg | ORAL_TABLET | Freq: Once | ORAL | Status: AC | PRN
  Administered 2017-08-15: 4 mg via ORAL
  Filled 2017-08-15: qty 1

## 2017-08-15 NOTE — ED Provider Notes (Signed)
[facility]-EMERGENCY DEPT Provider Note   CSN: [id number] Arrival date & time: 08/15/17  0506     History   Chief Complaint Chief Complaint  Patient presents with  . Abdominal Pain  . Emesis  . Diarrhea    HPI Tammy Osborne is a 62 y.o. female.  Level 5 caveat for acuity of condition.  Patient presents with bilateral upper abdominal pain since 2 AM today.  No prodromal illnesses.  No vomiting, diarrhea, substernal chest pain, dyspnea, cough, fever, sweats, chills, dysuria, vaginal bleeding.  Past medical history hypertension, diabetes, interstitial pulmonary disease, anemia, hyperlipidemia.     Past Medical History:  Diagnosis Date  . Diabetes mellitus without complication (HCC)   . Hypertension     Patient Active Problem List   Diagnosis Date Noted  . New onset type 2 diabetes mellitus (HCC) 10/19/2016  . Essential hypertension 09/06/2016  . Interstitial pulmonary disease (HCC) 09/06/2016  . Cough in adult 09/06/2016  . Anemia 09/06/2016  . Hyperlipidemia 09/06/2016  . Numbness of lower limb 09/06/2016    Past Surgical History:  Procedure Laterality Date  . ABDOMINAL HYSTERECTOMY       OB History   None      Home Medications    Prior to Admission medications   Medication Sig Start Date End Date Taking? Authorizing Provider  amLODipine (NORVASC) 10 MG tablet Take 1 tablet (10 mg total) by mouth daily. 05/30/17  Yes Marcine MatarJohnson, Deborah B, MD  atorvastatin (LIPITOR) 40 MG tablet Take 1 tablet (40 mg total) by mouth daily. 05/30/17  Yes Marcine MatarJohnson, Deborah B, MD  diclofenac sodium (VOLTAREN) 1 % GEL Apply 2 g topically 4 (four) times daily. 05/30/17  Yes Marcine MatarJohnson, Deborah B, MD  gabapentin (NEURONTIN) 100 MG capsule Take 2 capsules (200 mg total) by mouth at bedtime. 09/06/16  Yes Marcine MatarJohnson, Deborah B, MD  lisinopril (PRINIVIL,ZESTRIL) 5 MG tablet Take 1 tablet (5 mg total) by mouth daily. 12/24/16  Yes Marcine MatarJohnson, Deborah B, MD  metFORMIN (GLUCOPHAGE) 500  MG tablet Take 1 tablet (500 mg total) by mouth daily with breakfast. 10/19/16  Yes Marcine MatarJohnson, Deborah B, MD  methocarbamol (ROBAXIN) 500 MG tablet Take 1 tablet (500 mg total) by mouth 2 (two) times daily as needed for muscle spasms. 05/30/17  Yes Marcine MatarJohnson, Deborah B, MD  azithromycin (ZITHROMAX) 250 MG tablet Take 1 tablet (250 mg total) by mouth daily. Take first 2 tablets together, then 1 every day until finished. 08/15/17   Donnetta Hutchingook, Fayette Hamada, MD  HYDROcodone-acetaminophen (NORCO/VICODIN) 5-325 MG tablet Take 1 tablet by mouth every 4 (four) hours as needed. 08/15/17   Donnetta Hutchingook, Adelbert Gaspard, MD  ondansetron (ZOFRAN) 4 MG tablet Take 1 tablet (4 mg total) by mouth every 6 (six) hours. 08/15/17   Donnetta Hutchingook, Natasha Burda, MD    Family History History reviewed. No pertinent family history.  Social History Social History   Tobacco Use  . Smoking status: Passive Smoke Exposure - Never Smoker  . Smokeless tobacco: Never Used  . Tobacco comment: spouse smoked in home X6-7 years.   Substance Use Topics  . Alcohol use: No  . Drug use: Not on file     Allergies   Patient has no known allergies.   Review of Systems Review of Systems  Unable to perform ROS: Acuity of condition     Physical Exam Updated Vital Signs BP (!) 152/93 (BP Location: Left Arm)   Pulse 97   Temp 98.3 F (36.8 C) (Oral)   Resp 15  Ht 5' (1.524 m)   Wt 54.4 kg (120 lb)   LMP  (LMP Unknown)   SpO2 95%   BMI 23.44 kg/m   Physical Exam  Constitutional: She is oriented to person, place, and time. She appears well-developed and well-nourished.  HENT:  Head: Normocephalic and atraumatic.  Eyes: Conjunctivae are normal.  Neck: Neck supple.  Cardiovascular: Normal rate and regular rhythm.  Pulmonary/Chest: Effort normal and breath sounds normal.  Abdominal: Soft. Bowel sounds are normal.  Tender bilat upper abdomen  Musculoskeletal: Normal range of motion.  Neurological: She is alert and oriented to person, place, and time.  Skin: Skin is  warm and dry.  Psychiatric: She has a normal mood and affect. Her behavior is normal.  Nursing note and vitals reviewed.    ED Treatments / Results  Labs (all labs ordered are listed, but only abnormal results are displayed) Labs Reviewed  COMPREHENSIVE METABOLIC PANEL - Abnormal; Notable for the following components:      Result Value   CO2 21 (*)    Glucose, Bld 120 (*)    Total Bilirubin 0.2 (*)    All other components within normal limits  CBC - Abnormal; Notable for the following components:   WBC 29.8 (*)    RBC 5.40 (*)    Platelets 404 (*)    All other components within normal limits  URINALYSIS, ROUTINE W REFLEX MICROSCOPIC - Abnormal; Notable for the following components:   Color, Urine STRAW (*)    All other components within normal limits  LIPASE, BLOOD    EKG None  Radiology Ct Abdomen Pelvis W Contrast  Result Date: 08/15/2017 CLINICAL DATA:  Acute diffuse abdominal pain, some nausea, vomiting, and diarrhea over the last few hours EXAM: CT ABDOMEN AND PELVIS WITH CONTRAST TECHNIQUE: Multidetector CT imaging of the abdomen and pelvis was performed using the standard protocol following bolus administration of intravenous contrast. CONTRAST:  ISOVUE-300 IOPAMIDOL (ISOVUE-300) INJECTION 61% COMPARISON:  None. FINDINGS: Lower chest: There is a somewhat mosaic appearance of perfusion of the lungs. This could indicate small airways disease as bronchiolitis but is nonspecific. No effusion is seen. Coronary artery calcifications are noted and the heart is mildly enlarged. There may be a small hiatal hernia present. Hepatobiliary: The liver enhances with no focal abnormality and no ductal dilatation is seen. No calcified gallstones are noted. Pancreas: The pancreas is normal in size and the pancreatic duct is not dilated. Spleen: The spleen is unremarkable. Adrenals/Urinary Tract: The adrenal glands appear normal. The kidneys enhance and multiple small low-attenuation  structures are scattered bilaterally most consistent with small cysts, but too small to characterize well. On delayed images, the pelvocaliceal systems are unremarkable and no hydronephrosis is seen. The ureters are normal in caliber. The urinary bladder is not well distended but no abnormality is evident. Stomach/Bowel: The stomach is not well distended but no abnormality is seen. No small bowel distention is evident. The colon is largely decompressed. The t 1. The appendix and terminal ileum are unremarkable. 2. Age advanced abdominal aortic atherosclerosis. erminal ileum is unremarkable, as is the appendix. Vascular/Lymphatic: The abdominal aorta is normal in caliber with significant age advanced abdominal aortic atherosclerosis noted. No adenopathy is seen. Reproductive: The uterus by history has been removed. There does appear to be a small left anterior ovarian cyst of 19 mm in diameter. No free fluid is seen within the pelvis. Other: No abdominal wall hernia is noted. Haziness in the soft tissues focally within the  upper buttocks bilaterally may be due to recent injections. Musculoskeletal: The lumbar vertebrae are in normal alignment although there appears to be partial sacralization of L5. Intervertebral disc spaces are unremarkable. The SI joints appear corticated IMPRESSION: 1. No definite explanation for the patient's abdominal pain is seen. 2. Somewhat mosaic appearance of the lung bases could indicate small airways disease as bronchiolitis. Correlate clinically. 3. Coronary artery calcifications diffusely. Electronically Signed   By: Dwyane Dee M.D.   On: 08/15/2017 11:29    Procedures Procedures (including critical care time)  Medications Ordered in ED Medications  iopamidol (ISOVUE-300) 61 % injection (has no administration in time range)  ondansetron (ZOFRAN-ODT) disintegrating tablet 4 mg (4 mg Oral Given 08/15/17 0635)  sodium chloride 0.9 % bolus 1,000 mL (0 mLs Intravenous Stopped  08/15/17 0919)  morphine 4 MG/ML injection 4 mg (4 mg Intravenous Given 08/15/17 0818)  famotidine (PEPCID) IVPB 20 mg premix (0 mg Intravenous Stopped 08/15/17 0918)  0.9 %  sodium chloride infusion ( Intravenous New Bag/Given 08/15/17 1111)  iopamidol (ISOVUE-300) 61 % injection 100 mL (100 mLs Intravenous Contrast Given 08/15/17 1040)     Initial Impression / Assessment and Plan / ED Course  I have reviewed the triage vital signs and the nursing notes.  Pertinent labs & imaging results that were available during my care of the patient were reviewed by me and considered in my medical decision making (see chart for details).     Patient presents with upper abdominal pain.  She has not toxic appearing or septic.  However, her white count is 29K.  CT abdomen pelvis shows no obvious abdominal pathology.  However there is a mosaic pattern of her lower airway which could represent a bronchiolitis.  I am not sure this would explain her leukocytosis.  Patient feels much better after IV fluids, IV morphine, IV Pepcid, IV Zofran.  No acute abdomen at discharge.  Discharge medications Zithromax, Vicodin, Zofran 4 mg.  I discussed the elevated white blood cell count with the patient and her husband and the CT report.  Final Clinical Impressions(s) / ED Diagnoses   Final diagnoses:  Abdominal pain, unspecified abdominal location  Leukocytosis, unspecified type  Bronchiolitis    ED Discharge Orders        Ordered    HYDROcodone-acetaminophen (NORCO/VICODIN) 5-325 MG tablet  Every 4 hours PRN     08/15/17 1409    ondansetron (ZOFRAN) 4 MG tablet  Every 6 hours     08/15/17 1409    azithromycin (ZITHROMAX) 250 MG tablet  Daily     08/15/17 1415       Donnetta Hutching, MD 08/15/17 1433

## 2017-08-15 NOTE — ED Triage Notes (Signed)
Pt presents to ED from home for N/V/D. Pt reports that is started a few hours ago. No fevers. No sick contacts.

## 2017-08-15 NOTE — Discharge Instructions (Addendum)
Your white blood cell count was elevated.  These of the blood cells that fight infection.  However, your CT scan was read as a possible infection in your lower lungs.  Prescription for antibiotic,pain and nausea medicine.  Return here for worsening pain, fever, chills, dehydration.

## 2017-09-19 ENCOUNTER — Encounter: Payer: Self-pay | Admitting: Internal Medicine

## 2017-09-19 ENCOUNTER — Ambulatory Visit: Payer: Self-pay | Attending: Internal Medicine | Admitting: Internal Medicine

## 2017-09-19 VITALS — BP 159/82 | HR 74 | Temp 98.0°F | Resp 16 | Wt 122.4 lb

## 2017-09-19 DIAGNOSIS — D649 Anemia, unspecified: Secondary | ICD-10-CM | POA: Insufficient documentation

## 2017-09-19 DIAGNOSIS — I10 Essential (primary) hypertension: Secondary | ICD-10-CM

## 2017-09-19 DIAGNOSIS — M542 Cervicalgia: Secondary | ICD-10-CM

## 2017-09-19 DIAGNOSIS — R350 Frequency of micturition: Secondary | ICD-10-CM | POA: Insufficient documentation

## 2017-09-19 DIAGNOSIS — Z7984 Long term (current) use of oral hypoglycemic drugs: Secondary | ICD-10-CM | POA: Insufficient documentation

## 2017-09-19 DIAGNOSIS — Z79899 Other long term (current) drug therapy: Secondary | ICD-10-CM | POA: Insufficient documentation

## 2017-09-19 DIAGNOSIS — R300 Dysuria: Secondary | ICD-10-CM

## 2017-09-19 DIAGNOSIS — Z90710 Acquired absence of both cervix and uterus: Secondary | ICD-10-CM | POA: Insufficient documentation

## 2017-09-19 DIAGNOSIS — Z23 Encounter for immunization: Secondary | ICD-10-CM

## 2017-09-19 DIAGNOSIS — D72829 Elevated white blood cell count, unspecified: Secondary | ICD-10-CM

## 2017-09-19 DIAGNOSIS — Z1211 Encounter for screening for malignant neoplasm of colon: Secondary | ICD-10-CM

## 2017-09-19 DIAGNOSIS — R197 Diarrhea, unspecified: Secondary | ICD-10-CM | POA: Insufficient documentation

## 2017-09-19 DIAGNOSIS — M25512 Pain in left shoulder: Secondary | ICD-10-CM

## 2017-09-19 DIAGNOSIS — E785 Hyperlipidemia, unspecified: Secondary | ICD-10-CM | POA: Insufficient documentation

## 2017-09-19 DIAGNOSIS — Z7722 Contact with and (suspected) exposure to environmental tobacco smoke (acute) (chronic): Secondary | ICD-10-CM | POA: Insufficient documentation

## 2017-09-19 DIAGNOSIS — E119 Type 2 diabetes mellitus without complications: Secondary | ICD-10-CM

## 2017-09-19 LAB — POCT URINALYSIS DIP (CLINITEK)
BILIRUBIN UA: NEGATIVE mg/dL
Bilirubin, UA: NEGATIVE
Glucose, UA: NEGATIVE mg/dL
Leukocytes, UA: NEGATIVE
Nitrite, UA: NEGATIVE
PH UA: 5 (ref 5.0–8.0)
PROTEIN: NEGATIVE
SPEC GRAV UA: 1.02 (ref 1.010–1.025)
UROBILINOGEN UA: 0.2 U/dL

## 2017-09-19 LAB — GLUCOSE, POCT (MANUAL RESULT ENTRY): POC Glucose: 91 mg/dl (ref 70–99)

## 2017-09-19 MED ORDER — METHOCARBAMOL 500 MG PO TABS: 500.0000 mg | ORAL_TABLET | Freq: Two times a day (BID) | ORAL | 0 refills | Status: DC | PRN

## 2017-09-19 MED ORDER — METFORMIN HCL 500 MG PO TABS: 500.0000 mg | ORAL_TABLET | Freq: Every day | ORAL | 3 refills | Status: DC

## 2017-09-19 MED ORDER — LISINOPRIL 5 MG PO TABS: 5.0000 mg | ORAL_TABLET | Freq: Every day | ORAL | 3 refills | Status: DC

## 2017-09-19 NOTE — Patient Instructions (Signed)
Influenza Virus Vaccine injection (Fluarix) Qu es este medicamento? La VACUNA ANTIGRIPAL ayuda a disminuir el riesgo de contraer la influenza, tambin conocida como la gripe. La vacuna solo ayuda a protegerle contra algunas cepas de influenza. Esta vacuna no ayuda a reducir el riesgo de contraer influenza pandmica H1N1. Este medicamento puede ser utilizado para otros usos; si tiene alguna pregunta consulte con su proveedor de atencin mdica o con su farmacutico. MARCAS COMUNES: Fluarix, Fluzone Qu le debo informar a mi profesional de la salud antes de tomar este medicamento? Necesita saber si usted presenta alguno de los siguientes problemas o situaciones: -trastorno de sangrado como hemofilia -fiebre o infeccin -sndrome de Guillain-Barre u otros problemas neurolgicos -problemas del sistema inmunolgico -infeccin por el virus de la inmunodeficiencia humana (VIH) o SIDA -niveles bajos de plaquetas en la sangre -esclerosis mltiple -una reaccin alrgica o inusual a las vacunas antigripales, a los huevos, protenas de pollo, al ltex, a la gentamicina, a otros medicamentos, alimentos, colorantes o conservantes -si est embarazada o buscando quedar embarazada -si est amamantando a un beb Cmo debo utilizar este medicamento? Esta vacuna se administra mediante inyeccin por va intramuscular. Lo administra un profesional de la salud. Recibir una copia de informacin escrita sobre la vacuna antes de cada vacuna. Asegrese de leer este folleto cada vez cuidadosamente. Este folleto puede cambiar con frecuencia. Hable con su pediatra para informarse acerca del uso de este medicamento en nios. Puede requerir atencin especial. Sobredosis: Pngase en contacto inmediatamente con un centro toxicolgico o una sala de urgencia si usted cree que haya tomado demasiado medicamento. ATENCIN: Este medicamento es solo para usted. No comparta este medicamento con nadie. Qu sucede si me olvido de  una dosis? No se aplica en este caso. Qu puede interactuar con este medicamento? -quimioterapia o radioterapia -medicamentos que suprimen el sistema inmunolgico, tales como etanercept, anakinra, infliximab y adalimumab -medicamentos que tratan o previenen cogulos sanguneos, como warfarina -fenitona -medicamentos esteroideos, como la prednisona o la cortisona -teofilina -vacunas Puede ser que esta lista no menciona todas las posibles interacciones. Informe a su profesional de la salud de todos los productos a base de hierbas, medicamentos de venta libre o suplementos nutritivos que est tomando. Si usted fuma, consume bebidas alcohlicas o si utiliza drogas ilegales, indqueselo tambin a su profesional de la salud. Algunas sustancias pueden interactuar con su medicamento. A qu debo estar atento al usar este medicamento? Informe a su mdico o a su profesional de la salud sobre todos los efectos secundarios que persistan despus de 3 das. Llame a su proveedor de atencin mdica si se presentan sntomas inusuales dentro de las 6 semanas posteriores a la vacunacin. Es posible que todava pueda contraer la gripe, pero la enfermedad no ser tan fuerte como normalmente. No puede contraer la gripe de esta vacuna. La vacuna antigripal no le protege contra resfros u otras enfermedades que pueden causar fiebre. Debe vacunarse cada ao. Qu efectos secundarios puedo tener al utilizar este medicamento? Efectos secundarios que debe informar a su mdico o a su profesional de la salud tan pronto como sea posible: -reacciones alrgicas como erupcin cutnea, picazn o urticarias, hinchazn de la cara, labios o lengua Efectos secundarios que, por lo general, no requieren atencin mdica (debe informarlos a su mdico o a su profesional de la salud si persisten o si son molestos): -fiebre -dolor de cabeza -molestias y dolores musculares -dolor, sensibilidad, enrojecimiento o hinchazn en el lugar de la  inyeccin -cansancio o debilidad Puede ser que esta lista no   menciona todos los posibles efectos secundarios. Comunquese a su mdico por asesoramiento mdico sobre los efectos secundarios. Usted puede informar los efectos secundarios a la FDA por telfono al 1-800-FDA-1088. Dnde debo guardar mi medicina? Esta vacuna se administra solamente en clnicas, farmacias, consultorio mdico u otro consultorio de un profesional de la salud y no necesitar guardarlo en su domicilio. ATENCIN: Este folleto es un resumen. Puede ser que no cubra toda la posible informacin. Si usted tiene preguntas acerca de esta medicina, consulte con su mdico, su farmacutico o su profesional de la salud.  2018 Elsevier/Gold Standard (2009-07-19 15:31:40)  

## 2017-09-19 NOTE — Progress Notes (Signed)
Patient ID: Tammy Osborne, female    DOB: 22-Sep-1955  MRN: [id number]  CC: Diabetes and Hypertension   Subjective: Tammy Osborne is a 62 y.o. female who presents for chronic ds management.  Her concerns today include:  Patient with history of hypertension, diabetes type 2, probable ILD, HL, anemia  Seen in ER 08/15/2017 with upper abdominal pain.  Pt states she was having a lot of abdominal pain and diarrhea.  She was noted to have a significant leukocytosis. CT abdomen was negative for any acute findings.  Incidental findings of coronary artery Ca+ and mosaic appearance of the lung bases ? Due to small airways ds like bronchiolitis.  She reports that her abdominal symptoms and diarrhea have since resolved.  C/o urinary frequency with supra-pubic discomfort with urination x 1 day.  No fever of blood in the urine.  C/o pain LT shoulder x 1 wk Constant.  No radiation, numbness or tingling. Thinks it started after she was doing a lot of moving furniture at her house. Using Voltaren Gel which helps.  Would like a refill on Robaxin.  DM:  No device to check BS Compliant with Metformin Does well with eating habits.  She tries to stay active.  HTN:  Compliant with Lisinopril and Norvasc.  Out of Norvasc x 1 wk   HM:  Given FIT test on last visit.  She used and mailed it but we did not get the results.  She has not been able to afford an eye exam.  Patient Active Problem List   Diagnosis Date Noted  . New onset type 2 diabetes mellitus ([place]) 10/19/2016  . Essential hypertension 09/06/2016  . Interstitial pulmonary disease ([place]) 09/06/2016  . Cough in adult 09/06/2016  . Anemia 09/06/2016  . Hyperlipidemia 09/06/2016  . Numbness of lower limb 09/06/2016     Current Outpatient Medications on File Prior to Visit  Medication Sig Dispense Refill  . amLODipine (NORVASC) 10 MG tablet Take 1 tablet (10 mg total) by mouth daily. 30 tablet 6  . atorvastatin (LIPITOR) 40 MG tablet Take 1  tablet (40 mg total) by mouth daily. 90 tablet 4  . diclofenac sodium (VOLTAREN) 1 % GEL Apply 2 g topically 4 (four) times daily. 100 g 1   No current facility-administered medications on file prior to visit.     No Known Allergies  Social History   Socioeconomic History  . Marital status: Married    Spouse name: Not on file  . Number of children: Not on file  . Years of education: Not on file  . Highest education level: Not on file  Occupational History  . Not on file  Social Needs  . Financial resource strain: Not on file  . Food insecurity:    Worry: Not on file    Inability: Not on file  . Transportation needs:    Medical: Not on file    Non-medical: Not on file  Tobacco Use  . Smoking status: Passive Smoke Exposure - Never Smoker  . Smokeless tobacco: Never Used  . Tobacco comment: spouse smoked in home X6-7 years.   Substance and Sexual Activity  . Alcohol use: No  . Drug use: Not on file  . Sexual activity: Not on file  Lifestyle  . Physical activity:    Days per week: Not on file    Minutes per session: Not on file  . Stress: Not on file  Relationships  . Social connections:    Talks on  phone: Not on file    Gets together: Not on file    Attends religious service: Not on file    Active member of club or organization: Not on file    Attends meetings of clubs or organizations: Not on file    Relationship status: Not on file  . Intimate partner violence:    Fear of current or ex partner: Not on file    Emotionally abused: Not on file    Physically abused: Not on file    Forced sexual activity: Not on file  Other Topics Concern  . Not on file  Social History Narrative  . Not on file    No family history on file.  Past Surgical History:  Procedure Laterality Date  . ABDOMINAL HYSTERECTOMY      ROS: Review of Systems Neg except as above  PHYSICAL EXAM: BP (!) 159/82   Pulse 74   Temp 98 F (36.7 C) (Oral)   Resp 16   Wt 122 lb 6.4 oz (55.5  kg)   LMP  (LMP Unknown)   SpO2 96%   BMI 23.90 kg/m   Physical Exam  General appearance - alert, well appearing, and in no distress Mental status - normal mood, behavior, speech, dress, motor activity, and thought processes Neck - supple, no significant adenopathy Chest -popping sounds in both lungs on expiration this is chronic Heart - normal rate, regular rhythm, normal S1, S2, no murmurs, rubs, clicks or gallops Abdomen: Normal bowel sounds, soft and nontender.  No palpable masses.  No organomegaly. Musculoskeletal -left shoulder: Mild tenderness on palpation over the posterior joint line.  She has good range of motion in all directions.   Extremities - peripheral pulses normal, no pedal edema, no clubbing or cyanosis   Results for orders placed or performed in visit on 09/19/17  POCT glucose (manual entry)  Result Value Ref Range   POC Glucose 91 70 - 99 mg/dl  POCT URINALYSIS DIP (CLINITEK)  Result Value Ref Range   Color, UA yellow yellow   Clarity, UA clear clear   Glucose, UA negative negative mg/dL   Bilirubin, UA negative negative   Ketones, POC UA negative negative mg/dL   Spec Grav, UA 1.020 1.010 - 1.025   Blood, UA trace-intact (A) negative   pH, UA 5.0 5.0 - 8.0   POC Protein UA Negative Negative, Trace   Urobilinogen, UA 0.2 0.2 or 1.0 E.U./dL   Nitrite, UA Negative Negative   Leukocytes, UA Negative Negative   Lab Results  Component Value Date   HGBA1C 5.9 05/30/2017   Lab Results  Component Value Date   WBC 29.8 (H) 08/15/2017   HGB 14.6 08/15/2017   HCT 44.5 08/15/2017   MCV 82.4 08/15/2017   PLT 404 (H) 08/15/2017    ASSESSMENT AND PLAN: 1. Controlled type 2 diabetes mellitus without complication, without long-term current use of insulin (HCC) Continue metformin.  Continue healthy eating habits. - POCT glucose (manual entry) - Microalbumin / creatinine urine ratio - CBC with Differential/Platelet - Lipid panel - metFORMIN (GLUCOPHAGE) 500  MG tablet; Take 1 tablet (500 mg total) by mouth daily with breakfast.  Dispense: 90 tablet; Refill: 3  2. Essential hypertension Not at goal.  Patient plans to pick up a refill on the Norvasc today.  Continue current dose of lisinopril. - lisinopril (PRINIVIL,ZESTRIL) 5 MG tablet; Take 1 tablet (5 mg total) by mouth daily.  Dispense: 90 tablet; Refill: 3  3. Colon cancer screening We  will give her another fit kit to use and mail in for colon cancer screening - Fecal occult blood, imunochemical([facility]/Sun[facility])  4. Need for immunization against influenza - Flu Vaccine QUAD 36+ mos IM  5. Dysuria Urine dip was negative. - POCT URINALYSIS DIP (CLINITEK)  6. Acute pain of left shoulder MSK in nature.  She can continue the Voltaren gel.  Refill given on Robaxin - methocarbamol (ROBAXIN) 500 MG tablet; Take 1 tablet (500 mg total) by mouth 2 (two) times daily as needed for muscle spasms.  Dispense: 30 tablet; Refill: 0  7. Leukocytosis, unspecified type Plan to repeat CBC today.   Patient was given the opportunity to ask questions.  Patient verbalized understanding of the plan and was able to repeat key elements of the plan.  [employer] interpreter used during this encounter. # [id number]  Orders Placed This Encounter  Procedures  . Fecal occult blood, imunochemical([facility]/Sunquest)  . Flu Vaccine QUAD 36+ mos IM  . Microalbumin / creatinine urine ratio  . CBC with Differential/Platelet  . Lipid panel  . POCT glucose (manual entry)  . POCT URINALYSIS DIP (CLINITEK)     Requested Prescriptions   Signed Prescriptions Disp Refills  . lisinopril (PRINIVIL,ZESTRIL) 5 MG tablet 90 tablet 3    Sig: Take 1 tablet (5 mg total) by mouth daily.  . metFORMIN (GLUCOPHAGE) 500 MG tablet 90 tablet 3    Sig: Take 1 tablet (500 mg total) by mouth daily with breakfast.  . methocarbamol (ROBAXIN) 500 MG tablet 30 tablet 0    Sig: Take 1 tablet (500 mg total) by mouth 2 (two) times daily as needed  for muscle spasms.    Return in about 3 months (around 12/20/2017).  Karle Plumber, MD, FACP

## 2017-09-20 LAB — MICROALBUMIN / CREATININE URINE RATIO
Creatinine, Urine: 142.8 mg/dL
Microalb/Creat Ratio: 7.2 mg/g creat (ref 0.0–30.0)
Microalbumin, Urine: 10.3 ug/mL

## 2017-09-20 LAB — CBC WITH DIFFERENTIAL/PLATELET
Basophils Absolute: 0 10*3/uL (ref 0.0–0.2)
Basos: 0 %
EOS (ABSOLUTE): 0 10*3/uL (ref 0.0–0.4)
EOS: 0 %
HEMATOCRIT: 42.4 % (ref 34.0–46.6)
HEMOGLOBIN: 13.6 g/dL (ref 11.1–15.9)
Immature Grans (Abs): 0 10*3/uL (ref 0.0–0.1)
Immature Granulocytes: 0 %
LYMPHS ABS: 4.3 10*3/uL — AB (ref 0.7–3.1)
Lymphs: 42 %
MCH: 26.3 pg — ABNORMAL LOW (ref 26.6–33.0)
MCHC: 32.1 g/dL (ref 31.5–35.7)
MCV: 82 fL (ref 79–97)
MONOCYTES: 7 %
Monocytes Absolute: 0.7 10*3/uL (ref 0.1–0.9)
NEUTROS ABS: 5.1 10*3/uL (ref 1.4–7.0)
Neutrophils: 51 %
Platelets: 330 10*3/uL (ref 150–450)
RBC: 5.17 x10E6/uL (ref 3.77–5.28)
RDW: 15.1 % (ref 12.3–15.4)
WBC: 10.1 10*3/uL (ref 3.4–10.8)

## 2017-09-20 LAB — LIPID PANEL
CHOL/HDL RATIO: 5.4 ratio — AB (ref 0.0–4.4)
Cholesterol, Total: 193 mg/dL (ref 100–199)
HDL: 36 mg/dL — ABNORMAL LOW (ref >39)
LDL CALC: 89 mg/dL (ref 0–99)
Triglycerides: 338 mg/dL — ABNORMAL HIGH (ref 0–149)
VLDL CHOLESTEROL CAL: 68 mg/dL — AB (ref 5–40)

## 2017-09-24 ENCOUNTER — Telehealth: Payer: Self-pay

## 2017-09-24 NOTE — Telephone Encounter (Signed)
[employer] Marquita PalmsMario  ZO#[id number] contacted pt to go over lab results pt didn't answer left a detailed vm informing pt of results and if she has any questions or concerns to give me a call   If pt calls back please give results: blood count is normal. Triglyceride is elevated at 338 with goal being less than 150. Tell her to take atorvastatin daily as prescribed. Healthy eating and regular exercise will also help to lower triglycerides.

## 2017-09-24 NOTE — Telephone Encounter (Deleted)
-----   Message from Marcine Matareborah B Johnson, MD sent at 09/20/2017  5:46 PM EDT ----- Let patient know that her blood count is normal.  Triglyceride is elevated at 338 with goal being less than 150.  Tell her to take atorvastatin daily as prescribed.  Healthy eating and regular exercise will also help to lower triglycerides.

## 2017-09-28 LAB — FECAL OCCULT BLOOD, IMMUNOCHEMICAL: FECAL OCCULT BLD: NEGATIVE

## 2017-10-23 ENCOUNTER — Ambulatory Visit (INDEPENDENT_AMBULATORY_CARE_PROVIDER_SITE_OTHER): Payer: Self-pay | Admitting: Pulmonary Disease

## 2017-10-23 ENCOUNTER — Encounter: Payer: Self-pay | Admitting: Pulmonary Disease

## 2017-10-23 ENCOUNTER — Other Ambulatory Visit: Payer: Self-pay | Admitting: Internal Medicine

## 2017-10-23 VITALS — BP 130/68 | HR 88 | Ht 58.07 in | Wt 122.2 lb

## 2017-10-23 DIAGNOSIS — M549 Dorsalgia, unspecified: Secondary | ICD-10-CM

## 2017-10-23 DIAGNOSIS — I10 Essential (primary) hypertension: Secondary | ICD-10-CM

## 2017-10-23 DIAGNOSIS — R062 Wheezing: Secondary | ICD-10-CM

## 2017-10-23 DIAGNOSIS — J849 Interstitial pulmonary disease, unspecified: Secondary | ICD-10-CM

## 2017-10-23 NOTE — Progress Notes (Signed)
Subjective:    Patient ID: Tammy Osborne, female    DOB: 14-Aug-1955, 62 y.o.   MRN: [id number]  Synopsis: Referred for ILD in 2018.  She is a lifelong non-smoker.  Lung function testing showed some restriction, CT scanning of the chest showed nonspecific groundglass changes in an upper lobe predominant fashion.  Blood work did not show evidence of an underlying connective tissue disease.  HPI Chief Complaint  Patient presents with  . Follow-up    report back/lung pain    Tammy Osborne returns to clinic today stating that she has had some neck and back pain which has been persistent.  She has seen her primary care physician for this and she has been prescribed Robaxin.  She says that it is not very helpful.  She denies any problems breathing.  She says that she can exercise and does not have to stop because of her lungs.  She does not have cough.  Occasionally she will produce a little bit of mucus but not very often.   Past Medical History:  Diagnosis Date  . Diabetes mellitus without complication (HCC)   . Hypertension        Review of Systems  Constitutional: Negative for fever and unexpected weight change.  HENT: Positive for congestion. Negative for dental problem, ear pain, nosebleeds, postnasal drip, rhinorrhea, sinus pressure, sneezing, sore throat and trouble swallowing.   Respiratory: Positive for cough. Negative for chest tightness, shortness of breath and wheezing.   Cardiovascular: Negative for palpitations and leg swelling.  Neurological: Facial asymmetry: .dbmfu.       Objective:   Physical Exam  Vitals:   10/23/17 1000  BP: 130/68  Pulse: 88  SpO2: 95%  Weight: 122 lb 3.2 oz (55.4 kg)  Height: 4' 10.07" (1.475 m)    Gen: well appearing HENT: OP clear, TM's clear, neck supple PULM: Wheezing worse in upper lobes B, normal percussion CV: RRR, no mgr, trace edema GI: BS+, soft, nontender Derm: no cyanosis or rash Psyche: normal mood and  affect     CBC    Component Value Date/Time   WBC 10.1 09/19/2017 1208   WBC 29.8 (H) 08/15/2017 0611   RBC 5.17 09/19/2017 1208   RBC 5.40 (H) 08/15/2017 0611   HGB 13.6 09/19/2017 1208   HCT 42.4 09/19/2017 1208   PLT 330 09/19/2017 1208   MCV 82 09/19/2017 1208   MCH 26.3 (L) 09/19/2017 1208   MCH 27.0 08/15/2017 0611   MCHC 32.1 09/19/2017 1208   MCHC 32.8 08/15/2017 0611   RDW 15.1 09/19/2017 1208   LYMPHSABS 4.3 (H) 09/19/2017 1208   MONOABS 0.9 10/15/2016 1553   EOSABS 0.0 09/19/2017 1208   BASOSABS 0.0 09/19/2017 1208    BMET    Component Value Date/Time   NA 142 08/15/2017 0611   NA 143 09/06/2016 1036   K 3.5 08/15/2017 0611   CL 107 08/15/2017 0611   CO2 21 (L) 08/15/2017 0611   GLUCOSE 120 (H) 08/15/2017 0611   BUN 20 08/15/2017 0611   BUN 22 09/06/2016 1036   CREATININE 0.70 08/15/2017 0611   CALCIUM 9.4 08/15/2017 0611   GFRNONAA >60 08/15/2017 0611   GFRAA >60 08/15/2017 0611   Chest imaging: August 2018 CT chest images independently reviewed showing diffuse groundglass bilaterally and a bronco vascular distribution with clear air trapping. No pulmonary parenchymal fibrosis seen.  Pulmonary function test: October 2018 ratio normal, FVC 1.76 L 64% predicted, total lung capacity 2.81 L 65% predicted,  DLCO 11.90 67% predicted May 2019 PFT FVC 1.58L (58% pred), DLCO 11.26mL (65% pred)  Echo: 11/2016 LVEF normal, RV normal, no evidence of pulmonary hypertension  Lab: September 2018 eosinophil count 0, anti-Jo 1-, centromere antibody negative, SSA/SSB negative, SCL 70-, rheumatoid factor negative, hypersensitivity pneumonitis negative.     Assessment & Plan:  Interstitial pulmonary disease (HCC)  Wheezing  Back pain, unspecified back location, unspecified back pain laterality, unspecified chronicity  Discussion: Tammy Osborne returns to clinic today with ongoing wheezing but no objective finding to suggest that what ever is causing this is  worsening.  As discussed previously I have many times considered whether or not we should do a lung biopsy or a bronchoscopy but considering the fact that she has no symptoms of shortness of breath or cough I cannot justify doing 1 of these procedures right now.  Further, lung function testing has not shown progression.  I still want to follow her very closely to make sure that what ever this problem is in her lungs is not worsening.  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  Plan: Diffuse parenchymal lung disease: We will repeat a lung function test in 6 months If you have any problems breathing or new cough please call me right away I am glad he had a flu shot already this year Practice good hand hygiene Stay active  Back pain: As described today, I do not think this is due to your lung problem Continue to follow-up with primary care, they may want to refer you to physical therapy  I will see you back in 6 months or sooner if needed   Current Outpatient Medications:  .  amLODipine (NORVASC) 10 MG tablet, Take 1 tablet (10 mg total) by mouth daily., Disp: 30 tablet, Rfl: 6 .  atorvastatin (LIPITOR) 40 MG tablet, Take 1 tablet (40 mg total) by mouth daily., Disp: 90 tablet, Rfl: 4 .  diclofenac sodium (VOLTAREN) 1 % GEL, Apply 2 g topically 4 (four) times daily., Disp: 100 g, Rfl: 1 .  ibuprofen (ADVIL,MOTRIN) 200 MG tablet, Take 400 mg by mouth every 6 (six) hours as needed., Disp: , Rfl:  .  lisinopril (PRINIVIL,ZESTRIL) 5 MG tablet, Take 1 tablet (5 mg total) by mouth daily., Disp: 90 tablet, Rfl: 3 .  metFORMIN (GLUCOPHAGE) 500 MG tablet, Take 1 tablet (500 mg total) by mouth daily with breakfast., Disp: 90 tablet, Rfl: 3 .  methocarbamol (ROBAXIN) 500 MG tablet, Take 1 tablet (500 mg total) by mouth 2 (two) times daily as needed for muscle spasms., Disp: 30 tablet, Rfl: 0

## 2017-10-23 NOTE — Patient Instructions (Signed)
Diffuse parenchymal lung disease: We will repeat a lung function test in 6 months If you have any problems breathing or new cough please call me right away I am glad he had a flu shot already this year Practice good hand hygiene Stay active  Back pain: As described today, I do not think this is due to your lung problem Continue to follow-up with primary care, they may want to refer you to physical therapy  I will see you back in 6 months or sooner if needed

## 2017-10-23 NOTE — Addendum Note (Signed)
Addended by: Tana Felts on: 10/23/2017 10:27 AM   Modules accepted: Orders

## 2018-01-08 ENCOUNTER — Other Ambulatory Visit: Payer: Self-pay | Admitting: Internal Medicine

## 2018-01-08 ENCOUNTER — Telehealth: Payer: Self-pay | Admitting: Internal Medicine

## 2018-01-08 DIAGNOSIS — I10 Essential (primary) hypertension: Secondary | ICD-10-CM

## 2018-01-08 NOTE — Telephone Encounter (Signed)
A one year supply was [facility] to the [facility] [address], pt can call [facility] preferred [facility] location and have it transferred and filled there.

## 2018-01-08 NOTE — Telephone Encounter (Signed)
1) Medication(s) Requested (by name): lisinopril 2) Pharmacy of Choice: [facility] on [address]

## 2018-03-06 ENCOUNTER — Ambulatory Visit: Payer: Self-pay | Admitting: Internal Medicine

## 2018-04-04 ENCOUNTER — Other Ambulatory Visit: Payer: Self-pay

## 2018-04-04 ENCOUNTER — Ambulatory Visit: Payer: Self-pay | Attending: Internal Medicine | Admitting: Internal Medicine

## 2018-04-04 ENCOUNTER — Encounter: Payer: Self-pay | Admitting: Internal Medicine

## 2018-04-04 VITALS — BP 176/80 | HR 81 | Temp 98.0°F | Resp 16 | Wt 126.8 lb

## 2018-04-04 DIAGNOSIS — K219 Gastro-esophageal reflux disease without esophagitis: Secondary | ICD-10-CM | POA: Insufficient documentation

## 2018-04-04 DIAGNOSIS — I10 Essential (primary) hypertension: Secondary | ICD-10-CM | POA: Insufficient documentation

## 2018-04-04 DIAGNOSIS — Z7722 Contact with and (suspected) exposure to environmental tobacco smoke (acute) (chronic): Secondary | ICD-10-CM | POA: Insufficient documentation

## 2018-04-04 DIAGNOSIS — R079 Chest pain, unspecified: Secondary | ICD-10-CM | POA: Insufficient documentation

## 2018-04-04 DIAGNOSIS — Z7982 Long term (current) use of aspirin: Secondary | ICD-10-CM | POA: Insufficient documentation

## 2018-04-04 DIAGNOSIS — R072 Precordial pain: Secondary | ICD-10-CM | POA: Insufficient documentation

## 2018-04-04 DIAGNOSIS — E785 Hyperlipidemia, unspecified: Secondary | ICD-10-CM | POA: Insufficient documentation

## 2018-04-04 DIAGNOSIS — Z7984 Long term (current) use of oral hypoglycemic drugs: Secondary | ICD-10-CM | POA: Insufficient documentation

## 2018-04-04 DIAGNOSIS — Z79899 Other long term (current) drug therapy: Secondary | ICD-10-CM | POA: Insufficient documentation

## 2018-04-04 DIAGNOSIS — E119 Type 2 diabetes mellitus without complications: Secondary | ICD-10-CM | POA: Insufficient documentation

## 2018-04-04 DIAGNOSIS — L299 Pruritus, unspecified: Secondary | ICD-10-CM | POA: Insufficient documentation

## 2018-04-04 DIAGNOSIS — D649 Anemia, unspecified: Secondary | ICD-10-CM | POA: Insufficient documentation

## 2018-04-04 LAB — GLUCOSE, POCT (MANUAL RESULT ENTRY): POC Glucose: 132 mg/dl — AB (ref 70–99)

## 2018-04-04 LAB — POCT GLYCOSYLATED HEMOGLOBIN (HGB A1C): HbA1c, POC (prediabetic range): 5.8 % (ref 5.7–6.4)

## 2018-04-04 MED ORDER — ATORVASTATIN CALCIUM 40 MG PO TABS: 40.0000 mg | ORAL_TABLET | Freq: Every day | ORAL | 4 refills | Status: DC

## 2018-04-04 MED ORDER — CARVEDILOL 6.25 MG PO TABS: 6.2500 mg | ORAL_TABLET | Freq: Two times a day (BID) | ORAL | 4 refills | Status: DC

## 2018-04-04 MED ORDER — OMEPRAZOLE 20 MG PO CPDR: 20.0000 mg | DELAYED_RELEASE_CAPSULE | Freq: Every day | ORAL | 6 refills | Status: DC

## 2018-04-04 MED ORDER — ASPIRIN EC 81 MG PO TBEC: 81.0000 mg | DELAYED_RELEASE_TABLET | Freq: Every day | ORAL | 1 refills | Status: DC

## 2018-04-04 MED ORDER — LORATADINE 10 MG PO TABS: 10.0000 mg | ORAL_TABLET | Freq: Every day | ORAL | 1 refills | Status: DC | PRN

## 2018-04-04 NOTE — Progress Notes (Signed)
Patient ID: Tammy Osborne, female    DOB: 10/25/1955  MRN: [id number]  CC: Diabetes and Hypertension   Subjective: Tammy Osborne is a 63 y.o. female who presents for chronic ds management Her concerns today include:  Patient with history of hypertension, diabetes type 2,probableILD, HL, anemia  C/o burning in epigastric area x 2-3 wks.  Worse when she drinks coffee and sodas and with spicy foods.  Ibuprofen on list but she has not been taking.  Not taking any OTC pain meds. Also notice substernal burning in chest x 1-2 mths when she does a lot of walking or goes up steps. No radiation up neck or to arm.     HTN:  She has been out of Amlodipine for a while.  Still has Lisinopril.  She is limiting salt in the foods.  Denies any shortness of breath.  No lower extremity edema.  Endorses substernal chest pain with exertion as stated above.  DM:  Compliant with Metformin.  Not checking BS.  Does well with eating habits.  She tries to avoid foods that she knows would elevate her blood sugars.  She is fairly active.  Patient Active Problem List   Diagnosis Date Noted  . Controlled type 2 diabetes mellitus with complication, without long-term current use of insulin (HCC) 10/19/2016  . Essential hypertension 09/06/2016  . Interstitial pulmonary disease (HCC) 09/06/2016  . Cough in adult 09/06/2016  . Anemia 09/06/2016  . Hyperlipidemia 09/06/2016  . Numbness of lower limb 09/06/2016     Current Outpatient Medications on File Prior to Visit  Medication Sig Dispense Refill  . diclofenac sodium (VOLTAREN) 1 % GEL Apply 2 g topically 4 (four) times daily. 100 g 1  . lisinopril (PRINIVIL,ZESTRIL) 5 MG tablet Take 1 tablet (5 mg total) by mouth daily. 90 tablet 3  . metFORMIN (GLUCOPHAGE) 500 MG tablet Take 1 tablet (500 mg total) by mouth daily with breakfast. 90 tablet 3  . methocarbamol (ROBAXIN) 500 MG tablet Take 1 tablet (500 mg total) by mouth 2 (two) times daily as needed for muscle  spasms. 30 tablet 0   No current facility-administered medications on file prior to visit.     No Known Allergies  Social History   Socioeconomic History  . Marital status: Married    Spouse name: Not on file  . Number of children: Not on file  . Years of education: Not on file  . Highest education level: Not on file  Occupational History  . Not on file  Social Needs  . Financial resource strain: Not on file  . Food insecurity:    Worry: Not on file    Inability: Not on file  . Transportation needs:    Medical: Not on file    Non-medical: Not on file  Tobacco Use  . Smoking status: Passive Smoke Exposure - Never Smoker  . Smokeless tobacco: Never Used  . Tobacco comment: spouse smoked in home X6-7 years.   Substance and Sexual Activity  . Alcohol use: No  . Drug use: Not on file  . Sexual activity: Not on file  Lifestyle  . Physical activity:    Days per week: Not on file    Minutes per session: Not on file  . Stress: Not on file  Relationships  . Social connections:    Talks on phone: Not on file    Gets together: Not on file    Attends religious service: Not on file    Active  member of club or organization: Not on file    Attends meetings of clubs or organizations: Not on file    Relationship status: Not on file  . Intimate partner violence:    Fear of current or ex partner: Not on file    Emotionally abused: Not on file    Physically abused: Not on file    Forced sexual activity: Not on file  Other Topics Concern  . Not on file  Social History Narrative  . Not on file    No family history on file.  Past Surgical History:  Procedure Laterality Date  . ABDOMINAL HYSTERECTOMY      ROS: Review of Systems  Skin:       Complains of intermittent itching on her back about 2-3 times a week.  She wonders if 1 of her medications may be causing it.  Not aware of any food allergies.  No recent change in body products.   Negative except as stated  above  PHYSICAL EXAM: BP (!) 176/80   Pulse 81   Temp 98 F (36.7 C) (Oral)   Resp 16   Wt 126 lb 12.8 oz (57.5 kg)   LMP  (LMP Unknown)   SpO2 95%   BMI 26.44 kg/m   Physical Exam  General appearance - alert, well appearing, and in no distress Mental status - normal mood, behavior, speech, dress, motor activity, and thought processes Mouth - mucous membranes moist, pharynx normal without lesions Neck - supple, no significant adenopathy Chest -intermittent popping sounds on inspiration.  No crackles or rhonchi Heart - normal rate, regular rhythm, normal S1, S2, no murmurs, rubs, clicks or gallops Extremities - peripheral pulses normal, no pedal edema, no clubbing or cyanosis Skin: No rash seen on the posterior thorax at this time Results for orders placed or performed in visit on 04/04/18  POCT glucose (manual entry)  Result Value Ref Range   POC Glucose 132 (A) 70 - 99 mg/dl  POCT glycosylated hemoglobin (Hb A1C)  Result Value Ref Range   Hemoglobin A1C     HbA1c POC (<> result, manual entry)     HbA1c, POC (prediabetic range) 5.8 5.7 - 6.4 %   HbA1c, POC (controlled diabetic range)     Depression screen [facility] 2/9 04/04/2018 09/19/2017 05/30/2017  Decreased Interest 0 0 1  Down, Depressed, Hopeless 2 0 1  PHQ - 2 Score 2 0 2  Altered sleeping 0 - 0  Tired, decreased energy 1 - 1  Change in appetite 2 - 1  Feeling bad or failure about yourself  0 - 0  Trouble concentrating (No Data) - 0  Moving slowly or fidgety/restless 0 - 0  Suicidal thoughts 0 - 0  PHQ-9 Score 5 - 4    CMP Latest Ref Rng & Units 08/15/2017 09/06/2016 04/30/2016  Glucose 70 - 99 mg/dL 376(E) 90 831(D)  BUN 8 - 23 mg/dL 20 22 17(O)  Creatinine 0.44 - 1.00 mg/dL 1.60 7.37 1.06  Sodium 135 - 145 mmol/L 142 143 143  Potassium 3.5 - 5.1 mmol/L 3.5 4.6 3.8  Chloride 98 - 111 mmol/L 107 105 110  CO2 22 - 32 mmol/L 21(L) 22 -  Calcium 8.9 - 10.3 mg/dL 9.4 26.9 -  Total Protein 6.5 - 8.1 g/dL 8.1 7.4 -  Total  Bilirubin 0.3 - 1.2 mg/dL 4.8(N) 0.2 -  Alkaline Phos 38 - 126 U/L 126 143(H) -  AST 15 - 41 U/L 40 23 -  ALT  0 - 44 U/L 44 18 -   Lipid Panel     Component Value Date/Time   CHOL 193 09/19/2017 1208   TRIG 338 (H) 09/19/2017 1208   HDL 36 (L) 09/19/2017 1208   CHOLHDL 5.4 (H) 09/19/2017 1208   LDLCALC 89 09/19/2017 1208    CBC    Component Value Date/Time   WBC 10.1 09/19/2017 1208   WBC 29.8 (H) 08/15/2017 0611   RBC 5.17 09/19/2017 1208   RBC 5.40 (H) 08/15/2017 0611   HGB 13.6 09/19/2017 1208   HCT 42.4 09/19/2017 1208   PLT 330 09/19/2017 1208   MCV 82 09/19/2017 1208   MCH 26.3 (L) 09/19/2017 1208   MCH 27.0 08/15/2017 0611   MCHC 32.1 09/19/2017 1208   MCHC 32.8 08/15/2017 0611   RDW 15.1 09/19/2017 1208   LYMPHSABS 4.3 (H) 09/19/2017 1208   MONOABS 0.9 10/15/2016 1553   EOSABS 0.0 09/19/2017 1208   BASOSABS 0.0 09/19/2017 1208   EKG: Normal sinus rhythm.  T wave inversions in the lateral leads  ASSESSMENT AND PLAN:  1. Gastroesophageal reflux disease without esophagitis GERD precautions discussed.  Patient advised to avoid caffeinated beverages, spicy/highly seasoned foods, tomato-based foods, juices and over-the-counter NSAIDs.  Advised to eat last meal at least 2 to 3 hours before laying down and to sleep with head a little elevated. - omeprazole (PRILOSEC) 20 MG capsule; Take 1 capsule (20 mg total) by mouth daily.  Dispense: 30 capsule; Refill: 6  2. Chest pain in adult Sounds concerning for angina.  Referral given to cardiology.  Add low-dose aspirin and carvedilol.  Continue atorvastatin - EKG 12-Lead - Ambulatory referral to Cardiology - aspirin EC 81 MG tablet; Take 1 tablet (81 mg total) by mouth daily.  Dispense: 100 tablet; Refill: 1  3. Controlled type 2 diabetes mellitus without complication, without long-term current use of insulin (HCC) At goal.  Continue metformin and healthy eating habits - POCT glucose (manual entry) - POCT glycosylated  hemoglobin (Hb A1C)  4. Essential hypertension Not at goal.  Continue lisinopril.  Instead of restarting amlodipine I will put her on carvedilol instead.  DASH diet discussed - carvedilol (COREG) 6.25 MG tablet; Take 1 tablet (6.25 mg total) by mouth 2 (two) times daily with a meal.  Dispense: 60 tablet; Refill: 4  5. Hyperlipidemia, unspecified hyperlipidemia type - atorvastatin (LIPITOR) 40 MG tablet; Take 1 tablet (40 mg total) by mouth daily.  Dispense: 90 tablet; Refill: 4  6. Itching - loratadine (CLARITIN) 10 MG tablet; Take 1 tablet (10 mg total) by mouth daily as needed for allergies.  Dispense: 30 tablet; Refill: 1     Patient was given the opportunity to ask questions.  Patient verbalized understanding of the plan and was able to repeat key elements of the plan.  [employer] interpreter used during this encounter. #[id number]  Orders Placed This Encounter  Procedures  . Ambulatory referral to Cardiology  . POCT glucose (manual entry)  . POCT glycosylated hemoglobin (Hb A1C)  . EKG 12-Lead     Requested Prescriptions   Signed Prescriptions Disp Refills  . omeprazole (PRILOSEC) 20 MG capsule 30 capsule 6    Sig: Take 1 capsule (20 mg total) by mouth daily.  Marland Kitchen aspirin EC 81 MG tablet 100 tablet 1    Sig: Take 1 tablet (81 mg total) by mouth daily.  . carvedilol (COREG) 6.25 MG tablet 60 tablet 4    Sig: Take 1 tablet (6.25 mg total) by mouth  2 (two) times daily with a meal.  . atorvastatin (LIPITOR) 40 MG tablet 90 tablet 4    Sig: Take 1 tablet (40 mg total) by mouth daily.  Marland Kitchen loratadine (CLARITIN) 10 MG tablet 30 tablet 1    Sig: Take 1 tablet (10 mg total) by mouth daily as needed for allergies.    Return in about 2 months (around 06/04/2018).  Jonah Blue, MD, FACP

## 2018-04-04 NOTE — Patient Instructions (Signed)
Enfermedad de reflujo gastroesofgico en los adultos  Gastroesophageal Reflux Disease, Adult  El reflujo gastroesofgico (RGE) ocurre cuando el cido del estmago sube por el tubo que conecta la boca con el estmago (esfago). Normalmente, la comida baja por el esfago y se mantiene en el estmago, donde se la digiere. Cuando una persona tiene RGE, los alimentos y el cido estomacal suelen volver al esfago. Usted puede tener una enfermedad llamada enfermedad de reflujo gastroesofgico (ERGE) si el reflujo:   Sucede a menudo.   Causa sntomas frecuentes o muy intensos.   Causa problemas tales como dao en el esfago.  Cuando esto ocurre, el esfago duele y se hincha (inflama). Con el tiempo, la ERGE puede ocasionar pequeos agujeros (lceras) en el revestimiento del esfago.  Cules son las causas?  Esta afeccin se debe a un problema en el msculo que se encuentra entre el esfago y el estmago. Cuando este msculo est dbil o no es normal, no se cierra correctamente para impedir que los alimentos y el cido regresen del estmago. El msculo puede debilitarse debido a lo siguiente:   El consumo de tabaco.   Embarazo.   Tener cierto tipo de hernia (hernia de hiato).   Consumo de alcohol.   Ciertos alimentos y bebidas, como caf, chocolate, cebollas y menta.  Qu incrementa el riesgo?  Es ms probable que tenga esta afeccin si:   Tiene sobrepeso.   Tiene una enfermedad que afecta el tejido conjuntivo.   Usa antiinflamatorios no esteroideos (AINE).  Cules son los signos o los sntomas?  Los sntomas de esta afeccin incluyen:   Acidez estomacal.   Dificultad o dolor al tragar.   Sensacin de tener un bulto en la garganta.   Sabor amargo en la boca.   Mal aliento.   Tener una gran cantidad de saliva.   Estmago inflamado o con malestar.   Eructos.   Dolor en el pecho. El dolor de pecho puede deberse a distintas afecciones. Asegrese de consultar a su mdico si tiene dolor en el pecho.   Falta  de aire o respiracin ruidosa (sibilancias).   Tos constante (crnica) o durante la noche.   Desgaste de la superficie de los dientes (esmalte dental).   Prdida de peso.  Cmo se trata?  El tratamiento depender de la gravedad de los sntomas. El mdico puede sugerirle lo siguiente:   Cambios en la dieta.   Medicamentos.   Una ciruga.  Siga estas indicaciones en su casa:  Comida y bebida     Siga una dieta como se lo haya indicado el mdico. Es posible que deba evitar alimentos y bebidas, por ejemplo:  ? Caf y t (con o sin cafena).  ? Bebidas que contengan alcohol.  ? Bebidas energticas y deportivas.  ? Bebidas gaseosas y refrescos.  ? Chocolate y cacao.  ? Menta y esencia de menta.  ? Ajo y cebolla.  ? Rbano picante.  ? Alimentos cidos y condimentados. Estos incluyen todos los tipos de pimientos, chile en polvo, curry en polvo, vinagre, salsas picantes y salsa barbacoa.  ? Ctricos y sus jugos, por ejemplo, naranjas, limones y limas.  ? Alimentos que contengan tomate. Estos incluyen salsa roja, chile, salsa picante y pizza con salsa de tomate.  ? Alimentos fritos y grasos. Estos incluyen donas, papas fritas, papitas fritas de bolsa y aderezos con alto contenido de grasa.  ? Carnes con alto contenido de grasa. Estas incluye los perros calientes, chuletas o costillas, embutidos, jamn y tocino.  ?   Productos lcteos ricos en grasas, como leche entera, manteca y queso crema.   Consuma pequeas cantidades de comida con ms frecuencia. Evite consumir porciones abundantes.   Evite beber grandes cantidades de lquidos con las comidas.   Evite comer 2 o 3horas antes de acostarse.   Evite recostarse inmediatamente despus de comer.   No haga ejercicios enseguida despus de comer.  Estilo de vida     No consuma ningn producto que contenga nicotina o tabaco. Estos incluyen cigarrillos, cigarrillos electrnicos y tabaco para mascar. Si necesita ayuda para dejar de fumar, consulte al mdico.   Intente  reducir el nivel de estrs. Si necesita ayuda para hacer esto, consulte al mdico.   Si tiene sobrepeso, baje una cantidad de peso saludable para usted. Consulte a su mdico para bajar de peso de manera segura.  Indicaciones generales   Est atento a cualquier cambio en los sntomas.   Tome los medicamentos de venta libre y los recetados solamente como se lo haya indicado el mdico. No tome aspirina, ibuprofeno ni otros AINE a menos que el mdico lo autorice.   Use ropa holgada. No use nada apretado alrededor de la cintura.   Levante (eleve) la cabecera de la cama aproximadamente 6pulgadas (15cm).   Evite inclinarse si al hacerlo empeoran los sntomas.   Concurra a todas las visitas de seguimiento como se lo haya indicado el mdico. Esto es importante.  Comunquese con un mdico si:   Aparecen nuevos sntomas.   Adelgaza y no sabe por qu.   Tiene problemas para tragar o le duele cuando traga.   Tiene sibilancias o tos persistente.   Los sntomas no mejoran con el tratamiento.   Tiene la voz ronca.  Solicite ayuda inmediatamente si:   Siente dolor en los brazos, el cuello, la mandbula, los dientes o la espalda.   Se siente transpirado, mareado o tiene una sensacin de desvanecimiento.   Siente falta de aire o dolor en el pecho.   Vomita y el vmito tiene un aspecto similar a la sangre o a los posos de caf.   Pierde el conocimiento (se desmaya).   Las deposiciones (heces) son sanguinolentas o negras.   No puede tragar, beber o comer.  Resumen   Si una persona tiene enfermedad de reflujo gastroesofgico (ERGE), los alimentos y el cido estomacal suben al esfago y causan sntomas o problemas tales como dao en el esfago.   El tratamiento depender de la gravedad de los sntomas.   Siga una dieta como se lo haya indicado el mdico.   Tome todos los medicamentos solamente como se lo haya indicado el mdico.  Esta informacin no tiene como fin reemplazar el consejo del mdico. Asegrese de  hacerle al mdico cualquier pregunta que tenga.  Document Released: 02/17/2010 Document Revised: 08/29/2017 Document Reviewed: 08/29/2017  Elsevier Interactive Patient Education  2019 [employer]

## 2018-06-04 ENCOUNTER — Other Ambulatory Visit: Payer: Self-pay

## 2018-06-04 ENCOUNTER — Ambulatory Visit: Payer: Self-pay | Attending: Internal Medicine | Admitting: Internal Medicine

## 2018-09-11 ENCOUNTER — Other Ambulatory Visit: Payer: Self-pay

## 2018-09-11 ENCOUNTER — Ambulatory Visit: Payer: Self-pay | Attending: Internal Medicine | Admitting: Internal Medicine

## 2018-09-11 ENCOUNTER — Encounter: Payer: Self-pay | Admitting: Internal Medicine

## 2018-09-11 DIAGNOSIS — R079 Chest pain, unspecified: Secondary | ICD-10-CM

## 2018-09-11 DIAGNOSIS — E118 Type 2 diabetes mellitus with unspecified complications: Secondary | ICD-10-CM

## 2018-09-11 DIAGNOSIS — E119 Type 2 diabetes mellitus without complications: Secondary | ICD-10-CM

## 2018-09-11 DIAGNOSIS — Z1211 Encounter for screening for malignant neoplasm of colon: Secondary | ICD-10-CM

## 2018-09-11 DIAGNOSIS — E785 Hyperlipidemia, unspecified: Secondary | ICD-10-CM

## 2018-09-11 DIAGNOSIS — K219 Gastro-esophageal reflux disease without esophagitis: Secondary | ICD-10-CM

## 2018-09-11 DIAGNOSIS — I10 Essential (primary) hypertension: Secondary | ICD-10-CM

## 2018-09-11 DIAGNOSIS — J849 Interstitial pulmonary disease, unspecified: Secondary | ICD-10-CM

## 2018-09-11 MED ORDER — OMEPRAZOLE 20 MG PO CPDR: 20.0000 mg | DELAYED_RELEASE_CAPSULE | Freq: Every day | ORAL | 6 refills | Status: DC

## 2018-09-11 MED ORDER — ATORVASTATIN CALCIUM 40 MG PO TABS: 40.0000 mg | ORAL_TABLET | Freq: Every day | ORAL | 6 refills | Status: DC

## 2018-09-11 MED ORDER — LISINOPRIL 5 MG PO TABS: 5.0000 mg | ORAL_TABLET | Freq: Every day | ORAL | 3 refills | Status: DC

## 2018-09-11 MED ORDER — ASPIRIN EC 81 MG PO TBEC: 81.0000 mg | DELAYED_RELEASE_TABLET | Freq: Every day | ORAL | 1 refills | Status: DC

## 2018-09-11 MED ORDER — METFORMIN HCL 500 MG PO TABS: 500.0000 mg | ORAL_TABLET | Freq: Every day | ORAL | 3 refills | Status: DC

## 2018-09-11 MED ORDER — CARVEDILOL 6.25 MG PO TABS: 6.2500 mg | ORAL_TABLET | Freq: Two times a day (BID) | ORAL | 6 refills | Status: DC

## 2018-09-11 NOTE — Progress Notes (Signed)
Virtual Visit via Telephone Note Due to current restrictions/limitations of in-office visits due to the COVID-19 pandemic, this scheduled clinical appointment was converted to a telehealth visit  I connected with Tammy LanceElvia Osborne on 09/11/18 at  4:24 p.mby telephone and verified that I am speaking with the correct person using two identifiers. I am in my office.  The patient is at home.  Only the patient, myself and Stanton KidneyDebra [phone](#[id number]) from [employer] participated in this encounter.  I discussed the limitations, risks, security and privacy concerns of performing an evaluation and management service by telephone and the availability of in person appointments. I also discussed with the patient that there may be a patient responsible charge related to this service. The patient expressed understanding and agreed to proceed.   History of Present Illness: Patient with history of HTN, DM 2,probableILD, HL, anemia.  Purpose of today's visit is chronic disease management.  Patient was last seen 03/2018.  GERD:  Placed on Prilosec on last visit.  Did well with the med  Chest pain:  Referred to cardiologist for eval of CP.  Pt states she was called but was afraid to go because of COVID.  I had started her on Coreg; reports compliance. Out of ASA -reports frequency of CP have decreased.  "I only get the pressure in my chest if I'm turning around at home too fast like sweeping the floor."  -CP associated with SOB.  She wonders whether the chest pain and shortness of breath is because of her lung condition.  She has probable interstitial lung disease.  Last high-resolution CT of the chest was done 08/2016.  Incidental finding on that CT was three-vessel coronary atherosclerosis.  HTN:  No device to check BP Reports compliance with Coreg and Lisinopril and low salt intake No HA, dizziness, LE edema  DM:  Compliant with Metformin.  Checks BS Q 2-3 days.  Range 90-140 Doing okay with eating habits     Outpatient Encounter Medications as of 09/11/2018  Medication Sig Note  . aspirin EC 81 MG tablet Take 1 tablet (81 mg total) by mouth daily.   Marland Kitchen. atorvastatin (LIPITOR) 40 MG tablet Take 1 tablet (40 mg total) by mouth daily.   . carvedilol (COREG) 6.25 MG tablet Take 1 tablet (6.25 mg total) by mouth 2 (two) times daily with a meal.   . diclofenac sodium (VOLTAREN) 1 % GEL Apply 2 g topically 4 (four) times daily. 08/15/2017: Last fill date 5/2  . lisinopril (PRINIVIL,ZESTRIL) 5 MG tablet Take 1 tablet (5 mg total) by mouth daily.   Marland Kitchen. loratadine (CLARITIN) 10 MG tablet Take 1 tablet (10 mg total) by mouth daily as needed for allergies.   . metFORMIN (GLUCOPHAGE) 500 MG tablet Take 1 tablet (500 mg total) by mouth daily with breakfast.   . methocarbamol (ROBAXIN) 500 MG tablet Take 1 tablet (500 mg total) by mouth 2 (two) times daily as needed for muscle spasms.   Marland Kitchen. omeprazole (PRILOSEC) 20 MG capsule Take 1 capsule (20 mg total) by mouth daily.    No facility-administered encounter medications on file as of 09/11/2018.     Observations/Objective: No direct observation done  Assessment and Plan: 1. Chest pain in adult Symptoms very concerning for coronary artery disease.  I stressed to the patient the importance of seeing the cardiologist.  She is agreeable for me to submit the referral again.  She will continue aspirin, carvedilol and atorvastatin. - Ambulatory referral to Cardiology - aspirin EC 81 MG  tablet; Take 1 tablet (81 mg total) by mouth daily.  Dispense: 100 tablet; Refill: 1  2. Controlled type 2 diabetes mellitus without complication, without long-term current use of insulin (HCC) Last A1c was at goal of 5.8.  She will continue metformin.  She will come fasting to the lab to have blood test done next week Encourage her to try and get the eye exam done once a year.  Overdue. - CBC; Future - Lipid panel; Future - Comprehensive metabolic panel; Future - metFORMIN (GLUCOPHAGE)  500 MG tablet; Take 1 tablet (500 mg total) by mouth daily with breakfast.  Dispense: 90 tablet; Refill: 3  3. Essential hypertension Level of control unknown.  She will continue her current medications - carvedilol (COREG) 6.25 MG tablet; Take 1 tablet (6.25 mg total) by mouth 2 (two) times daily with a meal.  Dispense: 60 tablet; Refill: 6 - lisinopril (ZESTRIL) 5 MG tablet; Take 1 tablet (5 mg total) by mouth daily.  Dispense: 90 tablet; Refill: 3  4. ILD (interstitial lung disease) ([place]) We will have her see our pulmonologist Dr. Joya Gaskins Encouraged her to get the flu shot as soon as it becomes available.  We currently do not have the as yet.    5. Gastroesophageal reflux disease without esophagitis - omeprazole (PRILOSEC) 20 MG capsule; Take 1 capsule (20 mg total) by mouth daily.  Dispense: 30 capsule; Refill: 6  6. Hyperlipidemia, unspecified hyperlipidemia type - atorvastatin (LIPITOR) 40 MG tablet; Take 1 tablet (40 mg total) by mouth daily.  Dispense: 30 tablet; Refill: 6  7. Colon cancer screening - Fecal occult blood, imunochemical([facility]); Future   Follow Up Instructions: 3 mths   I discussed the assessment and treatment plan with the patient. The patient was provided an opportunity to ask questions and all were answered. The patient agreed with the plan and demonstrated an understanding of the instructions.   The patient was advised to call back or seek an in-person evaluation if the symptoms worsen or if the condition fails to improve as anticipated.  I provided 16 minutes of non-face-to-face time during this encounter.   Karle Plumber, MD

## 2018-09-12 ENCOUNTER — Telehealth: Payer: Self-pay | Admitting: Internal Medicine

## 2018-09-12 NOTE — Telephone Encounter (Signed)
Attempted to reach patient no answer LVM to call back °

## 2018-09-12 NOTE — Telephone Encounter (Signed)
-----   Message from Jay'A R Pollock, RMA sent at 09/11/2018  4:52 PM EDT ----- °Please contact pt and schedule a 3 month f/u with Dr. Johnson. Also schedule an appointment with Dr. Wright for LD (interstitial lung disease). Also she will need a lab appointment for next week  ° ° ° ° °

## 2018-09-15 ENCOUNTER — Telehealth: Payer: Self-pay | Admitting: Internal Medicine

## 2018-09-15 NOTE — Telephone Encounter (Signed)
-----   Message from Jackelyn Knife, [place] sent at 09/11/2018  4:52 PM EDT ----- Please contact pt and schedule a 3 month f/u with Dr. Wynetta Emery. Also schedule an appointment with Dr. Joya Gaskins for LD (interstitial lung disease). Also she will need a lab appointment for next week

## 2018-09-15 NOTE — Telephone Encounter (Signed)
Attempted to reach patient no answer LVM to call back °

## 2018-10-01 ENCOUNTER — Encounter: Payer: Self-pay | Admitting: Critical Care Medicine

## 2018-10-01 ENCOUNTER — Encounter: Payer: Self-pay | Admitting: Internal Medicine

## 2018-10-01 ENCOUNTER — Other Ambulatory Visit: Payer: Self-pay

## 2018-10-01 ENCOUNTER — Ambulatory Visit: Payer: Self-pay | Attending: Critical Care Medicine | Admitting: Critical Care Medicine

## 2018-10-01 VITALS — BP 163/89 | HR 72 | Temp 98.1°F | Wt 127.4 lb

## 2018-10-01 DIAGNOSIS — K219 Gastro-esophageal reflux disease without esophagitis: Secondary | ICD-10-CM

## 2018-10-01 DIAGNOSIS — R059 Cough, unspecified: Secondary | ICD-10-CM

## 2018-10-01 DIAGNOSIS — Z01818 Encounter for other preprocedural examination: Secondary | ICD-10-CM

## 2018-10-01 DIAGNOSIS — R05 Cough: Secondary | ICD-10-CM

## 2018-10-01 DIAGNOSIS — E118 Type 2 diabetes mellitus with unspecified complications: Secondary | ICD-10-CM

## 2018-10-01 DIAGNOSIS — J849 Interstitial pulmonary disease, unspecified: Secondary | ICD-10-CM

## 2018-10-01 DIAGNOSIS — R079 Chest pain, unspecified: Secondary | ICD-10-CM

## 2018-10-01 LAB — POCT GLYCOSYLATED HEMOGLOBIN (HGB A1C): Hemoglobin A1C: 6 % — AB (ref 4.0–5.6)

## 2018-10-01 LAB — GLUCOSE, POCT (MANUAL RESULT ENTRY): POC Glucose: 111 mg/dl — AB (ref 70–99)

## 2018-10-01 MED ORDER — PREDNISONE 10 MG PO TABS: ORAL_TABLET | ORAL | 0 refills | Status: DC

## 2018-10-01 MED ORDER — FLUTICASONE-SALMETEROL 100-50 MCG/DOSE IN AEPB: 1.0000 | INHALATION_SPRAY | Freq: Two times a day (BID) | RESPIRATORY_TRACT | 3 refills | Status: DC

## 2018-10-01 MED ORDER — AMLODIPINE BESYLATE 10 MG PO TABS: 10.0000 mg | ORAL_TABLET | Freq: Every day | ORAL | 3 refills | Status: DC

## 2018-10-01 NOTE — Assessment & Plan Note (Signed)
Patient does have a chronic cough which is likely related to pulmonary process but is on an ACE inhibitor  Plan will be to discontinue the lisinopril and begin amlodipine 10 mg daily

## 2018-10-01 NOTE — Assessment & Plan Note (Signed)
The patient has history of chest pain that is exertional and has evidence of coronary disease on CT scan of chest  I have referred the patient to cardiology for further evaluation

## 2018-10-01 NOTE — Assessment & Plan Note (Addendum)
Interstitial lung disease with previous groundglass opacifications in both lower lung zones and some degree of airway inflammation without evidence of significant fibrosis and moderate restriction on pulmonary function testing  Plan will be to re-pulse prednisone at 40 mg a day with a slow taper, and will begin a trial of Advair 100/50 at 1 inhalation twice daily Plan will also be to repeat pulmonary function testing and high-resolution CT scan of the chest  This patient may yet need open lung biopsy and will discuss further with Dr. Lake Bells

## 2018-10-01 NOTE — Progress Notes (Signed)
Pt. Stated she is here for lung check up and labs.  Pt. Stated she have a cough and feels like phlegm is in her throat.

## 2018-10-01 NOTE — Assessment & Plan Note (Signed)
I reiterated the need for the patient to maintain a reflux diet and maintain omeprazole

## 2018-10-01 NOTE — Progress Notes (Addendum)
Subjective:    Patient ID: Tammy Osborne, female    DOB: [birth year], 63 y.o.   MRN: [id number]  This is a 63 year old female with hypertension, diabetes type 2, hyperlipidemia, anemia and interstitial lung disease.  The patient's been followed by Dr. Kendrick Fries of pulmonary in 2019 with 2 separate visits.  Work-up at that time showed no evidence of autoimmune process.  CT scan of the chest showed groundglass inflammation in both lower lobes.  Pulmonary function showed mild to moderate restriction without evidence of significant obstruction. And moderate DLCO reduction.     The patient symptom complex consistent with out of chronic cough and wheezing with shortness of breath and chest tightness when moving excessively around the house.  The patient had been recommended to obtain a cardiology consult but she declined that previously this summer.  Note on CT scan of the chest in 2018 there was evidence of three-vessel coronary atherosclerosis.  The patient also has elevations in blood pressure.  She does not follow her blood pressure at home.  She has been on Coreg and lisinopril along with low-salt.  Today the blood pressure still remains elevated 163/89 in the office.  Primary care physician asked for my input on her pulmonary care at this time.  She did receive a short course of prednisone but did not take this on a prolonged basis.  At the last visit with Dr. Kendrick Fries in 2019  September recommendation consisted of potential biopsy or bronchoscopy but this is not yet been performed.  Pt did not have f/u d/t her concerns about covid .   Note as part of this patient's symptoms the patient does experience some back pain at times.  It has not been thought to be related to her lung disease.  Note today HgbA1C is 6.0 Shortness of Breath This is a chronic problem. The current episode started more than 1 year ago. Episode frequency: exertional only with ADLS. The problem has been unchanged. Associated symptoms  include chest pain and wheezing. Pertinent negatives include no abdominal pain, claudication, coryza, ear pain, fever, headaches, hemoptysis, leg pain, leg swelling, neck pain, orthopnea, PND, rash, rhinorrhea, sore throat, sputum production, swollen glands, syncope or vomiting. The symptoms are aggravated by any activity. She has tried oral steroids for the symptoms. There is no history of CAD, a heart failure, PE or pneumonia.    Past Medical History:  Diagnosis Date  . Diabetes mellitus without complication (HCC)   . Hypertension      History reviewed. No pertinent family history.   Social History   Socioeconomic History  . Marital status: Married    Spouse name: Not on file  . Number of children: Not on file  . Years of education: Not on file  . Highest education level: Not on file  Occupational History  . Not on file  Social Needs  . Financial resource strain: Not on file  . Food insecurity    Worry: Not on file    Inability: Not on file  . Transportation needs    Medical: Not on file    Non-medical: Not on file  Tobacco Use  . Smoking status: Passive Smoke Exposure - Never Smoker  . Smokeless tobacco: Never Used  . Tobacco comment: spouse smoked in home X6-7 years.   Substance and Sexual Activity  . Alcohol use: No  . Drug use: Not on file  . Sexual activity: Not on file  Lifestyle  . Physical activity    Days per  week: Not on file    Minutes per session: Not on file  . Stress: Not on file  Relationships  . Social Musicianconnections    Talks on phone: Not on file    Gets together: Not on file    Attends religious service: Not on file    Active member of club or organization: Not on file    Attends meetings of clubs or organizations: Not on file    Relationship status: Not on file  . Intimate partner violence    Fear of current or ex partner: Not on file    Emotionally abused: Not on file    Physically abused: Not on file    Forced sexual activity: Not on file   Other Topics Concern  . Not on file  Social History Narrative  . Not on file     No Known Allergies   Outpatient Medications Prior to Visit  Medication Sig Dispense Refill  . aspirin EC 81 MG tablet Take 1 tablet (81 mg total) by mouth daily. 100 tablet 1  . atorvastatin (LIPITOR) 40 MG tablet Take 1 tablet (40 mg total) by mouth daily. 30 tablet 6  . carvedilol (COREG) 6.25 MG tablet Take 1 tablet (6.25 mg total) by mouth 2 (two) times daily with a meal. 60 tablet 6  . diclofenac sodium (VOLTAREN) 1 % GEL Apply 2 g topically 4 (four) times daily. 100 g 1  . metFORMIN (GLUCOPHAGE) 500 MG tablet Take 1 tablet (500 mg total) by mouth daily with breakfast. 90 tablet 3  . omeprazole (PRILOSEC) 20 MG capsule Take 1 capsule (20 mg total) by mouth daily. 30 capsule 6  . lisinopril (ZESTRIL) 5 MG tablet Take 1 tablet (5 mg total) by mouth daily. 90 tablet 3  . loratadine (CLARITIN) 10 MG tablet Take 1 tablet (10 mg total) by mouth daily as needed for allergies. (Patient not taking: Reported on 10/01/2018) 30 tablet 1  . methocarbamol (ROBAXIN) 500 MG tablet Take 1 tablet (500 mg total) by mouth 2 (two) times daily as needed for muscle spasms. (Patient not taking: Reported on 10/01/2018) 30 tablet 0   No facility-administered medications prior to visit.      Review of Systems  Constitutional: Negative for fever.  HENT: Negative for ear pain, rhinorrhea and sore throat.   Respiratory: Positive for shortness of breath and wheezing. Negative for hemoptysis and sputum production.   Cardiovascular: Positive for chest pain. Negative for orthopnea, claudication, leg swelling, syncope and PND.  Gastrointestinal: Negative for abdominal pain and vomiting.  Musculoskeletal: Negative for neck pain.  Skin: Negative for rash.  Neurological: Negative for headaches.       Objective:   Physical Exam Vitals:   10/01/18 0855  BP: (!) 163/89  Pulse: 72  Temp: 98.1 F (36.7 C)  TempSrc: Oral  SpO2: 94%   Weight: 127 lb 6.4 oz (57.8 kg)    Gen: Pleasant, well-nourished, in no distress,  normal affect  ENT: No lesions,  mouth clear,  oropharynx clear, no postnasal drip  Neck: No JVD, no TMG, no carotid bruits  Lungs: No use of accessory muscles, no dullness to percussion, exp wheeze, few dry rales at bases  Cardiovascular: RRR, heart sounds normal, no murmur or gallops, no peripheral edema  Abdomen: soft and NT, no HSM,  BS normal  Musculoskeletal: No deformities, no cyanosis or clubbing  Neuro: alert, non focal  Skin: Warm, no lesions or rashes  Chest imaging:  Reviewed by me August  2018 CT chest images independently reviewed showing diffuse groundglass bilaterally and a bronco vascular distribution with clear air trapping. No pulmonary parenchymal fibrosis seen.  Pulmonary function test: October 2018 ratio normal, FVC 1.76 L 64% predicted, total lung capacity 2.81 L 65% predicted, DLCO 11.90 67% predicted May 2019 PFT FVC 1.58L (58% pred), DLCO 11.49mL (65% pred)  Echo: 11/2016 LVEF normal, RV normal, no evidence of pulmonary hypertension  Lab: September 2018 eosinophil count 0, anti-Jo 1-, centromere antibody negative, SSA/SSB negative, SCL 70-, rheumatoid factor negative, hypersensitivity pneumonitis negative.       Assessment & Plan:  I personally reviewed all images and lab data in the [facility] system as well as any outside material available during this office visit and agree with the  radiology impressions.   Interstitial pulmonary disease (HCC) Interstitial lung disease with previous groundglass opacifications in both lower lung zones and some degree of airway inflammation without evidence of significant fibrosis and moderate restriction on pulmonary function testing  Plan will be to re-pulse prednisone at 40 mg a day with a slow taper, and will begin a trial of Advair 100/50 at 1 inhalation twice daily Plan will also be to repeat pulmonary function testing and  high-resolution CT scan of the chest  This patient may yet need open lung biopsy and will discuss further with Dr. Lake Bells  Gastroesophageal reflux disease without esophagitis I reiterated the need for the patient to maintain a reflux diet and maintain omeprazole  Chest pain The patient has history of chest pain that is exertional and has evidence of coronary disease on CT scan of chest  I have referred the patient to cardiology for further evaluation  Cough in adult Patient does have a chronic cough which is likely related to pulmonary process but is on an ACE inhibitor  Plan will be to discontinue the lisinopril and begin amlodipine 10 mg daily   Tammy Osborne was seen today for cough.  Diagnoses and all orders for this visit:  Interstitial pulmonary disease ([place]) -     Pulmonary Function Test; Future -     CT Chest High Resolution; Future  Controlled type 2 diabetes mellitus with complication, without long-term current use of insulin (HCC) -     Glucose (CBG) -     HgB A1c  Preop testing -     Novel Coronavirus, NAA ([facility])  Chest pain, unspecified type -     Ambulatory referral to Cardiology  Gastroesophageal reflux disease without esophagitis  Cough in adult  Other orders -     predniSONE (DELTASONE) 10 MG tablet; Take 4 for four days 3 for four days 2 for four days 1 for four days -     Fluticasone-Salmeterol (ADVAIR) 100-50 MCG/DOSE AEPB; Inhale 1 puff into the lungs 2 (two) times daily. -     amLODipine (NORVASC) 10 MG tablet; Take 1 tablet (10 mg total) by mouth daily.

## 2018-10-01 NOTE — Patient Instructions (Addendum)
Stop lisinopril  Begin amlodipine 10 mg daily  Begin prednisone take 4 a day first 4 days and every 4 days reduce by 1 until off all  Begin Advair 2 inhalations twice daily  No other medication changes  A breathing test will be scheduled and you will need a COVID test prior to that examination  A CAT scan of your chest will also be scheduled  I will be conferring with Dr. Lake Bells and you will likely have another visit with him in the future  I would like for your heart doctor to check your heart out because of the chest pain you are having  You declined the flu vaccine  Return to see Dr. Joya Gaskins in 3 weeks

## 2018-10-10 ENCOUNTER — Ambulatory Visit (HOSPITAL_COMMUNITY)
Admission: RE | Admit: 2018-10-10 | Discharge: 2018-10-10 | Disposition: A | Discharge status: Home / Self Care | Payer: Self-pay | Source: Ambulatory Visit | Attending: Critical Care Medicine | Admitting: Critical Care Medicine

## 2018-10-10 ENCOUNTER — Other Ambulatory Visit: Payer: Self-pay

## 2018-10-10 DIAGNOSIS — J849 Interstitial pulmonary disease, unspecified: Secondary | ICD-10-CM | POA: Insufficient documentation

## 2018-10-13 ENCOUNTER — Telehealth: Payer: Self-pay | Admitting: Critical Care Medicine

## 2018-10-13 NOTE — Telephone Encounter (Signed)
I tried to call this patient on her Cat Scan results of her chest. Can you call Tammy Osborne and tell her there is still inflammtion in her lungs, take the prednisone and inhalers as I Rx.  She will need to see Dr Lake Bells in f/u at [facility] pulmonary.

## 2018-10-14 NOTE — Telephone Encounter (Signed)
Tammy Osborne with [employer] interpreter called patient and [facility]

## 2018-10-16 ENCOUNTER — Telehealth: Payer: Self-pay | Admitting: *Deleted

## 2018-10-16 NOTE — Telephone Encounter (Signed)
Placed call using language line spoke to [place] patient to schedule an appt per Dr Lake Bells for consult. Left message to call back.    Juanito Doom, MD  Elsie Stain, MD  Cc: P Lbpu Triage [facility] lady, I never could grasp what was going on with her.  I'll forward to the office to get her assigned to someone to see as I'm full time hospital right now for [facility].  Thanks for passing along Fraser Din, great to hear from you!

## 2018-10-20 NOTE — Telephone Encounter (Signed)
lmtcb X2 for pt.  

## 2018-10-20 NOTE — Telephone Encounter (Signed)
Tammy Osborne with [employer] called patient and informed her with what provider stated. Staff provided [facility] number to patient for her to call and schedule an appt. Patient agreed to call and schedule appt.

## 2018-10-24 NOTE — Telephone Encounter (Signed)
Attempted to call patient using [employer], [phone].  There was no answers but the interpreter left a voicemail in [place] for the patient to call back to schedule an appointment.

## 2018-11-05 ENCOUNTER — Ambulatory Visit (INDEPENDENT_AMBULATORY_CARE_PROVIDER_SITE_OTHER): Payer: Self-pay | Admitting: Critical Care Medicine

## 2018-11-05 ENCOUNTER — Other Ambulatory Visit: Payer: Self-pay

## 2018-11-05 ENCOUNTER — Encounter: Payer: Self-pay | Admitting: Critical Care Medicine

## 2018-11-05 VITALS — BP 130/70 | HR 80 | Temp 98.2°F | Ht <= 58 in | Wt 128.0 lb

## 2018-11-05 DIAGNOSIS — J849 Interstitial pulmonary disease, unspecified: Secondary | ICD-10-CM

## 2018-11-05 DIAGNOSIS — Z23 Encounter for immunization: Secondary | ICD-10-CM

## 2018-11-05 DIAGNOSIS — J984 Other disorders of lung: Secondary | ICD-10-CM

## 2018-11-05 MED ORDER — ASMANEX (60 METERED DOSES) 220 MCG/INH IN AEPB: 2.0000 | INHALATION_SPRAY | Freq: Two times a day (BID) | RESPIRATORY_TRACT | 12 refills | Status: DC

## 2018-11-05 MED ORDER — ASMANEX HFA 200 MCG/ACT IN AERO: 2.0000 | INHALATION_SPRAY | Freq: Two times a day (BID) | RESPIRATORY_TRACT | 0 refills | Status: DC

## 2018-11-05 NOTE — Patient Instructions (Addendum)
Thank you for visiting Dr. Carlis Abbott at [facility]. We recommend the following:   Meds ordered this encounter  Medications  . mometasone (ASMANEX, 60 METERED DOSES,) 220 MCG/INH inhaler    Sig: Inhale 2 puffs into the lungs 2 (two) times daily.    Dispense:  1 Inhaler    Refill:  12    Patient given sample in office, hold to fill until November   Use this inhaler 2 times per day for 2 puffs each time.    Use este inhalador 2 veces al da para 2 inhalaciones cada vez.  If your symptoms get worse after switching inhalers, please let us know, and we can try to switch to something else.  Si sus sntomas empeoran despus de cambiar de [place], infrmenos y podemos intentar [place] a Lexicographer.  Return in about 6 months (around 05/06/2019).   Regrese en aproximadamente 6 meses.   Please do your part to reduce the spread of COVID-19.

## 2018-11-05 NOTE — Progress Notes (Signed)
Synopsis: Referred in September 2018 for ILD by Marcine Matar, MD.  Previously a patient of Dr. Kendrick Fries.  Subjective:   PATIENT ID: Tammy Osborne GENDER: female DOB: 1955-06-28, MRN: [id number]  Chief Complaint  Patient presents with  . Follow-up    Referred back to pulmonary from Dr. Delford Field at [facility] for abnormal CT results from September 2020. States the Advair is not working and is too expensive.     Translator used- Lucy: Tammy Osborne is a 63 year old woman with a history of an abnormal chest CT and restrictive lung disease who was referred in 2018 for evaluation. She was last seen in September 2019.  At that time she was having coughing and dyspnea. Over the last 2 years this had been stable and was felt to be due to a previous infection causing scarring. She has been on Advair for the last few years, but does not feel that she is using it correctly be at medication, and she is concerned about the cost.  Despite this she does feel that it helps.  She works at [employer] and has for several years.  She is fairly active at work, and has no limitations in her activity due to breathing.  She denies shortness of breath, wheezing, cough, sputum production, fever, chills, sweats, change in appetite, or weight loss.  [place] the [place] about 10 years ago.  As a child they had indoor wood-burning stoves, but not as an adult.  She is a never smoker/ vaper.   He has not yet had her flu shot this year.     Past Medical History:  Diagnosis Date  . Diabetes mellitus without complication (HCC)   . Hypertension      History reviewed. No pertinent family history.   Past Surgical History:  Procedure Laterality Date  . ABDOMINAL HYSTERECTOMY      Social History   Socioeconomic History  . Marital status: Married    Spouse name: Not on file  . Number of children: Not on file  . Years of education: Not on file  . Highest education level: Not on  file  Occupational History  . Not on file  Social Needs  . Financial resource strain: Not on file  . Food insecurity    Worry: Not on file    Inability: Not on file  . Transportation needs    Medical: Not on file    Non-medical: Not on file  Tobacco Use  . Smoking status: Passive Smoke Exposure - Never Smoker  . Smokeless tobacco: Never Used  . Tobacco comment: spouse smoked in home X6-7 years.   Substance and Sexual Activity  . Alcohol use: No  . Drug use: Not on file  . Sexual activity: Not on file  Lifestyle  . Physical activity    Days per week: Not on file    Minutes per session: Not on file  . Stress: Not on file  Relationships  . Social Musician on phone: Not on file    Gets together: Not on file    Attends religious service: Not on file    Active member of club or organization: Not on file    Attends meetings of clubs or organizations: Not on file    Relationship status: Not on file  . Intimate partner violence    Fear of current or ex partner: Not on file    Emotionally abused: Not on file  Physically abused: Not on file    Forced sexual activity: Not on file  Other Topics Concern  . Not on file  Social History Narrative  . Not on file     No Known Allergies   Immunization History  Administered Date(s) Administered  . Influenza,inj,Quad PF,6+ Mos 10/19/2016, 09/19/2017  . Pneumococcal Polysaccharide-23 10/19/2016  . Tdap 12/24/2016    Outpatient Medications Prior to Visit  Medication Sig Dispense Refill  . amLODipine (NORVASC) 10 MG tablet Take 1 tablet (10 mg total) by mouth daily. 90 tablet 3  . aspirin EC 81 MG tablet Take 1 tablet (81 mg total) by mouth daily. 100 tablet 1  . atorvastatin (LIPITOR) 40 MG tablet Take 1 tablet (40 mg total) by mouth daily. 30 tablet 6  . carvedilol (COREG) 6.25 MG tablet Take 1 tablet (6.25 mg total) by mouth 2 (two) times daily with a meal. 60 tablet 6  . diclofenac sodium (VOLTAREN) 1 % GEL Apply 2  g topically 4 (four) times daily. 100 g 1  . metFORMIN (GLUCOPHAGE) 500 MG tablet Take 1 tablet (500 mg total) by mouth daily with breakfast. 90 tablet 3  . omeprazole (PRILOSEC) 20 MG capsule Take 1 capsule (20 mg total) by mouth daily. 30 capsule 6  . predniSONE (DELTASONE) 10 MG tablet Take 4 for four days 3 for four days 2 for four days 1 for four days 40 tablet 0  . Fluticasone-Salmeterol (ADVAIR) 100-50 MCG/DOSE AEPB Inhale 1 puff into the lungs 2 (two) times daily. 1 each 3  . loratadine (CLARITIN) 10 MG tablet Take 1 tablet (10 mg total) by mouth daily as needed for allergies. (Patient not taking: Reported on 10/01/2018) 30 tablet 1  . methocarbamol (ROBAXIN) 500 MG tablet Take 1 tablet (500 mg total) by mouth 2 (two) times daily as needed for muscle spasms. (Patient not taking: Reported on 10/01/2018) 30 tablet 0   No facility-administered medications prior to visit.     Review of Systems  Constitutional: Negative for chills, fever and weight loss.  HENT: Negative.   Eyes: Negative.   Respiratory: Negative for cough, sputum production, shortness of breath and wheezing.   Cardiovascular: Negative for chest pain and leg swelling.  Gastrointestinal: Negative.   Genitourinary: Negative.   Musculoskeletal: Negative for joint pain and myalgias.  Skin: Negative for rash.     Objective:   Vitals:   11/05/18 0929  BP: 130/70  Pulse: 80  Temp: 98.2 F (36.8 C)  TempSrc: Temporal  SpO2: 99%  Weight: 128 lb (58.1 kg)  Height: 4\' 10"  (1.473 m)   99% on  RA BMI Readings from Last 3 Encounters:  11/05/18 26.75 kg/m  10/01/18 26.56 kg/m  04/04/18 26.44 kg/m   Wt Readings from Last 3 Encounters:  11/05/18 128 lb (58.1 kg)  10/01/18 127 lb 6.4 oz (57.8 kg)  04/04/18 126 lb 12.8 oz (57.5 kg)    Physical Exam Vitals signs reviewed.  Constitutional:      General: She is not in acute distress.    Appearance: Normal appearance. She is not ill-appearing.  HENT:     Head:  Normocephalic and atraumatic.     Nose:     Comments: Deferred due to masking requirement.    Mouth/Throat:     Comments: Deferred due to masking requirement. Eyes:     General: No scleral icterus. Neck:     Musculoskeletal: Neck supple.  Cardiovascular:     Rate and Rhythm: Normal rate and regular  rhythm.     Heart sounds: No murmur.  Pulmonary:     Comments: Breathing comfortably on room air, no accessory muscle use or tachypnea.  No conversational dyspnea.  Clear to auscultation bilaterally.  No coughing. Abdominal:     General: There is no distension.     Palpations: Abdomen is soft.     Tenderness: There is no abdominal tenderness.  Musculoskeletal:        General: No swelling or tenderness.     Comments: Digital clubbing  Lymphadenopathy:     Cervical: No cervical adenopathy.  Skin:    General: Skin is warm and dry.     Findings: No rash.  Neurological:     General: No focal deficit present.     Mental Status: She is alert.     Motor: No weakness.     Coordination: Coordination normal.  Psychiatric:        Mood and Affect: Mood normal.        Behavior: Behavior normal.      CBC    Component Value Date/Time   WBC 10.1 09/19/2017 1208   WBC 29.8 (H) 08/15/2017 0611   RBC 5.17 09/19/2017 1208   RBC 5.40 (H) 08/15/2017 0611   HGB 13.6 09/19/2017 1208   HCT 42.4 09/19/2017 1208   PLT 330 09/19/2017 1208   MCV 82 09/19/2017 1208   MCH 26.3 (L) 09/19/2017 1208   MCH 27.0 08/15/2017 0611   MCHC 32.1 09/19/2017 1208   MCHC 32.8 08/15/2017 0611   RDW 15.1 09/19/2017 1208   LYMPHSABS 4.3 (H) 09/19/2017 1208   MONOABS 0.9 10/15/2016 1553   EOSABS 0.0 09/19/2017 1208   BASOSABS 0.0 09/19/2017 1208    10/15/2016 serologies SSA < 1 SSB < 1  SCL-70 < 1 ANA negative Anti-Jo < 0.2 Anticentromere < 1 RF < 14 HP panel negative   Chest Imaging- films reviewed: CT high resolution 10/10/2018- mosaicism throughout her lungs, but no inter or intralobular septal  thickening, no honeycombing.  No significant adenopathy or pleural disease.  No apparent progression radiographically, and possibly improved compared to 2018 images.  CT high resolution 09/14/2016-mosaicism throughout her lungs, specifically with less groundglass and several secondary lobules in the subpleural distribution.  More prominent mosaicism during expiration.  Pulmonary Functions Testing Results: PFT Results Latest Ref Rng & Units 06/18/2017 11/22/2016  FVC-Pre L 1.57 1.73  FVC-Predicted Pre % 58 63  FVC-Post L 1.58 1.76  FVC-Predicted Post % 58 64  Pre FEV1/FVC % % 91 92  Post FEV1/FCV % % 92 91  FEV1-Pre L 1.42 1.59  FEV1-Predicted Pre % 68 76  FEV1-Post L 1.46 1.60  DLCO UNC% % 65 67  DLCO COR %Predicted % 104 98  TLC L 2.99 2.81  TLC % Predicted % 69 65  RV % Predicted % 42 55  No significant obstruction, but severe restriction by TLC (nitrogen washout), but mild to moderate based on spirometry.  DLCO mildly reduced.  Flow volume loop supports restriction. In comparison to 2018 PFTs, her total lung capacity increased but her FEV1 & FVC were decreased and her DLCO was trivially decreased.   Echocardiogram: LVEF 60 to 65%, moderate LVH but normal diastolic function.  Normal LA, RV, RA.  Mild aortic sclerosis without stenosis, normal mitral valve, mild TR.    Assessment & Plan:     ICD-10-CM   1. Interstitial pulmonary disease (HCC)  J84.9   2. Restrictive lung disease  J98.4    Abnormal  chest CT with diffuse groundglass opacities without significant architectural destruction on CT.  Restriction on PFTs. Worsening during expiratory phase demonstrate air trapping.  I think this is most likely due to hypersensitivity pneumonitis, although asthma can also give this CT appearance.  She has not eliminated anything from her environment that we are aware of that would explain this improving rather than progressing over time.  Previous limited HP panel negative.  Luckily this is not  progressing over time. -No additional work-up needed unless she has worsening symptoms. - Switching Advair to Asmanex inhaler, 2 puffs 2 times daily.  If she has worsening symptoms she will let us know, and we may need to try a different LABA- ICS inhaler.  She will likely need financial assistance with this. -Flu shot today -Continue mask wearing, social distancing, handwashing COVID precautions.  RTC in 6 months or sooner if needed.   Current Outpatient Medications:  .  amLODipine (NORVASC) 10 MG tablet, Take 1 tablet (10 mg total) by mouth daily., Disp: 90 tablet, Rfl: 3 .  aspirin EC 81 MG tablet, Take 1 tablet (81 mg total) by mouth daily., Disp: 100 tablet, Rfl: 1 .  atorvastatin (LIPITOR) 40 MG tablet, Take 1 tablet (40 mg total) by mouth daily., Disp: 30 tablet, Rfl: 6 .  carvedilol (COREG) 6.25 MG tablet, Take 1 tablet (6.25 mg total) by mouth 2 (two) times daily with a meal., Disp: 60 tablet, Rfl: 6 .  diclofenac sodium (VOLTAREN) 1 % GEL, Apply 2 g topically 4 (four) times daily., Disp: 100 g, Rfl: 1 .  metFORMIN (GLUCOPHAGE) 500 MG tablet, Take 1 tablet (500 mg total) by mouth daily with breakfast., Disp: 90 tablet, Rfl: 3 .  omeprazole (PRILOSEC) 20 MG capsule, Take 1 capsule (20 mg total) by mouth daily., Disp: 30 capsule, Rfl: 6 .  predniSONE (DELTASONE) 10 MG tablet, Take 4 for four days 3 for four days 2 for four days 1 for four days, Disp: 40 tablet, Rfl: 0 .  mometasone (ASMANEX, 60 METERED DOSES,) 220 MCG/INH inhaler, Inhale 2 puffs into the lungs 2 (two) times daily., Disp: 1 Inhaler, Rfl: 12 .  Mometasone Furoate (ASMANEX HFA) 200 MCG/ACT AERO, Inhale 2 puffs into the lungs 2 (two) times daily., Disp: 39 g, Rfl: 0   Julian Hy, DO [facility] 11/05/2018 10:15 AM

## 2018-12-19 ENCOUNTER — Ambulatory Visit: Payer: Self-pay | Admitting: Internal Medicine

## 2019-01-09 ENCOUNTER — Other Ambulatory Visit: Payer: Self-pay

## 2019-01-09 ENCOUNTER — Ambulatory Visit: Payer: Self-pay | Attending: Internal Medicine | Admitting: Internal Medicine

## 2019-01-09 DIAGNOSIS — E119 Type 2 diabetes mellitus without complications: Secondary | ICD-10-CM

## 2019-01-09 DIAGNOSIS — Z1231 Encounter for screening mammogram for malignant neoplasm of breast: Secondary | ICD-10-CM

## 2019-01-09 DIAGNOSIS — E785 Hyperlipidemia, unspecified: Secondary | ICD-10-CM

## 2019-01-09 DIAGNOSIS — J849 Interstitial pulmonary disease, unspecified: Secondary | ICD-10-CM

## 2019-01-09 DIAGNOSIS — I10 Essential (primary) hypertension: Secondary | ICD-10-CM

## 2019-01-09 DIAGNOSIS — R0789 Other chest pain: Secondary | ICD-10-CM

## 2019-01-09 DIAGNOSIS — R079 Chest pain, unspecified: Secondary | ICD-10-CM

## 2019-01-09 DIAGNOSIS — E1169 Type 2 diabetes mellitus with other specified complication: Secondary | ICD-10-CM

## 2019-01-09 MED ORDER — AMLODIPINE BESYLATE 10 MG PO TABS: 10.0000 mg | ORAL_TABLET | Freq: Every day | ORAL | 6 refills | Status: DC

## 2019-01-09 MED ORDER — ATORVASTATIN CALCIUM 40 MG PO TABS: 40.0000 mg | ORAL_TABLET | Freq: Every day | ORAL | 6 refills | Status: DC

## 2019-01-09 NOTE — Progress Notes (Signed)
Virtual Visit via Telephone Note Due to current restrictions/limitations of in-office visits due to the COVID-19 pandemic, this scheduled clinical appointment was converted to a telehealth visit  I connected with Tammy Osborne on 01/09/19 at 10:10 a.m by telephone and verified that I am speaking with the correct person using two identifiers. I am in my office.  The patient is at home.  Only the patient, myself and Nindik with [employer] [phone]) participated in this encounter.  I discussed the limitations, risks, security and privacy concerns of performing an evaluation and management service by telephone and the availability of in person appointments. I also discussed with the patient that there may be a patient responsible charge related to this service. The patient expressed understanding and agreed to proceed.   History of Present Illness: Patient with history of HTN, DM 2,probableILD, HL, anemia.  Purpose of today's visit is chronic disease management.  Last eval by 08/2018  ILD: She saw Dr. Delford Field since last visit with me.  Had repeat high-resolution CT of the chest that again showed widespread groundglass attenuation with improvement compared to 2018.  Again aortic atherosclerosis and three-vessel coronary artery disease noted.  He resubmitted the referral for patient to see the cardiologist.  He stopped the ACE inhibitor in case it was contributing to her cough.  This was changed to amlodipine instead.  He also referred her to [facility] Dr. Chestine Spore.  She recommend changing Advair to Asmanex inhaler.  Today patient reports cough has improved.  "It is going away."  HL:  Taking and tolerating Lipitor consistently.  Needs RF  HTN/coronary atherosclerosis on CT of the chest:  ACE-I d/c due to cough. Placed on Norvasc instead.  Reports compliance with Norvasc and Coreg. She limits salt in foods.  BP at pulmonary appt with Dr. Chestine Spore was at goal -referred to cardiology by me and  Dr. Delford Field.  They called her and left VMM but pt states she does not recall and if message was in english she could not understand it.  Reports no recent CP  DM:  Not checking BS.  Compliant with Metformin.  Reports eating habits are good. Walking 20-25 mins a day.  Last A1c was 6   HM: did mail FIT test but I have not received results.  Due for MMG and eye exam. Pt does not have insurance  Outpatient Encounter Medications as of 01/09/2019  Medication Sig Note  . amLODipine (NORVASC) 10 MG tablet Take 1 tablet (10 mg total) by mouth daily.   Marland Kitchen aspirin EC 81 MG tablet Take 1 tablet (81 mg total) by mouth daily.   Marland Kitchen atorvastatin (LIPITOR) 40 MG tablet Take 1 tablet (40 mg total) by mouth daily.   . carvedilol (COREG) 6.25 MG tablet Take 1 tablet (6.25 mg total) by mouth 2 (two) times daily with a meal.   . diclofenac sodium (VOLTAREN) 1 % GEL Apply 2 g topically 4 (four) times daily. 08/15/2017: Last fill date 5/2  . metFORMIN (GLUCOPHAGE) 500 MG tablet Take 1 tablet (500 mg total) by mouth daily with breakfast.   . mometasone (ASMANEX, 60 METERED DOSES,) 220 MCG/INH inhaler Inhale 2 puffs into the lungs 2 (two) times daily.   . Mometasone Furoate (ASMANEX HFA) 200 MCG/ACT AERO Inhale 2 puffs into the lungs 2 (two) times daily.   Marland Kitchen omeprazole (PRILOSEC) 20 MG capsule Take 1 capsule (20 mg total) by mouth daily.   . predniSONE (DELTASONE) 10 MG tablet Take 4 for four days 3  for four days 2 for four days 1 for four days (Patient not taking: Reported on 01/09/2019)    No facility-administered encounter medications on file as of 01/09/2019.      Observations/Objective: Results for orders placed or performed in visit on 10/01/18  Glucose (CBG)  Result Value Ref Range   POC Glucose 111 (A) 70 - 99 mg/dl  HgB A1c  Result Value Ref Range   Hemoglobin A1C 6.0 (A) 4.0 - 5.6 %   HbA1c POC (<> result, manual entry)     HbA1c, POC (prediabetic range)     HbA1c, POC (controlled diabetic range)        Assessment and Plan: 1. Controlled type 2 diabetes mellitus without complication, without long-term current use of insulin (HCC) Continue Metformin and healthy eating habits. - CBC; Future - Comprehensive metabolic panel; Future - Lipid panel; Future - Microalbumin / creatinine urine ratio; Future - Hemoglobin A1c; Future  2. Essential hypertension At goal.  Continue current medication.  ACE inhibitor stopped and cough has improved. - amLODipine (NORVASC) 10 MG tablet; Take 1 tablet (10 mg total) by mouth daily.  Dispense: 30 tablet; Refill: 6  3. Hyperlipidemia associated with type 2 diabetes mellitus (HCC) - atorvastatin (LIPITOR) 40 MG tablet; Take 1 tablet (40 mg total) by mouth daily.  Dispense: 30 tablet; Refill: 6  4. Interstitial pulmonary disease ([place]) Patient reports symptomatic improvement. Imaging study shows some regression of disease in the lung parenchyma  5. Encounter for screening mammogram for malignant neoplasm of breast - MM Digital Screening; Future  6. Chest pain, unspecified type -ask [name] to call and get appt for her  HM: I lab tech was able to find the results of her fit test.  It was negative.  It was resulted on 09/27/2018 Follow Up Instructions: 4 mths   I discussed the assessment and treatment plan with the patient. The patient was provided an opportunity to ask questions and all were answered. The patient agreed with the plan and demonstrated an understanding of the instructions.   The patient was advised to call back or seek an in-person evaluation if the symptoms worsen or if the condition fails to improve as anticipated.  I provided 20 minutes of non-face-to-face time during this encounter.   Karle Plumber, MD

## 2019-01-19 ENCOUNTER — Telehealth: Payer: Self-pay | Admitting: Internal Medicine

## 2019-01-19 NOTE — Telephone Encounter (Signed)
Lvm to patient  she has an appointment  02-02-2019 @ 4pm at  [facility] office    [address]  [place], [place] [address]     [phone]

## 2019-01-20 ENCOUNTER — Telehealth: Payer: Self-pay | Admitting: Internal Medicine

## 2019-01-20 NOTE — Telephone Encounter (Signed)
-----   Message from Ena Dawley sent at 01/19/2019 11:17 AM EST ----- Gm  Dr Wynetta Emery  Lvm to patient  she has an appointment  02-02-2019 @ 4pm at  [facility] office    [address]  [place], [place] [address]     [phone] ----- Message ----- From: Ladell Pier, MD Sent: 01/09/2019   6:31 PM EST To: Ena Dawley  Can you please call cardiology and get an appointment for this patient and then call her and give her the date.  They have called her but patient states that she does not understand the message when left in [place].

## 2019-02-01 NOTE — Progress Notes (Deleted)
Cardiology Office Note:   Date:  02/01/2019  NAME:  Tammy Osborne    MRN: [id number] DOB:  November 12, 1955   PCP:  Ladell Pier, MD  Cardiologist:  No primary care provider on file.  Electrophysiologist:  None   Referring MD: Elsie Stain, MD   No chief complaint on file. ***  History of Present Illness:   Tammy Osborne is a 64 y.o. female with a hx of DM, HTN, ILD who is being seen today for the evaluation of CAD at the request of Elsie Stain, MD.  CT chest 10/11/2018 shows mild to moderate calcium in all 3 vessels, personally reviewed.   A1c 6.0 T chol 193 TG 338 HDL 36 LDL 89   Problem List 1. Interstitial lung disease/hypersensitivity pneumonitis - stable per pulm visit 11/05/2018 2. Diabetes 3. Hypertension 4. CAD - CAC seen on CT chest   Past Medical History: Past Medical History:  Diagnosis Date  . Diabetes mellitus without complication ([place])   . Hypertension     Past Surgical History: Past Surgical History:  Procedure Laterality Date  . ABDOMINAL HYSTERECTOMY      Current Medications: No outpatient medications have been marked as taking for the 02/02/19 encounter (Appointment) with O'Neal, Cassie Freer, MD.     Allergies:    Patient has no known allergies.   Social History: Social History   Socioeconomic History  . Marital status: Married    Spouse name: Not on file  . Number of children: Not on file  . Years of education: Not on file  . Highest education level: Not on file  Occupational History  . Not on file  Tobacco Use  . Smoking status: Passive Smoke Exposure - Never Smoker  . Smokeless tobacco: Never Used  . Tobacco comment: spouse smoked in home X6-7 years.   Substance and Sexual Activity  . Alcohol use: No  . Drug use: Not on file  . Sexual activity: Not on file  Other Topics Concern  . Not on file  Social History Narrative  . Not on file   Social Determinants of Health   Financial Resource Strain:   . Difficulty of  Paying Living Expenses: Not on file  Food Insecurity:   . Worried About Charity fundraiser in the Last Year: Not on file  . Ran Out of Food in the Last Year: Not on file  Transportation Needs:   . Lack of Transportation (Medical): Not on file  . Lack of Transportation (Non-Medical): Not on file  Physical Activity:   . Days of Exercise per Week: Not on file  . Minutes of Exercise per Session: Not on file  Stress:   . Feeling of Stress : Not on file  Social Connections:   . Frequency of Communication with Friends and Family: Not on file  . Frequency of Social Gatherings with Friends and Family: Not on file  . Attends Religious Services: Not on file  . Active Member of Clubs or Organizations: Not on file  . Attends Archivist Meetings: Not on file  . Marital Status: Not on file     Family History: The patient's ***family history is not on file.  ROS:   All other ROS reviewed and negative. Pertinent positives noted in the HPI.     EKGs/Labs/Other Studies Reviewed:   The following studies were personally reviewed by me today:  EKG:  EKG is *** ordered today.  The ekg ordered today demonstrates ***, and was  personally reviewed by me.   Recent Labs: No results found for requested labs within last 8760 hours.   Recent Lipid Panel    Component Value Date/Time   CHOL 193 09/19/2017 1208   TRIG 338 (H) 09/19/2017 1208   HDL 36 (L) 09/19/2017 1208   CHOLHDL 5.4 (H) 09/19/2017 1208   LDLCALC 89 09/19/2017 1208    Physical Exam:   VS:  LMP  (LMP Unknown)    Wt Readings from Last 3 Encounters:  11/05/18 128 lb (58.1 kg)  10/01/18 127 lb 6.4 oz (57.8 kg)  04/04/18 126 lb 12.8 oz (57.5 kg)    General: Well nourished, well developed, in no acute distress Heart: Atraumatic, normal size  Eyes: PEERLA, EOMI  Neck: Supple, no JVD Endocrine: No thryomegaly Cardiac: Normal S1, S2; RRR; no murmurs, rubs, or gallops Lungs: Clear to auscultation bilaterally, no wheezing,  rhonchi or rales  Abd: Soft, nontender, no hepatomegaly  Ext: No edema, pulses 2+ Musculoskeletal: No deformities, BUE and BLE strength normal and equal Skin: Warm and dry, no rashes   Neuro: Alert and oriented to person, place, time, and situation, CNII-XII grossly intact, no focal deficits  Psych: Normal mood and affect   ASSESSMENT:   Tammy Osborne is a 64 y.o. female who presents for the following: No diagnosis found.  PLAN:   There are no diagnoses linked to this encounter.  Disposition: No follow-ups on file.  Medication Adjustments/Labs and Tests Ordered: Current medicines are reviewed at length with the patient today.  Concerns regarding medicines are outlined above.  No orders of the defined types were placed in this encounter.  No orders of the defined types were placed in this encounter.   There are no Patient Instructions on file for this visit.   Signed, Lenna Gilford. Flora Lipps, MD [facility]  [address] [place], [place] [address] [phone]  02/01/2019 5:58 PM

## 2019-02-02 ENCOUNTER — Ambulatory Visit: Payer: Self-pay | Admitting: Cardiovascular Disease

## 2019-02-03 ENCOUNTER — Ambulatory Visit: Payer: Self-pay | Attending: Internal Medicine

## 2019-02-03 ENCOUNTER — Other Ambulatory Visit: Payer: Self-pay

## 2019-02-03 DIAGNOSIS — E119 Type 2 diabetes mellitus without complications: Secondary | ICD-10-CM

## 2019-02-04 ENCOUNTER — Other Ambulatory Visit: Payer: Self-pay | Admitting: Internal Medicine

## 2019-02-04 DIAGNOSIS — R748 Abnormal levels of other serum enzymes: Secondary | ICD-10-CM

## 2019-02-04 LAB — COMPREHENSIVE METABOLIC PANEL
ALT: 17 IU/L (ref 0–32)
AST: 18 IU/L (ref 0–40)
Albumin/Globulin Ratio: 1.9 (ref 1.2–2.2)
Albumin: 4.7 g/dL (ref 3.8–4.8)
Alkaline Phosphatase: 184 IU/L — ABNORMAL HIGH (ref 39–117)
BUN/Creatinine Ratio: 15 (ref 12–28)
BUN: 10 mg/dL (ref 8–27)
Bilirubin Total: 0.3 mg/dL (ref 0.0–1.2)
CO2: 24 mmol/L (ref 20–29)
Calcium: 10 mg/dL (ref 8.7–10.3)
Chloride: 104 mmol/L (ref 96–106)
Creatinine, Ser: 0.68 mg/dL (ref 0.57–1.00)
GFR calc Af Amer: 108 mL/min/{1.73_m2} (ref >59)
GFR calc non Af Amer: 94 mL/min/{1.73_m2} (ref >59)
Globulin, Total: 2.5 g/dL (ref 1.5–4.5)
Glucose: 96 mg/dL (ref 65–99)
Potassium: 4.4 mmol/L (ref 3.5–5.2)
Sodium: 143 mmol/L (ref 134–144)
Total Protein: 7.2 g/dL (ref 6.0–8.5)

## 2019-02-04 LAB — MICROALBUMIN / CREATININE URINE RATIO
Creatinine, Urine: 163.9 mg/dL
Microalb/Creat Ratio: 26 mg/g creat (ref 0–29)
Microalbumin, Urine: 42.4 ug/mL

## 2019-02-04 LAB — HEMOGLOBIN A1C
Est. average glucose Bld gHb Est-mCnc: 126 mg/dL
Hgb A1c MFr Bld: 6 % — ABNORMAL HIGH (ref 4.8–5.6)

## 2019-02-04 LAB — LIPID PANEL
Chol/HDL Ratio: 5.8 ratio — ABNORMAL HIGH (ref 0.0–4.4)
Cholesterol, Total: 210 mg/dL — ABNORMAL HIGH (ref 100–199)
HDL: 36 mg/dL — ABNORMAL LOW (ref >39)
LDL Chol Calc (NIH): 127 mg/dL — ABNORMAL HIGH (ref 0–99)
Triglycerides: 262 mg/dL — ABNORMAL HIGH (ref 0–149)
VLDL Cholesterol Cal: 47 mg/dL — ABNORMAL HIGH (ref 5–40)

## 2019-02-04 LAB — CBC
Hematocrit: 44 % (ref 34.0–46.6)
Hemoglobin: 14.4 g/dL (ref 11.1–15.9)
MCH: 26.2 pg — ABNORMAL LOW (ref 26.6–33.0)
MCHC: 32.7 g/dL (ref 31.5–35.7)
MCV: 80 fL (ref 79–97)
Platelets: 423 10*3/uL (ref 150–450)
RBC: 5.49 x10E6/uL — ABNORMAL HIGH (ref 3.77–5.28)
RDW: 14.4 % (ref 11.7–15.4)
WBC: 9 10*3/uL (ref 3.4–10.8)

## 2019-02-06 ENCOUNTER — Telehealth: Payer: Self-pay

## 2019-02-06 NOTE — Telephone Encounter (Signed)
[employer] Judeth Cornfield  Id#  [id number] contacted pt to go over lab results pt didn't answer and was unable to lvm

## 2019-02-15 NOTE — Progress Notes (Signed)
Cardiology Office Note:   Date:  02/17/2019  NAME:  Tammy Osborne    MRN: [id number] DOB:  [birth year]   PCP:  Marcine Matar, MD  Cardiologist:  No primary care provider on file.   Referring MD: Storm Frisk, MD   Chief Complaint  Patient presents with  . Chest Pain   History of Present Illness:   Tammy Osborne is a 64 y.o. female with a hx of diabetes, hypertension, hyperlipidemia who is being seen today for the evaluation of chest pain at the request of Storm Frisk, MD.  She reports 1 month ago she developed exertional chest pressure.  She reports central pressure in her chest when she exerted herself.  She reports this pain would last up to 1 to 2 hours after she stopped activity.  She reports at times it would last less than this.  She reports a recent change in blood pressure medications and she is had no further episodes.  Her pulmonologist sent her for evaluation of chest pain in the setting of coronary calcium that was seen on a CT scan.  She seems to be doing much better but was bothered by it.  Her EKG today demonstrates nonspecific ST-T changes no evidence of prior infarction.  She does endorse shortness of breath but this is likely related to her interstitial lung disease.  This apparently is well treated by her pulmonologist.  She does work intermittently in a factory and has no major issues.  Cardiovascular disease risk factors include diabetes, hypertension, hyperlipidemia.  Her blood pressure is not well controlled today.  She reports she has fluctuations in it.  On review of prior values, they are within normal limits.  She had an echocardiogram in 2018 showed normal left ventricular function and no regional wall motion abnormalities.  Problem List 1. Diabetes (A1c 6.0) 2. Hypertension  3. Hyperlipidemia  4. Interstitial lung disease  5. CAD (CAC seen on CT scan)  Total cholesterol 210, HDL 36, LDL 127, triglycerides 262  Past Medical History: Past Medical  History:  Diagnosis Date  . Diabetes mellitus without complication (HCC)   . Hypertension     Past Surgical History: Past Surgical History:  Procedure Laterality Date  . ABDOMINAL HYSTERECTOMY      Current Medications: Current Meds  Medication Sig  . amLODipine (NORVASC) 10 MG tablet Take 1 tablet (10 mg total) by mouth daily.  Marland Kitchen aspirin EC 81 MG tablet Take 1 tablet (81 mg total) by mouth daily.  Marland Kitchen atorvastatin (LIPITOR) 40 MG tablet Take 1 tablet (40 mg total) by mouth daily.  . carvedilol (COREG) 6.25 MG tablet Take 1 tablet (6.25 mg total) by mouth 2 (two) times daily with a meal.  . diclofenac sodium (VOLTAREN) 1 % GEL Apply 2 g topically 4 (four) times daily.  . metFORMIN (GLUCOPHAGE) 500 MG tablet Take 1 tablet (500 mg total) by mouth daily with breakfast.  . Mometasone Furoate (ASMANEX HFA) 200 MCG/ACT AERO Inhale 2 puffs into the lungs 2 (two) times daily.  Marland Kitchen omeprazole (PRILOSEC) 20 MG capsule Take 1 capsule (20 mg total) by mouth daily.  . [DISCONTINUED] mometasone (ASMANEX, 60 METERED DOSES,) 220 MCG/INH inhaler Inhale 2 puffs into the lungs 2 (two) times daily.     Allergies:    Patient has no known allergies.   Social History: Social History   Socioeconomic History  . Marital status: Single    Spouse name: Not on file  . Number of children: 2  .  Years of education: Not on file  . Highest education level: Not on file  Occupational History  . Not on file  Tobacco Use  . Smoking status: Passive Smoke Exposure - Never Smoker  . Smokeless tobacco: Never Used  . Tobacco comment: spouse smoked in home X6-7 years.   Substance and Sexual Activity  . Alcohol use: No  . Drug use: Not on file  . Sexual activity: Not on file  Other Topics Concern  . Not on file  Social History Narrative  . Not on file   Social Determinants of Health   Financial Resource Strain:   . Difficulty of Paying Living Expenses: Not on file  Food Insecurity:   . Worried About Community education officer in the Last Year: Not on file  . Ran Out of Food in the Last Year: Not on file  Transportation Needs:   . Lack of Transportation (Medical): Not on file  . Lack of Transportation (Non-Medical): Not on file  Physical Activity:   . Days of Exercise per Week: Not on file  . Minutes of Exercise per Session: Not on file  Stress:   . Feeling of Stress : Not on file  Social Connections:   . Frequency of Communication with Friends and Family: Not on file  . Frequency of Social Gatherings with Friends and Family: Not on file  . Attends Religious Services: Not on file  . Active Member of Clubs or Organizations: Not on file  . Attends Banker Meetings: Not on file  . Marital Status: Not on file     Family History: The patient's family history includes Hypertension in her mother.  ROS:   All other ROS reviewed and negative. Pertinent positives noted in the HPI.     EKGs/Labs/Other Studies Reviewed:   The following studies were personally reviewed by me today:  EKG:  EKG is ordered today.  The ekg ordered today demonstrates normal sinus rhythm, rate 72, nonspecific ST-T changes, and was personally reviewed by me.   TTE 2018 - Left ventricle: The cavity size was normal. Wall thickness was   increased in a pattern of moderate LVH. Systolic function was   normal. The estimated ejection fraction was in the range of 60%   to 65%. Wall motion was normal; there were no regional wall   motion abnormalities. Left ventricular diastolic function   parameters were normal. - Atrial septum: No defect or patent foramen ovale was identified.  CT Chest 10/10/2018 1. Compared to the prior examination from 2018, the widespread ground-glass attenuation seen on the prior study has significantly regressed. These findings are of uncertain etiology and significance, but could reflect resolving chronic hypersensitivity pneumonitis if there have been measures to reduce antigen  exposure or other clinical therapy. 2. Aortic atherosclerosis, in addition to three-vessel coronary artery disease. Please note that although the presence of coronary artery calcium documents the presence of coronary artery disease, the severity of this disease and any potential stenosis cannot be assessed on this non-gated CT examination. Assessment for potential risk factor modification, dietary therapy or pharmacologic therapy may be warranted, if clinically indicated.  Recent Labs: 02/03/2019: ALT 17; BUN 10; Creatinine, Ser 0.68; Hemoglobin 14.4; Platelets 423; Potassium 4.4; Sodium 143   Recent Lipid Panel    Component Value Date/Time   CHOL 210 (H) 02/03/2019 0924   TRIG 262 (H) 02/03/2019 0924   HDL 36 (L) 02/03/2019 0924   CHOLHDL 5.8 (H) 02/03/2019 0924   LDLCALC  127 (H) 02/03/2019 1610    Physical Exam:   VS:  BP (!) 162/72   Pulse 76   Ht 4\' 10"  (1.473 m)   Wt 126 lb 9.6 oz (57.4 kg)   LMP  (LMP Unknown)   SpO2 96%   BMI 26.46 kg/m    Wt Readings from Last 3 Encounters:  02/17/19 126 lb 9.6 oz (57.4 kg)  11/05/18 128 lb (58.1 kg)  10/01/18 127 lb 6.4 oz (57.8 kg)    General: Well nourished, well developed, in no acute distress Heart: Atraumatic, normal size  Eyes: PEERLA, EOMI  Neck: Supple, no JVD Endocrine: No thryomegaly Cardiac: Normal S1, S2; RRR; no murmurs, rubs, or gallops Lungs: Clear to auscultation bilaterally, no wheezing, rhonchi or rales  Abd: Soft, nontender, no hepatomegaly  Ext: No edema, pulses 2+ Musculoskeletal: No deformities, BUE and BLE strength normal and equal Skin: Warm and dry, no rashes   Neuro: Alert and oriented to person, place, time, and situation, CNII-XII grossly intact, no focal deficits  Psych: Normal mood and affect   ASSESSMENT:   Tammy Osborne is a 64 y.o. female who presents for the following: 1. Chest pain, unspecified type   2. Essential hypertension   3. Mixed hyperlipidemia     PLAN:   1. Chest pain,  unspecified type -She presents with symptoms concerning for angina.  It is unclear what medication was started that resolved her symptoms but I suspect it may have been a beta-blocker.  We will continue her carvedilol 6.25 mg twice daily for now.  I think we should proceed with a Lexiscan nuclear medicine stress test.  She has significant coronary calcification seen on recent CT scan we need to exclude obstructive CAD as H and of her symptoms. -She will continue on aspirin and statin for her diabetes -We will need to increase her atorvastatin to get her LDL less than 70 at her next visit -Given that she is had no further recurrence of symptoms in the past month we will hold on nitroglycerin at this time.  She was given strict return precautions if her chest pain recurs.  She will also let 68 know if she has any issues. -We will see her back in 1 month after her stress test  2. Essential hypertension -A bit elevated today.  Well controlled at other visits.  We will hold on any medication change today.  3. Mixed hyperlipidemia -LDL not at goal.  We will need to increase her Lipitor to 80 mg at her next visit.  Disposition: Return in about 3 months (around 05/18/2019).  Medication Adjustments/Labs and Tests Ordered: Current medicines are reviewed at length with the patient today.  Concerns regarding medicines are outlined above.  Orders Placed This Encounter  Procedures  . MYOCARDIAL PERFUSION IMAGING  . EKG 12-Lead   No orders of the defined types were placed in this encounter.   Patient Instructions  Medication Instructions:  The current medical regimen is effective;  continue present plan and medications.  *If you need a refill on your cardiac medications before your next appointment, please call your pharmacy*  Testing/Procedures: Your physician has requested that you have a lexiscan myoview. A cardiac stress test is a cardiological test that measures the heart's ability to respond  to external stress in a controlled clinical environment. The stress response is induced by intravenous pharmacological stimulation.   Follow-Up: At [facility], you and your health needs are our priority.  As part of our continuing mission  to provide you with exceptional heart care, we have created designated Provider Care Teams.  These Care Teams include your primary Cardiologist (physician) and Advanced Practice Providers (APPs -  Physician Assistants and Nurse Practitioners) who all work together to provide you with the care you need, when you need it.  Your next appointment:   3 month(s)  The format for your next appointment:   In Person  Provider:   Eleonore Chiquito, MD       Signed, Addison Naegeli. Audie Box, [facility]  [address], [place],  [address] [phone]  02/17/2019 12:19 PM

## 2019-02-17 ENCOUNTER — Encounter: Payer: Self-pay | Admitting: Cardiovascular Disease

## 2019-02-17 ENCOUNTER — Other Ambulatory Visit: Payer: Self-pay

## 2019-02-17 ENCOUNTER — Ambulatory Visit (INDEPENDENT_AMBULATORY_CARE_PROVIDER_SITE_OTHER): Payer: Self-pay | Admitting: Cardiovascular Disease

## 2019-02-17 VITALS — BP 162/72 | HR 76 | Ht <= 58 in | Wt 126.6 lb

## 2019-02-17 DIAGNOSIS — I10 Essential (primary) hypertension: Secondary | ICD-10-CM

## 2019-02-17 DIAGNOSIS — E782 Mixed hyperlipidemia: Secondary | ICD-10-CM

## 2019-02-17 DIAGNOSIS — R079 Chest pain, unspecified: Secondary | ICD-10-CM

## 2019-02-17 NOTE — Patient Instructions (Signed)
Medication Instructions:  The current medical regimen is effective;  continue present plan and medications.  *If you need a refill on your cardiac medications before your next appointment, please call your pharmacy*  Testing/Procedures: Your physician has requested that you have a lexiscan myoview. A cardiac stress test is a cardiological test that measures the heart's ability to respond to external stress in a controlled clinical environment. The stress response is induced by intravenous pharmacological stimulation.   Follow-Up: At [facility], you and your health needs are our priority.  As part of our continuing mission to provide you with exceptional heart care, we have created designated Provider Care Teams.  These Care Teams include your primary Cardiologist (physician) and Advanced Practice Providers (APPs -  Physician Assistants and Nurse Practitioners) who all work together to provide you with the care you need, when you need it.  Your next appointment:   3 month(s)  The format for your next appointment:   In Person  Provider:   Lennie Odor, MD

## 2019-02-19 ENCOUNTER — Telehealth (HOSPITAL_COMMUNITY): Payer: Self-pay

## 2019-02-19 NOTE — Telephone Encounter (Signed)
Encounter complete. 

## 2019-02-20 ENCOUNTER — Telehealth (HOSPITAL_COMMUNITY): Payer: Self-pay

## 2019-02-20 NOTE — Telephone Encounter (Signed)
Encounter complete. 

## 2019-02-24 ENCOUNTER — Ambulatory Visit (HOSPITAL_COMMUNITY)
Admission: RE | Admit: 2019-02-24 | Discharge: 2019-02-24 | Disposition: A | Discharge status: Home / Self Care | Payer: Self-pay | Source: Ambulatory Visit | Attending: Cardiovascular Disease | Admitting: Cardiovascular Disease

## 2019-02-24 ENCOUNTER — Encounter (HOSPITAL_COMMUNITY): Payer: Self-pay

## 2019-02-24 ENCOUNTER — Other Ambulatory Visit: Payer: Self-pay

## 2019-02-24 DIAGNOSIS — R079 Chest pain, unspecified: Secondary | ICD-10-CM | POA: Insufficient documentation

## 2019-02-24 LAB — MYOCARDIAL PERFUSION IMAGING
LV dias vol: 65 mL (ref 46–106)
LV sys vol: 17 mL
Peak HR: 106 {beats}/min
Rest HR: 75 {beats}/min
SDS: 1
SRS: 1
SSS: 2
TID: 1.05

## 2019-02-24 MED ORDER — AMINOPHYLLINE 25 MG/ML IV SOLN
75.0000 mg | Freq: Once | INTRAVENOUS | Status: AC
  Administered 2019-02-24: 75 mg via INTRAVENOUS

## 2019-02-24 MED ORDER — REGADENOSON 0.4 MG/5ML IV SOLN
0.4000 mg | Freq: Once | INTRAVENOUS | Status: AC
  Administered 2019-02-24: 0.4 mg via INTRAVENOUS

## 2019-02-24 MED ORDER — TECHNETIUM TC 99M TETROFOSMIN IV KIT
30.8000 | PACK | Freq: Once | INTRAVENOUS | Status: AC | PRN
  Administered 2019-02-24: 11:00:00 30.8 via INTRAVENOUS
  Filled 2019-02-24: qty 31

## 2019-02-24 MED ORDER — TECHNETIUM TC 99M TETROFOSMIN IV KIT
10.2000 | PACK | Freq: Once | INTRAVENOUS | Status: AC | PRN
  Administered 2019-02-24: 10.2 via INTRAVENOUS
  Filled 2019-02-24: qty 11

## 2019-04-17 ENCOUNTER — Ambulatory Visit: Payer: Self-pay | Attending: Internal Medicine

## 2019-04-17 ENCOUNTER — Other Ambulatory Visit: Payer: Self-pay

## 2019-04-28 ENCOUNTER — Telehealth: Payer: Self-pay | Admitting: Internal Medicine

## 2019-04-28 NOTE — Telephone Encounter (Signed)
Pt was sent a letter from financial dept. Inform them, that the application they submitted was incomplete, since they were missing some documentation at the time of the appointment, Pt need to reschedule and resubmit all new papers and application for CAFA and OC, P.S. old documents has been sent back by mail to the Pt and Pt. need to make a new appt. 

## 2019-05-06 NOTE — Progress Notes (Deleted)
Cardiology Office Note:   Date:  05/06/2019  NAME:  Tammy Osborne    MRN: [id number] DOB:  1955/12/01   PCP:  Ladell Pier, MD  Cardiologist:  No primary care provider on file.  Electrophysiologist:  None   Referring MD: Ladell Pier, MD   No chief complaint on file. ***  History of Present Illness:   Tammy Osborne is a 64 y.o. female with a hx of diabetes, HTN, HLD who presents for follow-up of chest pain. Reported exertional chest pressure that lasted hours. NM Stress normal. We need to increase her lipitor and get repeat.    Problem List 1. Diabetes (A1c 6.0) 2. Hypertension  3. Hyperlipidemia   -T chol 210, HDL 36, LDL 127, TG 262 4. Interstitial lung disease  5. CAD (CAC seen on CT scan) -normal MPI 02/24/2019  Past Medical History: Past Medical History:  Diagnosis Date  . Diabetes mellitus without complication ([place])   . Hypertension     Past Surgical History: Past Surgical History:  Procedure Laterality Date  . ABDOMINAL HYSTERECTOMY      Current Medications: No outpatient medications have been marked as taking for the 05/07/19 encounter (Appointment) with O'Neal, Cassie Freer, MD.     Allergies:    Patient has no known allergies.   Social History: Social History   Socioeconomic History  . Marital status: Single    Spouse name: Not on file  . Number of children: 2  . Years of education: Not on file  . Highest education level: Not on file  Occupational History  . Not on file  Tobacco Use  . Smoking status: Passive Smoke Exposure - Never Smoker  . Smokeless tobacco: Never Used  . Tobacco comment: spouse smoked in home X6-7 years.   Substance and Sexual Activity  . Alcohol use: No  . Drug use: Not on file  . Sexual activity: Not on file  Other Topics Concern  . Not on file  Social History Narrative  . Not on file   Social Determinants of Health   Financial Resource Strain:   . Difficulty of Paying Living Expenses:    Food Insecurity:   . Worried About Charity fundraiser in the Last Year:   . Arboriculturist in the Last Year:   Transportation Needs:   . Film/video editor (Medical):   Marland Kitchen Lack of Transportation (Non-Medical):   Physical Activity:   . Days of Exercise per Week:   . Minutes of Exercise per Session:   Stress:   . Feeling of Stress :   Social Connections:   . Frequency of Communication with Friends and Family:   . Frequency of Social Gatherings with Friends and Family:   . Attends Religious Services:   . Active Member of Clubs or Organizations:   . Attends Archivist Meetings:   Marland Kitchen Marital Status:      Family History: The patient's ***family history includes Hypertension in her mother.  ROS:   All other ROS reviewed and negative. Pertinent positives noted in the HPI.     EKGs/Labs/Other Studies Reviewed:   The following studies were personally reviewed by me today:  EKG:  EKG is *** ordered today.  The ekg ordered today demonstrates ***, and was personally reviewed by me.   NM Stress 02/24/2019  Nuclear stress EF: 73%. The left ventricular ejection fraction is hyperdynamic (>65%).  There was no ST segment deviation noted during stress.  This is a low risk study.  The study is normal. There is no evidence of ischemia or previous infarction.  Recent Labs: 02/03/2019: ALT 17; BUN 10; Creatinine, Ser 0.68; Hemoglobin 14.4; Platelets 423; Potassium 4.4; Sodium 143   Recent Lipid Panel    Component Value Date/Time   CHOL 210 (H) 02/03/2019 0924   TRIG 262 (H) 02/03/2019 0924   HDL 36 (L) 02/03/2019 0924   CHOLHDL 5.8 (H) 02/03/2019 0924   LDLCALC 127 (H) 02/03/2019 6962    Physical Exam:   VS:  LMP  (LMP Unknown)    Wt Readings from Last 3 Encounters:  02/24/19 126 lb (57.2 kg)  02/17/19 126 lb 9.6 oz (57.4 kg)  11/05/18 128 lb (58.1 kg)    General: Well nourished, well developed, in no acute distress Heart: Atraumatic, normal size  Eyes: PEERLA,  EOMI  Neck: Supple, no JVD Endocrine: No thryomegaly Cardiac: Normal S1, S2; RRR; no murmurs, rubs, or gallops Lungs: Clear to auscultation bilaterally, no wheezing, rhonchi or rales  Abd: Soft, nontender, no hepatomegaly  Ext: No edema, pulses 2+ Musculoskeletal: No deformities, BUE and BLE strength normal and equal Skin: Warm and dry, no rashes   Neuro: Alert and oriented to person, place, time, and situation, CNII-XII grossly intact, no focal deficits  Psych: Normal mood and affect   ASSESSMENT:   Tammy Osborne is a 64 y.o. female who presents for the following: No diagnosis found.  PLAN:   There are no diagnoses linked to this encounter.  Disposition: No follow-ups on file.  Medication Adjustments/Labs and Tests Ordered: Current medicines are reviewed at length with the patient today.  Concerns regarding medicines are outlined above.  No orders of the defined types were placed in this encounter.  No orders of the defined types were placed in this encounter.   There are no Patient Instructions on file for this visit.   Time Spent with Patient: I have spent a total of *** minutes with patient reviewing hospital notes, telemetry, EKGs, labs and examining the patient as well as establishing an assessment and plan that was discussed with the patient.  > 50% of time was spent in direct patient care.  Signed, Lenna Gilford. Flora Lipps, MD [facility]  [address], [address] [place], [place] [address] [phone]  05/06/2019 5:32 PM

## 2019-05-07 ENCOUNTER — Ambulatory Visit: Payer: Self-pay | Admitting: Cardiovascular Disease

## 2019-05-18 ENCOUNTER — Ambulatory Visit: Payer: Self-pay | Admitting: Internal Medicine

## 2019-05-22 ENCOUNTER — Ambulatory Visit: Payer: Self-pay | Admitting: Internal Medicine

## 2019-05-25 ENCOUNTER — Other Ambulatory Visit: Payer: Self-pay

## 2019-05-25 ENCOUNTER — Encounter: Payer: Self-pay | Admitting: Internal Medicine

## 2019-05-25 ENCOUNTER — Ambulatory Visit: Payer: Self-pay | Attending: Internal Medicine | Admitting: Internal Medicine

## 2019-05-25 VITALS — BP 163/81 | HR 79 | Temp 97.7°F | Resp 16 | Wt 124.0 lb

## 2019-05-25 DIAGNOSIS — Z23 Encounter for immunization: Secondary | ICD-10-CM

## 2019-05-25 DIAGNOSIS — I10 Essential (primary) hypertension: Secondary | ICD-10-CM

## 2019-05-25 DIAGNOSIS — J849 Interstitial pulmonary disease, unspecified: Secondary | ICD-10-CM

## 2019-05-25 DIAGNOSIS — E1142 Type 2 diabetes mellitus with diabetic polyneuropathy: Secondary | ICD-10-CM

## 2019-05-25 DIAGNOSIS — L299 Pruritus, unspecified: Secondary | ICD-10-CM

## 2019-05-25 LAB — GLUCOSE, POCT (MANUAL RESULT ENTRY): POC Glucose: 99 mg/dl (ref 70–99)

## 2019-05-25 MED ORDER — CARVEDILOL 12.5 MG PO TABS: 12.5000 mg | ORAL_TABLET | Freq: Two times a day (BID) | ORAL | 4 refills | Status: DC

## 2019-05-25 MED ORDER — GABAPENTIN 100 MG PO CAPS: 200.0000 mg | ORAL_CAPSULE | Freq: Every day | ORAL | 3 refills | Status: DC

## 2019-05-25 MED ORDER — BUDESONIDE-FORMOTEROL FUMARATE 80-4.5 MCG/ACT IN AERO: 2.0000 | INHALATION_SPRAY | Freq: Two times a day (BID) | RESPIRATORY_TRACT | 3 refills | Status: DC

## 2019-05-25 NOTE — Progress Notes (Signed)
Pt states she was told she has asthma  Pt states she has a cough, chest congestion, she is tired all the time

## 2019-05-25 NOTE — Progress Notes (Signed)
Patient ID: Tammy Osborne, female    DOB: 03-May-1955  MRN: [id number]  CC: Diabetes and Hypertension   Subjective: Tammy Osborne is a 64 y.o. female who presents for chronic ds management.  Maren Reamer interprets. Her concerns today include:  Patient with history ofHTN, DM2,probableILD, HL, anemia  ILD: SOB x cough x 3 wks ago which she attributes to being out of her usual inhaler. Given samples of Asmanex by pulmonary.  This works well for her however, when she ran out of the samples she could not afford the medication which cost over $200. Out x 3 mths.   No fever  DIABETES TYPE 2 Last A1C:   Results for orders placed or performed in visit on 05/25/19  POCT glucose (manual entry)  Result Value Ref Range   POC Glucose 99 70 - 99 mg/dl    Lab Results  Component Value Date   HGBA1C 6.0 (H) 02/03/2019    Med Adherence:  [x]  Yes    []  No Medication side effects:  []  Yes    []  No Home Monitoring?  [x]  Yes  But not every day about every 2 wks Home glucose results range: 99-120 Diet Adherence: poor appetite x 1 mth Exercise: [x]  Yes    []  No Hypoglycemic episodes?: []  Yes    [x]  No Numbness of the feet? [x]  Yes which has been going on for months and has gotten progressively worse.   []  No Retinopathy hx? []  Yes    []  No Last eye exam:  Wears glasses, unable to afford eye exam Comments:   Itching and burning sensation from knees down x 3 mths Not using anything except rubbing  Alcohol Uses Aveeno Lotion for dry skin  HTN: reports compliant with Norvasc and Coreg No device to check BP No chest pains or lower extremity edema.  No headaches or dizziness. Marland Kitchen  HM:  Did not get COVID vaccine as yet.  Patient Active Problem List   Diagnosis Date Noted  . Gastroesophageal reflux disease without esophagitis 04/04/2018  . Chest pain 04/04/2018  . Controlled type 2 diabetes mellitus with complication, without long-term current use of insulin ([place]) 10/19/2016   . Essential hypertension 09/06/2016  . Interstitial pulmonary disease ([place]) 09/06/2016  . Cough in adult 09/06/2016  . Anemia 09/06/2016  . Hyperlipidemia 09/06/2016  . Numbness of lower limb 09/06/2016     Current Outpatient Medications on File Prior to Visit  Medication Sig Dispense Refill  . amLODipine (NORVASC) 10 MG tablet Take 1 tablet (10 mg total) by mouth daily. 30 tablet 6  . aspirin EC 81 MG tablet Take 1 tablet (81 mg total) by mouth daily. 100 tablet 1  . atorvastatin (LIPITOR) 40 MG tablet Take 1 tablet (40 mg total) by mouth daily. 30 tablet 6  . diclofenac sodium (VOLTAREN) 1 % GEL Apply 2 g topically 4 (four) times daily. 100 g 1  . metFORMIN (GLUCOPHAGE) 500 MG tablet Take 1 tablet (500 mg total) by mouth daily with breakfast. 90 tablet 3  . omeprazole (PRILOSEC) 20 MG capsule Take 1 capsule (20 mg total) by mouth daily. 30 capsule 6   No current facility-administered medications on file prior to visit.    No Known Allergies  Social History   Socioeconomic History  . Marital status: Single    Spouse name: Not on file  . Number of children: 2  . Years of education: Not on file  . Highest education level: Not  on file  Occupational History  . Not on file  Tobacco Use  . Smoking status: Passive Smoke Exposure - Never Smoker  . Smokeless tobacco: Never Used  . Tobacco comment: spouse smoked in home X6-7 years.   Substance and Sexual Activity  . Alcohol use: No  . Drug use: Not on file  . Sexual activity: Not on file  Other Topics Concern  . Not on file  Social History Narrative  . Not on file   Social Determinants of Health   Financial Resource Strain:   . Difficulty of Paying Living Expenses:   Food Insecurity:   . Worried About Programme researcher, broadcasting/film/video in the Last Year:   . Barista in the Last Year:   Transportation Needs:   . Freight forwarder (Medical):   Marland Kitchen Lack of Transportation (Non-Medical):   Physical Activity:   . Days of  Exercise per Week:   . Minutes of Exercise per Session:   Stress:   . Feeling of Stress :   Social Connections:   . Frequency of Communication with Friends and Family:   . Frequency of Social Gatherings with Friends and Family:   . Attends Religious Services:   . Active Member of Clubs or Organizations:   . Attends Banker Meetings:   Marland Kitchen Marital Status:   Intimate Partner Violence:   . Fear of Current or Ex-Partner:   . Emotionally Abused:   Marland Kitchen Physically Abused:   . Sexually Abused:     Family History  Problem Relation Age of Onset  . Hypertension Mother     Past Surgical History:  Procedure Laterality Date  . ABDOMINAL HYSTERECTOMY      ROS: Review of Systems Negative except as stated above  PHYSICAL EXAM: BP (!) 163/81   Pulse 79   Temp 97.7 F (36.5 C)   Resp 16   Wt 124 lb (56.2 kg)   LMP  (LMP Unknown)   SpO2 93%   BMI 25.92 kg/m   Physical Exam  General appearance - alert, well appearing, and in no distress Mental status - normal mood, behavior, speech, dress, motor activity, and thought processes Mouth - mucous membranes moist, pharynx normal without lesions Neck - supple, no significant adenopathy Chest -breath sounds decreased bilaterally with some popping sounds heard at the bases. Heart - normal rate, regular rhythm, normal S1, S2, no murmurs, rubs, clicks or gallops Extremities - peripheral pulses normal, no pedal edema, no clubbing or cyanosis Diabetic Foot Exam - Simple   Simple Foot Form Visual Inspection No deformities, no ulcerations, no other skin breakdown bilaterally: Yes Sensation Testing See comments: Yes Pulse Check See comments: Yes Comments She has decreased sensation on leap exam on the plantar surface of both feet.  Dorsalis pedis pulses 2+ bilaterally.      CMP Latest Ref Rng & Units 02/03/2019 08/15/2017 09/06/2016  Glucose 65 - 99 mg/dL 96 767(H) 90  BUN 8 - 27 mg/dL 10 20 22   Creatinine 0.57 - 1.00 mg/dL  4.19 3.79  Sodium 134 - 144 mmol/L 143 142 143  Potassium 3.5 - 5.2 mmol/L 4.4 3.5 4.6  Chloride 96 - 106 mmol/L 104 107 105  CO2 20 - 29 mmol/L 24 21(L) 22  Calcium 8.7 - 10.3 mg/dL 0.24 9.4 09.7  Total Protein 6.0 - 8.5 g/dL 7.2 8.1 7.4  Total Bilirubin 0.0 - 1.2 mg/dL 0.3 35.3) 0.2  Alkaline Phos 39 - 117 IU/L 184(H) 126 143(H)  AST 0 - 40 IU/L 18 40 23  ALT 0 - 32 IU/L 17 44 18   Lipid Panel     Component Value Date/Time   CHOL 210 (H) 02/03/2019 0924   TRIG 262 (H) 02/03/2019 0924   HDL 36 (L) 02/03/2019 0924   CHOLHDL 5.8 (H) 02/03/2019 0924   LDLCALC 127 (H) 02/03/2019 0924    CBC    Component Value Date/Time   WBC 9.0 02/03/2019 0924   WBC 29.8 (H) 08/15/2017 0611   RBC 5.49 (H) 02/03/2019 0924   RBC 5.40 (H) 08/15/2017 0611   HGB 14.4 02/03/2019 0924   HCT 44.0 02/03/2019 0924   PLT 423 02/03/2019 0924   MCV 80 02/03/2019 0924   MCH 26.2 (L) 02/03/2019 0924   MCH 27.0 08/15/2017 0611   MCHC 32.7 02/03/2019 0924   MCHC 32.8 08/15/2017 0611   RDW 14.4 02/03/2019 0924   LYMPHSABS 4.3 (H) 09/19/2017 1208   MONOABS 0.9 10/15/2016 1553   EOSABS 0.0 09/19/2017 1208   BASOSABS 0.0 09/19/2017 1208    ASSESSMENT AND PLAN:  1. Controlled type 2 diabetes mellitus with diabetic polyneuropathy, without long-term current use of insulin (HCC) Continue Metformin Encouraged to continue healthy eating habits and regular exercise.  She is agreeable to trying a low-dose of gabapentin to help decrease the numbness in her feet. Encouraged her to get eye exam when she can afford. - POCT glucose (manual entry) - gabapentin (NEURONTIN) 100 MG capsule; Take 2 capsules (200 mg total) by mouth at bedtime.  Dispense: 60 capsule; Refill: 3  2. Essential hypertension Not at goal.  Increase carvedilol to 12.5 mg twice a day.  Continue amlodipine.  Not on ACE inhibitor due to cough - carvedilol (COREG) 12.5 MG tablet; Take 1 tablet (12.5 mg total) by mouth 2 (two) times daily with a  meal.  Dispense: 60 tablet; Refill: 4  3. ILD (interstitial lung disease) (HCC) We will put her on Symbicort instead which she can get through her pharmacy. - budesonide-formoterol (SYMBICORT) 80-4.5 MCG/ACT inhaler; Inhale 2 puffs into the lungs 2 (two) times daily.  Dispense: 1 Inhaler; Refill: 3  4. Itching Recommend over-the-counter hydrocortisone cream as needed  5. High priority for COVID-19 virus vaccination Strongly encouraged her to get the COVID-19 vaccine given her chronic lung disease.  She is agreeable to doing so.    Patient was given the opportunity to ask questions.  Patient verbalized understanding of the plan and was able to repeat key elements of the plan.   Orders Placed This Encounter  Procedures  . POCT glucose (manual entry)     Requested Prescriptions   Signed Prescriptions Disp Refills  . carvedilol (COREG) 12.5 MG tablet 60 tablet 4    Sig: Take 1 tablet (12.5 mg total) by mouth 2 (two) times daily with a meal.  . gabapentin (NEURONTIN) 100 MG capsule 60 capsule 3    Sig: Take 2 capsules (200 mg total) by mouth at bedtime.  . budesonide-formoterol (SYMBICORT) 80-4.5 MCG/ACT inhaler 1 Inhaler 3    Sig: Inhale 2 puffs into the lungs 2 (two) times daily.    Return in about 4 months (around 09/24/2019).  Jonah Blue, MD, FACP

## 2019-09-21 ENCOUNTER — Ambulatory Visit: Payer: Self-pay | Attending: Internal Medicine | Admitting: Internal Medicine

## 2019-09-21 ENCOUNTER — Encounter: Payer: Self-pay | Admitting: Internal Medicine

## 2019-09-21 ENCOUNTER — Other Ambulatory Visit: Payer: Self-pay | Admitting: Internal Medicine

## 2019-09-21 VITALS — BP 170/65 | HR 94 | Temp 98.3°F | Resp 16 | Wt 124.4 lb

## 2019-09-21 DIAGNOSIS — Z1159 Encounter for screening for other viral diseases: Secondary | ICD-10-CM

## 2019-09-21 DIAGNOSIS — E1169 Type 2 diabetes mellitus with other specified complication: Secondary | ICD-10-CM

## 2019-09-21 DIAGNOSIS — K219 Gastro-esophageal reflux disease without esophagitis: Secondary | ICD-10-CM

## 2019-09-21 DIAGNOSIS — Z1231 Encounter for screening mammogram for malignant neoplasm of breast: Secondary | ICD-10-CM

## 2019-09-21 DIAGNOSIS — R05 Cough: Secondary | ICD-10-CM

## 2019-09-21 DIAGNOSIS — E785 Hyperlipidemia, unspecified: Secondary | ICD-10-CM

## 2019-09-21 DIAGNOSIS — R059 Cough, unspecified: Secondary | ICD-10-CM

## 2019-09-21 DIAGNOSIS — Z23 Encounter for immunization: Secondary | ICD-10-CM

## 2019-09-21 DIAGNOSIS — Z1211 Encounter for screening for malignant neoplasm of colon: Secondary | ICD-10-CM

## 2019-09-21 DIAGNOSIS — I10 Essential (primary) hypertension: Secondary | ICD-10-CM

## 2019-09-21 DIAGNOSIS — I700 Atherosclerosis of aorta: Secondary | ICD-10-CM

## 2019-09-21 DIAGNOSIS — R0602 Shortness of breath: Secondary | ICD-10-CM

## 2019-09-21 DIAGNOSIS — E1142 Type 2 diabetes mellitus with diabetic polyneuropathy: Secondary | ICD-10-CM

## 2019-09-21 DIAGNOSIS — J849 Interstitial pulmonary disease, unspecified: Secondary | ICD-10-CM

## 2019-09-21 LAB — GLUCOSE, POCT (MANUAL RESULT ENTRY): POC Glucose: 100 mg/dl — AB (ref 70–99)

## 2019-09-21 LAB — POCT GLYCOSYLATED HEMOGLOBIN (HGB A1C): HbA1c, POC (prediabetic range): 6 % (ref 5.7–6.4)

## 2019-09-21 MED ORDER — PREDNISONE 20 MG PO TABS: 20.0000 mg | ORAL_TABLET | Freq: Every day | ORAL | 0 refills | Status: DC

## 2019-09-21 MED ORDER — AMLODIPINE BESYLATE 10 MG PO TABS: 10.0000 mg | ORAL_TABLET | Freq: Every day | ORAL | 6 refills | Status: DC

## 2019-09-21 MED ORDER — CARVEDILOL 12.5 MG PO TABS: 12.5000 mg | ORAL_TABLET | Freq: Two times a day (BID) | ORAL | 4 refills | Status: DC

## 2019-09-21 MED ORDER — ATORVASTATIN CALCIUM 40 MG PO TABS: 40.0000 mg | ORAL_TABLET | Freq: Every day | ORAL | 6 refills | Status: DC

## 2019-09-21 MED ORDER — METFORMIN HCL 500 MG PO TABS: 500.0000 mg | ORAL_TABLET | Freq: Every day | ORAL | 3 refills | Status: DC

## 2019-09-21 MED ORDER — BUDESONIDE-FORMOTEROL FUMARATE 80-4.5 MCG/ACT IN AERO
2.0000 | INHALATION_SPRAY | Freq: Two times a day (BID) | RESPIRATORY_TRACT | 12 refills | Status: DC
  Filled 2020-06-08: qty 10.2, 30d supply, fill #0
  Filled 2020-07-06: qty 10.2, 30d supply, fill #1
  Filled 2020-07-06: qty 20.4, 60d supply, fill #1

## 2019-09-21 MED ORDER — LOSARTAN POTASSIUM 25 MG PO TABS: 25.0000 mg | ORAL_TABLET | Freq: Every day | ORAL | 6 refills | Status: DC

## 2019-09-21 MED ORDER — OMEPRAZOLE 20 MG PO CPDR
20.0000 mg | DELAYED_RELEASE_CAPSULE | Freq: Every day | ORAL | 6 refills | Status: DC
  Filled 2020-05-05 (×2): qty 30, 30d supply, fill #0

## 2019-09-21 NOTE — Progress Notes (Signed)
Patient ID: Tammy Osborne, female    DOB: 10/07/1955  MRN: [id number]  CC: Diabetes and Hypertension   Subjective: Tammy Osborne is a 64 y.o. female who presents for chronic ds management Her concerns today include:  Patient with history ofHTN, DM2,probableILD, HL, anemia  Pt c/o very bad dry cough like I have dust in my throat x 1 mth.  Also coughs when she tries to talk. + SOB.  No rhinorrhea.  No drainage at back of throat. Some itchy throat.  No burning in stomach or throat.  Taking Omeprazole -no fever. -using Symbicort PRN only when she has wheezing.  She thought it was PRN  DIABETES TYPE 2 Last A1C:   Results for orders placed or performed in visit on 09/21/19  POCT glucose (manual entry)  Result Value Ref Range   POC Glucose 100 (A) 70 - 99 mg/dl  POCT glycosylated hemoglobin (Hb A1C)  Result Value Ref Range   Hemoglobin A1C     HbA1c POC (<> result, manual entry)     HbA1c, POC (prediabetic range) 6.0 5.7 - 6.4 %   HbA1c, POC (controlled diabetic range)      Med Adherence:  [x]  Yes    []  No Medication side effects:  []  Yes    [x]  No Home Monitoring?  []  Yes    [x]  No Home glucose results range: Diet Adherence: [x]  Yes    []  No Exercise: [x]  Yes -she walks 25 mins 2-3 x  wk Hypoglycemic episodes?: []  Yes    [x]  No Numbness of the feet? []  Yes    [x]  No Retinopathy hx? []  Yes    []  No Last eye exam: over due for eye exam.  No insurance Comments:   HYPERTENSION Currently taking: see medication list.  Took already for the day Med Adherence: [x]  Yes -on Coreg and Norvasc Medication side effects: [x]  Yes    []  No Adherence with salt restriction: [x]  Yes    []  No Home Monitoring?: [x]  Yes    []  No Monitoring Frequency: 2-3 Home BP results range: 140s/70-80 SOB? [x]  Yes    []  No Chest Pain?: []  Yes    [x]  No Leg swelling?: []  Yes    [x]  No Headaches?: []  Yes    [x]  No Dizziness? []  Yes    [x]  No Comments:   HL:  Reports compliance with  Atorvastatin  HM:  Completed COVID vaccine  Patient Active Problem List   Diagnosis Date Noted  . Gastroesophageal reflux disease without esophagitis 04/04/2018  . Chest pain 04/04/2018  . Controlled type 2 diabetes mellitus with diabetic polyneuropathy, without long-term current use of insulin (HCC) 10/19/2016  . Essential hypertension 09/06/2016  . Interstitial pulmonary disease (HCC) 09/06/2016  . Cough in adult 09/06/2016  . Anemia 09/06/2016  . Hyperlipidemia 09/06/2016  . Numbness of lower limb 09/06/2016     Current Outpatient Medications on File Prior to Visit  Medication Sig Dispense Refill  . aspirin EC 81 MG tablet Take 1 tablet (81 mg total) by mouth daily. 100 tablet 1  . diclofenac sodium (VOLTAREN) 1 % GEL Apply 2 g topically 4 (four) times daily. 100 g 1  . gabapentin (NEURONTIN) 100 MG capsule Take 2 capsules (200 mg total) by mouth at bedtime. 60 capsule 3   No current facility-administered medications on file prior to visit.    No Known Allergies  Social History   Socioeconomic History  . Marital status: Single  Spouse name: Not on file  . Number of children: 2  . Years of education: Not on file  . Highest education level: Not on file  Occupational History  . Not on file  Tobacco Use  . Smoking status: Passive Smoke Exposure - Never Smoker  . Smokeless tobacco: Never Used  . Tobacco comment: spouse smoked in home X6-7 years.   Vaping Use  . Vaping Use: Never used  Substance and Sexual Activity  . Alcohol use: No  . Drug use: Not on file  . Sexual activity: Not on file  Other Topics Concern  . Not on file  Social History Narrative  . Not on file   Social Determinants of Health   Financial Resource Strain:   . Difficulty of Paying Living Expenses: Not on file  Food Insecurity:   . Worried About Programme researcher, broadcasting/film/video in the Last Year: Not on file  . Ran Out of Food in the Last Year: Not on file  Transportation Needs:   . Lack of  Transportation (Medical): Not on file  . Lack of Transportation (Non-Medical): Not on file  Physical Activity:   . Days of Exercise per Week: Not on file  . Minutes of Exercise per Session: Not on file  Stress:   . Feeling of Stress : Not on file  Social Connections:   . Frequency of Communication with Friends and Family: Not on file  . Frequency of Social Gatherings with Friends and Family: Not on file  . Attends Religious Services: Not on file  . Active Member of Clubs or Organizations: Not on file  . Attends Banker Meetings: Not on file  . Marital Status: Not on file  Intimate Partner Violence:   . Fear of Current or Ex-Partner: Not on file  . Emotionally Abused: Not on file  . Physically Abused: Not on file  . Sexually Abused: Not on file    Family History  Problem Relation Age of Onset  . Hypertension Mother     Past Surgical History:  Procedure Laterality Date  . ABDOMINAL HYSTERECTOMY      ROS: Review of Systems Negative except as stated above  PHYSICAL EXAM: BP (!) 170/65   Pulse 94   Temp 98.3 F (36.8 C)   Resp 16   Wt 124 lb 6.4 oz (56.4 kg)   LMP  (LMP Unknown)   SpO2 95%   BMI 26.00 kg/m   Physical Exam  General appearance - alert, well appearing, and in no distress Mental status - normal mood, behavior, speech, dress, motor activity, and thought processes Nose - normal and patent, no erythema, discharge or polyps Mouth - mucous membranes moist, pharynx normal without lesions Neck - supple, no significant adenopathy Chest -breath sounds decreased bilaterally with Velcro and popping sounds BL.  Pt able to speak in full sentences.  No dyspnea.  Some dry coughing in my presence Heart - normal rate, regular rhythm, normal S1, S2, no murmurs, rubs, clicks or gallops Extremities - no LE edema.  + mild clubbing  CMP Latest Ref Rng & Units 02/03/2019 08/15/2017 09/06/2016  Glucose 65 - 99 mg/dL 96 622(W) 90  BUN 8 - 27 mg/dL 10 20 22    Creatinine 0.57 - 1.00 mg/dL 9.79 8.92  Sodium 134 - 144 mmol/L 143 142 143  Potassium 3.5 - 5.2 mmol/L 4.4 3.5 4.6  Chloride 96 - 106 mmol/L 104 107 105  CO2 20 - 29 mmol/L 24 21(L) 22  Calcium 8.7 - 10.3 mg/dL 31.5 9.4 17.6  Total Protein 6.0 - 8.5 g/dL 7.2 8.1 7.4  Total Bilirubin 0.0 - 1.2 mg/dL 0.3 1.6(W) 0.2  Alkaline Phos 39 - 117 IU/L 184(H) 126 143(H)  AST 0 - 40 IU/L 18 40 23  ALT 0 - 32 IU/L 17 44 18   Lipid Panel     Component Value Date/Time   CHOL 210 (H) 02/03/2019 0924   TRIG 262 (H) 02/03/2019 0924   HDL 36 (L) 02/03/2019 0924   CHOLHDL 5.8 (H) 02/03/2019 0924   LDLCALC 127 (H) 02/03/2019 0924    CBC    Component Value Date/Time   WBC 9.0 02/03/2019 0924   WBC 29.8 (H) 08/15/2017 0611   RBC 5.49 (H) 02/03/2019 0924   RBC 5.40 (H) 08/15/2017 0611   HGB 14.4 02/03/2019 0924   HCT 44.0 02/03/2019 0924   PLT 423 02/03/2019 0924   MCV 80 02/03/2019 0924   MCH 26.2 (L) 02/03/2019 0924   MCH 27.0 08/15/2017 0611   MCHC 32.7 02/03/2019 0924   MCHC 32.8 08/15/2017 0611   RDW 14.4 02/03/2019 0924   LYMPHSABS 4.3 (H) 09/19/2017 1208   MONOABS 0.9 10/15/2016 1553   EOSABS 0.0 09/19/2017 1208   BASOSABS 0.0 09/19/2017 1208    ASSESSMENT AND PLAN: 1. Controlled type 2 diabetes mellitus with diabetic polyneuropathy, without long-term current use of insulin (HCC) At goal.  Continue healthy eating habits, Metformin and regular exercise - POCT glucose (manual entry) - POCT glycosylated hemoglobin (Hb A1C) - metFORMIN (GLUCOPHAGE) 500 MG tablet; Take 1 tablet (500 mg total) by mouth daily with breakfast.  Dispense: 90 tablet; Refill: 3  2. Essential hypertension Not at goal. Pt reports compliance with Norvasc and Coreg but pulse rate does not reflect that.  However, we have to take her at her word. Add Cozaar. F/u with clinical pharmacist in 2 wks for recheck BP - losartan (COZAAR) 25 MG tablet; Take 1 tablet (25 mg total) by mouth daily.  Dispense: 30  tablet; Refill: 6 - carvedilol (COREG) 12.5 MG tablet; Take 1 tablet (12.5 mg total) by mouth 2 (two) times daily with a meal.  Dispense: 60 tablet; Refill: 4 - amLODipine (NORVASC) 10 MG tablet; Take 1 tablet (10 mg total) by mouth daily.  Dispense: 30 tablet; Refill: 6  3. ILD (interstitial lung disease) (HCC) 4. Cough 5. Shortness of breath -likely due to flare or worsening of ILD. Pt was not using the Symbicort BID as prescribed.  Advise pt that Symbicort is not PRN inhaler, recommend BID use Given short course Prednisone Keep appt with pulmonary next mth - budesonide-formoterol (SYMBICORT) 80-4.5 MCG/ACT inhaler; Inhale 2 puffs into the lungs 2 (two) times daily.  Dispense: 1 each; Refill: 12 - predniSONE (DELTASONE) 20 MG tablet; Take 1 tablet (20 mg total) by mouth daily with breakfast.  Dispense: 5 tablet; Refill: 0 - DG Chest 2 View; Future - CBC With Differential - Novel Coronavirus, NAA ([facility]) - Brain natriuretic peptide  6. Hyperlipidemia associated with type 2 diabetes mellitus (HCC) Not at goal on last lipid profile 01/2019 - atorvastatin (LIPITOR) 40 MG tablet; Take 1 tablet (40 mg total) by mouth daily.  Dispense: 30 tablet; Refill: 6 - Lipid panel  7. Gastroesophageal reflux disease without esophagitis - omeprazole (PRILOSEC) 20 MG capsule; Take 1 capsule (20 mg total) by mouth daily.  Dispense: 30 capsule; Refill: 6  8. Aortic atherosclerosis (HCC) -as seen on chest CT 09/2018.  Continue Lipitor and  ASA  9. Screening for colon cancer - Fecal occult blood, imunochemical([facility])  10. Encounter for screening mammogram for malignant neoplasm of breast - MM Digital Screening; Future  11. Need for hepatitis C screening test - Hepatitis C Antibody  12. Need for influenza vaccination Patient wants to hold off until she gets the results of the Covid test.  I think this is reasonable.     Patient was given the opportunity to ask questions.  Patient  verbalized understanding of the plan and was able to repeat key elements of the plan.  [employer] interpreter used during this encounter.#[id number]   Orders Placed This Encounter  Procedures  . Fecal occult blood, imunochemical([facility])  . Novel Coronavirus, NAA ([facility])  . MM Digital Screening  . DG Chest 2 View  . Lipid panel  . CBC With Differential  . Brain natriuretic peptide  . Hepatitis C Antibody  . POCT glucose (manual entry)  . POCT glycosylated hemoglobin (Hb A1C)     Requested Prescriptions   Signed Prescriptions Disp Refills  . budesonide-formoterol (SYMBICORT) 80-4.5 MCG/ACT inhaler 1 each 12    Sig: Inhale 2 puffs into the lungs 2 (two) times daily.  Marland Kitchen omeprazole (PRILOSEC) 20 MG capsule 30 capsule 6    Sig: Take 1 capsule (20 mg total) by mouth daily.  Marland Kitchen atorvastatin (LIPITOR) 40 MG tablet 30 tablet 6    Sig: Take 1 tablet (40 mg total) by mouth daily.  . predniSONE (DELTASONE) 20 MG tablet 5 tablet 0    Sig: Take 1 tablet (20 mg total) by mouth daily with breakfast.  . losartan (COZAAR) 25 MG tablet 30 tablet 6    Sig: Take 1 tablet (25 mg total) by mouth daily.  . metFORMIN (GLUCOPHAGE) 500 MG tablet 90 tablet 3    Sig: Take 1 tablet (500 mg total) by mouth daily with breakfast.  . carvedilol (COREG) 12.5 MG tablet 60 tablet 4    Sig: Take 1 tablet (12.5 mg total) by mouth 2 (two) times daily with a meal.  . amLODipine (NORVASC) 10 MG tablet 30 tablet 6    Sig: Take 1 tablet (10 mg total) by mouth daily.    Return in about 4 months (around 01/21/2020) for [facility] in 2 weeks for repeat BP check.  Jonah Blue, MD, FACP

## 2019-09-22 LAB — CBC WITH DIFFERENTIAL
Basophils Absolute: 0.1 10*3/uL (ref 0.0–0.2)
Basos: 1 %
EOS (ABSOLUTE): 0 10*3/uL (ref 0.0–0.4)
Eos: 0 %
Hematocrit: 46.5 % (ref 34.0–46.6)
Hemoglobin: 15.5 g/dL (ref 11.1–15.9)
Immature Grans (Abs): 0.1 10*3/uL (ref 0.0–0.1)
Immature Granulocytes: 1 %
Lymphocytes Absolute: 5 10*3/uL — ABNORMAL HIGH (ref 0.7–3.1)
Lymphs: 37 %
MCH: 26.8 pg (ref 26.6–33.0)
MCHC: 33.3 g/dL (ref 31.5–35.7)
MCV: 80 fL (ref 79–97)
Monocytes Absolute: 0.9 10*3/uL (ref 0.1–0.9)
Monocytes: 6 %
Neutrophils Absolute: 7.6 10*3/uL — ABNORMAL HIGH (ref 1.4–7.0)
Neutrophils: 55 %
RBC: 5.79 x10E6/uL — ABNORMAL HIGH (ref 3.77–5.28)
RDW: 17.2 % — ABNORMAL HIGH (ref 11.7–15.4)
WBC: 13.7 10*3/uL — ABNORMAL HIGH (ref 3.4–10.8)

## 2019-09-22 LAB — LIPID PANEL
Chol/HDL Ratio: 7.6 ratio — ABNORMAL HIGH (ref 0.0–4.4)
Cholesterol, Total: 275 mg/dL — ABNORMAL HIGH (ref 100–199)
HDL: 36 mg/dL — ABNORMAL LOW (ref >39)
LDL Chol Calc (NIH): 102 mg/dL — ABNORMAL HIGH (ref 0–99)
Triglycerides: 787 mg/dL (ref 0–149)
VLDL Cholesterol Cal: 137 mg/dL — ABNORMAL HIGH (ref 5–40)

## 2019-09-22 LAB — BRAIN NATRIURETIC PEPTIDE: BNP: 21.3 pg/mL (ref 0.0–100.0)

## 2019-09-22 LAB — HEPATITIS C ANTIBODY: Hep C Virus Ab: 0.1 s/co ratio (ref 0.0–0.9)

## 2019-09-23 LAB — NOVEL CORONAVIRUS, NAA: SARS-CoV-2, NAA: NOT DETECTED

## 2019-09-23 LAB — SARS-COV-2, NAA 2 DAY TAT

## 2019-09-23 NOTE — Progress Notes (Signed)
Let patient know that she has mild elevation in her white blood cell count.  I need her to go and get the chest x-ray done as soon as possible as was discussed.  Cholesterol level very elevated.  Her triglyceride level is 787 with goal being less than 150.  Please confirm whether or not she was taking the atorvastatin consistently as she indicated on recent visit.  If she has been taking it consistently then I will increase the dose from 40 mg daily to 60 mg daily.  Screening test for hepatitis C was negative.  Covid test was negative.  Please arrange for her to come back to see Franky Macho to get her flu vaccine.  Is she feeling better on the prednisone that I gave her for the cough?

## 2019-09-27 ENCOUNTER — Telehealth: Payer: Self-pay

## 2019-09-27 DIAGNOSIS — E1169 Type 2 diabetes mellitus with other specified complication: Secondary | ICD-10-CM

## 2019-09-27 MED ORDER — ATORVASTATIN CALCIUM 40 MG PO TABS: 60.0000 mg | ORAL_TABLET | Freq: Every day | ORAL | 6 refills | Status: DC

## 2019-09-27 NOTE — Telephone Encounter (Signed)
Lipitor increased to 60 mg daily.

## 2019-09-27 NOTE — Addendum Note (Signed)
Addended by: Jonah Blue B on: 09/27/2019 02:56 PM   Modules accepted: Orders

## 2019-09-27 NOTE — Telephone Encounter (Signed)
[employer] Marcela  Id# [id number]  contacted pt to go over lab results pt is aware and doesn't have any questions or concerns    Pt states the prednisone did help with cough. Pt states she is staking the liptor ever day. Pt is aware that pcp will be sending in new rx for liptor to the pharmacy. Pt is going to the hospital in the morning to get xrays done

## 2019-09-28 ENCOUNTER — Other Ambulatory Visit: Payer: Self-pay

## 2019-09-28 ENCOUNTER — Ambulatory Visit (HOSPITAL_COMMUNITY)
Admission: RE | Admit: 2019-09-28 | Discharge: 2019-09-28 | Disposition: A | Discharge status: Home / Self Care | Payer: Self-pay | Source: Ambulatory Visit | Attending: Internal Medicine | Admitting: Internal Medicine

## 2019-09-28 ENCOUNTER — Ambulatory Visit: Payer: Self-pay | Admitting: Pharmacist

## 2019-09-28 DIAGNOSIS — R05 Cough: Secondary | ICD-10-CM | POA: Insufficient documentation

## 2019-09-28 DIAGNOSIS — R059 Cough, unspecified: Secondary | ICD-10-CM

## 2019-09-29 ENCOUNTER — Telehealth: Payer: Self-pay

## 2019-09-29 NOTE — Telephone Encounter (Signed)
[employer] Wynona Canes  Id# [id number]  contacted pt to go overxray results pt didn't answer lvm asking pt to give a call back

## 2019-10-08 NOTE — Progress Notes (Signed)
Cardiology Office Note:   Date:  10/09/2019  NAME:  Tammy Osborne    MRN: [id number] DOB:  07-Dec-1955   PCP:  Marcine Matar, MD  Cardiologist:  No primary care provider on file.    Referring MD: Marcine Matar, MD   Chief Complaint  Patient presents with  . Follow-up   History of Present Illness:   Tammy Osborne is a 64 y.o. female with a hx of HTN, HLD, DM, CAD who presents for follow-up. Evaluated 01/2019 for CP. Has coronary calcium seen on CT scan. Normal MPI study. Need to get LDL/TG under control.  She reports she still getting episodes of chest pain.  This mainly occurs after she eats food.  Associated symptoms include cough.  She does have acid reflux and needs to try different medication.  Her most recent lipid profile shows a total cholesterol 275, HDL 36, LDL 102, triglycerides 787.  She reports she was not fasting at that visit.  She reports she is not exercising but does not get any chest pain when she exercises.  She reports she does not drink alcohol and watches her fatty food intake.  Her most recent A1c was 6.0.  She does not take any specific medications for her high triglycerides.  She takes Lipitor 60 mg daily.  Clearly we need to get her triglycerides under control.  She is at risk for pancreatitis.  Her blood pressure is well controlled today.  Problem List 1. Diabetes -A1c 6.0 2. Hypertension  3. Hyperlipidemia  -T chol 275, HDL 36, LDL 102, TG 787 4. Interstitial lung disease  5. CAD -CAC seen on CT scan   Past Medical History: Past Medical History:  Diagnosis Date  . Diabetes mellitus without complication (HCC)   . Hypertension     Past Surgical History: Past Surgical History:  Procedure Laterality Date  . ABDOMINAL HYSTERECTOMY      Current Medications: Current Meds  Medication Sig  . amLODipine (NORVASC) 10 MG tablet Take 1 tablet (10 mg total) by mouth daily.  Marland Kitchen aspirin EC 81 MG tablet Take 1 tablet (81 mg total) by  mouth daily.  Marland Kitchen atorvastatin (LIPITOR) 80 MG tablet Take 1 tablet (80 mg total) by mouth daily.  . budesonide-formoterol (SYMBICORT) 80-4.5 MCG/ACT inhaler Inhale 2 puffs into the lungs 2 (two) times daily.  . carvedilol (COREG) 12.5 MG tablet Take 1 tablet (12.5 mg total) by mouth 2 (two) times daily with a meal.  . diclofenac sodium (VOLTAREN) 1 % GEL Apply 2 g topically 4 (four) times daily.  Marland Kitchen gabapentin (NEURONTIN) 100 MG capsule Take 2 capsules (200 mg total) by mouth at bedtime.  Marland Kitchen losartan (COZAAR) 25 MG tablet Take 1 tablet (25 mg total) by mouth daily.  . metFORMIN (GLUCOPHAGE) 500 MG tablet Take 1 tablet (500 mg total) by mouth daily with breakfast.  . omeprazole (PRILOSEC) 20 MG capsule Take 1 capsule (20 mg total) by mouth daily.  . [DISCONTINUED] atorvastatin (LIPITOR) 40 MG tablet Take 1.5 tablets (60 mg total) by mouth daily.     Allergies:    Patient has no known allergies.   Social History: Social History   Socioeconomic History  . Marital status: Single    Spouse name: Not on file  . Number of children: 2  . Years of education: Not on file  . Highest education level: Not on file  Occupational History  . Not on file  Tobacco Use  . Smoking status:  Passive Smoke Exposure - Never Smoker  . Smokeless tobacco: Never Used  . Tobacco comment: spouse smoked in home X6-7 years.   Vaping Use  . Vaping Use: Never used  Substance and Sexual Activity  . Alcohol use: No  . Drug use: Not on file  . Sexual activity: Not on file  Other Topics Concern  . Not on file  Social History Narrative  . Not on file   Social Determinants of Health   Financial Resource Strain:   . Difficulty of Paying Living Expenses: Not on file  Food Insecurity:   . Worried About Programme researcher, broadcasting/film/videounning Out of Food in the Last Year: Not on file  . Ran Out of Food in the Last Year: Not on file  Transportation Needs:   . Lack of Transportation (Medical): Not on file  . Lack of Transportation (Non-Medical):  Not on file  Physical Activity:   . Days of Exercise per Week: Not on file  . Minutes of Exercise per Session: Not on file  Stress:   . Feeling of Stress : Not on file  Social Connections:   . Frequency of Communication with Friends and Family: Not on file  . Frequency of Social Gatherings with Friends and Family: Not on file  . Attends Religious Services: Not on file  . Active Member of Clubs or Organizations: Not on file  . Attends BankerClub or Organization Meetings: Not on file  . Marital Status: Not on file     Family History: The patient's family history includes Hypertension in her mother.  ROS:   All other ROS reviewed and negative. Pertinent positives noted in the HPI.     EKGs/Labs/Other Studies Reviewed:   The following studies were personally reviewed by me today:  NM Stress 02/24/2019  Nuclear stress EF: 73%. The left ventricular ejection fraction is hyperdynamic (>65%).  There was no ST segment deviation noted during stress.  This is a low risk study.  The study is normal. There is no evidence of ischemia or previous infarction.   Recent Labs: 02/03/2019: ALT 17; BUN 10; Creatinine, Ser 0.68; Platelets 423; Potassium 4.4; Sodium 143 09/21/2019: BNP 21.3; Hemoglobin 15.5   Recent Lipid Panel    Component Value Date/Time   CHOL 275 (H) 09/21/2019 1210   TRIG 787 (HH) 09/21/2019 1210   HDL 36 (L) 09/21/2019 1210   CHOLHDL 7.6 (H) 09/21/2019 1210   LDLCALC 102 (H) 09/21/2019 1210    Physical Exam:   VS:  BP 136/70   Pulse 78   Wt 126 lb (57.2 kg)   LMP  (LMP Unknown)   SpO2 96%   BMI 26.33 kg/m    Wt Readings from Last 3 Encounters:  10/09/19 126 lb (57.2 kg)  09/21/19 124 lb 6.4 oz (56.4 kg)  05/25/19 124 lb (56.2 kg)    General: Well nourished, well developed, in no acute distress Heart: Atraumatic, normal size  Eyes: PEERLA, EOMI  Neck: Supple, no JVD Endocrine: No thryomegaly Cardiac: Normal S1, S2; RRR; no murmurs, rubs, or gallops Lungs: Clear  to auscultation bilaterally, no wheezing, rhonchi or rales  Abd: Soft, nontender, no hepatomegaly  Ext: No edema, pulses 2+ Musculoskeletal: No deformities, BUE and BLE strength normal and equal Skin: Warm and dry, no rashes   Neuro: Alert and oriented to person, place, time, and situation, CNII-XII grossly intact, no focal deficits  Psych: Normal mood and affect   ASSESSMENT:   Tammy Osborne is a 64 y.o. female who  presents for the following: 1. Chest pain, unspecified type   2. Coronary artery disease involving native coronary artery of native heart without angina pectoris   3. Mixed hyperlipidemia   4. Essential hypertension   5. Hypertriglyceridemia   6. Hyperlipidemia associated with type 2 diabetes mellitus (HCC)     PLAN:   1. Chest pain, unspecified type -Chest pain worse after eating and associated with cough.  Negative nuclear medicine stress test.  I think her symptoms are GERD related.  She will continue her acid reducing agents and possibly seek other agents if no improvement.  2. Coronary artery disease involving native coronary artery of native heart without angina pectoris -She did have coronary calcification seen on recent CT scan.  Normal myocardial perfusion imaging study.  She will continue aspirin 81 mg daily.  Increase her Lipitor 80 mg daily.  We will add Vascepa 2 g twice daily for her elevated triglycerides.  Given her diabetes and coronary calcification she will need to get a triglyceride less than 150 to reduce her risk of heart disease.  Clearly getting her triglycerides less than 500 will reduce her risk of pancreatitis. -Blood pressure well controlled. -No angina.  3. Mixed hyperlipidemia -Increase Lipitor to 80 mg daily.  Need to get LDL less than 70.  Add Vascepa 2 g twice daily.  She was given samples.  Need to get her triglycerides less than 150.  4. Essential hypertension -Well-controlled no change in medications.  5.  Hypertriglyceridemia -Triglyceride 787.  Add Vascepa 2 g twice daily.  Increase Lipitor to 80 mg daily.  6. Hyperlipidemia associated with type 2 diabetes mellitus (HCC) -Cholesterol management as above.   Disposition: Return in about 3 months (around 01/08/2020).  Medication Adjustments/Labs and Tests Ordered: Current medicines are reviewed at length with the patient today.  Concerns regarding medicines are outlined above.  Orders Placed This Encounter  Procedures  . Lipid panel   Meds ordered this encounter  Medications  . icosapent Ethyl (VASCEPA) 1 g capsule    Sig: Take 2 capsules (2 g total) by mouth 2 (two) times daily.    Dispense:  180 capsule    Refill:  1  . atorvastatin (LIPITOR) 80 MG tablet    Sig: Take 1 tablet (80 mg total) by mouth daily.    Dispense:  90 tablet    Refill:  1    Dose increase    Patient Instructions  Medication Instructions:  Increase LIPITOR 80 mg daily  Start Vascepa 2 g twice daily  *If you need a refill on your cardiac medications before your next appointment, please call your pharmacy*  Lab Work: LIPID 1 week before   If you have labs (blood work) drawn today and your tests are completely normal, you will receive your results only by: Marland Kitchen MyChart Message (if you have MyChart) OR . A paper copy in the mail If you have any lab test that is abnormal or we need to change your treatment, we will call you to review the results.   Follow-Up: At [facility], you and your health needs are our priority.  As part of our continuing mission to provide you with exceptional heart care, we have created designated Provider Care Teams.  These Care Teams include your primary Cardiologist (physician) and Advanced Practice Providers (APPs -  Physician Assistants and Nurse Practitioners) who all work together to provide you with the care you need, when you need it.  We recommend signing up for the  patient portal called "MyChart".  Sign up information  is provided on this After Visit Summary.  MyChart is used to connect with patients for Virtual Visits (Telemedicine).  Patients are able to view lab/test results, encounter notes, upcoming appointments, etc.  Non-urgent messages can be sent to your provider as well.   To learn more about what you can do with MyChart, go to [email or web address].    Your next appointment:   3 month(s)  The format for your next appointment:   In Person  Provider:   Lennie Odor, MD         Time Spent with Patient: I have spent a total of 35 minutes with patient reviewing hospital notes, telemetry, EKGs, labs and examining the patient as well as establishing an assessment and plan that was discussed with the patient.  > 50% of time was spent in direct patient care.  Signed, Lenna Gilford. Flora Lipps, MD [facility]  [address] [place], [place] [address] [phone]  10/09/2019 11:17 AM

## 2019-10-09 ENCOUNTER — Other Ambulatory Visit: Payer: Self-pay | Admitting: Cardiovascular Disease

## 2019-10-09 ENCOUNTER — Other Ambulatory Visit: Payer: Self-pay

## 2019-10-09 ENCOUNTER — Ambulatory Visit (INDEPENDENT_AMBULATORY_CARE_PROVIDER_SITE_OTHER): Payer: Self-pay | Admitting: Cardiovascular Disease

## 2019-10-09 ENCOUNTER — Encounter: Payer: Self-pay | Admitting: Cardiovascular Disease

## 2019-10-09 VITALS — BP 136/70 | HR 78 | Ht <= 58 in | Wt 126.0 lb

## 2019-10-09 DIAGNOSIS — I10 Essential (primary) hypertension: Secondary | ICD-10-CM

## 2019-10-09 DIAGNOSIS — E782 Mixed hyperlipidemia: Secondary | ICD-10-CM

## 2019-10-09 DIAGNOSIS — E1169 Type 2 diabetes mellitus with other specified complication: Secondary | ICD-10-CM

## 2019-10-09 DIAGNOSIS — I2510 Atherosclerotic heart disease of native coronary artery without angina pectoris: Secondary | ICD-10-CM

## 2019-10-09 DIAGNOSIS — E781 Pure hyperglyceridemia: Secondary | ICD-10-CM

## 2019-10-09 DIAGNOSIS — R079 Chest pain, unspecified: Secondary | ICD-10-CM

## 2019-10-09 DIAGNOSIS — E785 Hyperlipidemia, unspecified: Secondary | ICD-10-CM

## 2019-10-09 MED ORDER — ICOSAPENT ETHYL 1 G PO CAPS: 2.0000 g | ORAL_CAPSULE | Freq: Two times a day (BID) | ORAL | 1 refills | Status: DC

## 2019-10-09 MED ORDER — ATORVASTATIN CALCIUM 80 MG PO TABS: 80.0000 mg | ORAL_TABLET | Freq: Every day | ORAL | 1 refills | Status: DC

## 2019-10-09 NOTE — Patient Instructions (Signed)
Medication Instructions:  Increase LIPITOR 80 mg daily  Start Vascepa 2 g twice daily  *If you need a refill on your cardiac medications before your next appointment, please call your pharmacy*  Lab Work: LIPID 1 week before   If you have labs (blood work) drawn today and your tests are completely normal, you will receive your results only by: Marland Kitchen MyChart Message (if you have MyChart) OR . A paper copy in the mail If you have any lab test that is abnormal or we need to change your treatment, we will call you to review the results.   Follow-Up: At [facility], you and your health needs are our priority.  As part of our continuing mission to provide you with exceptional heart care, we have created designated Provider Care Teams.  These Care Teams include your primary Cardiologist (physician) and Advanced Practice Providers (APPs -  Physician Assistants and Nurse Practitioners) who all work together to provide you with the care you need, when you need it.  We recommend signing up for the patient portal called "MyChart".  Sign up information is provided on this After Visit Summary.  MyChart is used to connect with patients for Virtual Visits (Telemedicine).  Patients are able to view lab/test results, encounter notes, upcoming appointments, etc.  Non-urgent messages can be sent to your provider as well.   To learn more about what you can do with MyChart, go to [email or web address].    Your next appointment:   3 month(s)  The format for your next appointment:   In Person  Provider:   Lennie Odor, MD

## 2019-10-16 ENCOUNTER — Other Ambulatory Visit: Payer: Self-pay

## 2019-10-16 ENCOUNTER — Encounter: Payer: Self-pay | Admitting: Critical Care Medicine

## 2019-10-16 ENCOUNTER — Ambulatory Visit (INDEPENDENT_AMBULATORY_CARE_PROVIDER_SITE_OTHER): Payer: Self-pay | Admitting: Critical Care Medicine

## 2019-10-16 ENCOUNTER — Other Ambulatory Visit: Payer: Self-pay | Admitting: Critical Care Medicine

## 2019-10-16 VITALS — BP 120/68 | HR 88 | Temp 98.1°F | Ht <= 58 in | Wt 125.8 lb

## 2019-10-16 DIAGNOSIS — J4540 Moderate persistent asthma, uncomplicated: Secondary | ICD-10-CM

## 2019-10-16 MED ORDER — ALBUTEROL SULFATE HFA 108 (90 BASE) MCG/ACT IN AERS: 2.0000 | INHALATION_SPRAY | RESPIRATORY_TRACT | 11 refills | Status: DC | PRN

## 2019-10-16 NOTE — Progress Notes (Signed)
Synopsis: Referred in September 2018 for ILD by Marcine Matar, MD.  Previously a patient of Dr. Kendrick Fries.  Subjective:   PATIENT ID: [name] GENDER: female DOB: 07-05-1955, MRN: [id number]  Chief Complaint  Patient presents with   Follow-up    Patient has been doing good since last visit. No concerns at this time    Spanish translator: Vernona Rieger  Ms. Tammy Osborne is a 64 year old Spanish-speaking woman who presents for follow-up of asthma.  She was initially referred for evaluation of a CT scan several years ago that demonstrated diffuse groundglass opacities of centrilobular nodules.  With no specific treatment this had resolved on a follow-up scan in 2020, where her only abnormalities were mosaicism and air trapping.  She was off maintenance inhalers for several months, but per PCP notes was restarted in August on Symbicort twice daily, and since then she has had a significant improvement in her symptoms.  She denies nighttime symptoms, wheezing, and only has minimal coughing.  Her only activity limitation due to shortness of breath is inability to walk fast.  She continues taking omeprazole once daily and has no issues with reflux.  Recent cardiology note from 10/09/2019 reviewed.  Her main issue is lipid control, and she had no concerning cardiac symptoms with exercise.    OV 11/05/2018: Translator used- Lucy: [name] is a 64 year old woman with a history of an abnormal chest CT and restrictive lung disease who was referred in 2018 for evaluation. She was last seen in September 2019.  At that time she was having coughing and dyspnea. Over the last 2 years this had been stable and was felt to be due to a previous infection causing scarring. She has been on Advair for the last few years, but does not feel that she is using it correctly be at medication, and she is concerned about the cost.  Despite this she does feel that it helps.  She works at [employer] and  has for several years.  She is fairly active at work, and has no limitations in her activity due to breathing.  She denies shortness of breath, wheezing, cough, sputum production, fever, chills, sweats, change in appetite, or weight loss.  She moved to the [place] from [place] about 10 years ago.  As a child they had indoor wood-burning stoves, but not as an adult.  She is a never smoker/ vaper.   He has not yet had her flu shot this year.    Past Medical History:  Diagnosis Date   Diabetes mellitus without complication (HCC)    Hypertension      Family History  Problem Relation Age of Onset   Hypertension Mother      Past Surgical History:  Procedure Laterality Date   ABDOMINAL HYSTERECTOMY      Social History   Socioeconomic History   Marital status: Single    Spouse name: Not on file   Number of children: 2   Years of education: Not on file   Highest education level: Not on file  Occupational History   Not on file  Tobacco Use   Smoking status: Passive Smoke Exposure - Never Smoker   Smokeless tobacco: Never Used   Tobacco comment: spouse smoked in home X6-7 years.   Vaping Use   Vaping Use: Never used  Substance and Sexual Activity   Alcohol use: No   Drug use: Not on file   Sexual activity: Not on file  Other Topics Concern   Not on file  Social History Narrative   Not on file   Social Determinants of Health   Financial Resource Strain:    Difficulty of Paying Living Expenses: Not on file  Food Insecurity:    Worried About Running Out of Food in the Last Year: Not on file   Ran Out of Food in the Last Year: Not on file  Transportation Needs:    Lack of Transportation (Medical): Not on file   Lack of Transportation (Non-Medical): Not on file  Physical Activity:    Days of Exercise per Week: Not on file   Minutes of Exercise per Session: Not on file  Stress:    Feeling of Stress : Not on file  Social Connections:     Frequency of Communication with Friends and Family: Not on file   Frequency of Social Gatherings with Friends and Family: Not on file   Attends Religious Services: Not on file   Active Member of Clubs or Organizations: Not on file   Attends BankerClub or Organization Meetings: Not on file   Marital Status: Not on file  Intimate Partner Violence:    Fear of Current or Ex-Partner: Not on file   Emotionally Abused: Not on file   Physically Abused: Not on file   Sexually Abused: Not on file     No Known Allergies   Immunization History  Administered Date(s) Administered   Influenza,inj,Quad PF,6+ Mos 10/19/2016, 09/19/2017, 11/05/2018   Moderna SARS-COVID-2 Vaccination 06/27/2019, 07/25/2019   Pneumococcal Polysaccharide-23 10/19/2016   Tdap 12/24/2016    Outpatient Medications Prior to Visit  Medication Sig Dispense Refill   amLODipine (NORVASC) 10 MG tablet Take 1 tablet (10 mg total) by mouth daily. 30 tablet 6   aspirin EC 81 MG tablet Take 1 tablet (81 mg total) by mouth daily. 100 tablet 1   atorvastatin (LIPITOR) 80 MG tablet Take 1 tablet (80 mg total) by mouth daily. 90 tablet 1   budesonide-formoterol (SYMBICORT) 80-4.5 MCG/ACT inhaler Inhale 2 puffs into the lungs 2 (two) times daily. 1 each 12   carvedilol (COREG) 12.5 MG tablet Take 1 tablet (12.5 mg total) by mouth 2 (two) times daily with a meal. 60 tablet 4   diclofenac sodium (VOLTAREN) 1 % GEL Apply 2 g topically 4 (four) times daily. 100 g 1   gabapentin (NEURONTIN) 100 MG capsule Take 2 capsules (200 mg total) by mouth at bedtime. 60 capsule 3   icosapent Ethyl (VASCEPA) 1 g capsule Take 2 capsules (2 g total) by mouth 2 (two) times daily. 180 capsule 1   losartan (COZAAR) 25 MG tablet Take 1 tablet (25 mg total) by mouth daily. 30 tablet 6   metFORMIN (GLUCOPHAGE) 500 MG tablet Take 1 tablet (500 mg total) by mouth daily with breakfast. 90 tablet 3   omeprazole (PRILOSEC) 20 MG capsule Take 1  capsule (20 mg total) by mouth daily. 30 capsule 6   No facility-administered medications prior to visit.    Review of Systems  Constitutional: Negative for chills, fever and weight loss.  HENT: Negative.   Eyes: Negative.   Respiratory: Negative for cough, sputum production, shortness of breath and wheezing.   Cardiovascular: Negative for chest pain and leg swelling.  Gastrointestinal: Negative.   Genitourinary: Negative.   Musculoskeletal: Negative for joint pain and myalgias.  Skin: Negative for rash.     Objective:   Vitals:   10/16/19 1142  BP: 120/68  Pulse: 88  Temp: 98.1 F (36.7 C)  TempSrc: Temporal  SpO2: 93%  Weight: 125 lb 12.8 oz (57.1 kg)  Height: 4\' 10"  (1.473 m)   93% on  RA BMI Readings from Last 3 Encounters:  10/16/19 26.29 kg/m  10/09/19 26.33 kg/m  09/21/19 26.00 kg/m   Wt Readings from Last 3 Encounters:  10/16/19 125 lb 12.8 oz (57.1 kg)  10/09/19 126 lb (57.2 kg)  09/21/19 124 lb 6.4 oz (56.4 kg)    Physical Exam Vitals reviewed.  Constitutional:      General: She is not in acute distress.    Appearance: Normal appearance. She is not ill-appearing.  HENT:     Head: Normocephalic and atraumatic.  Eyes:     General: No scleral icterus. Cardiovascular:     Rate and Rhythm: Normal rate and regular rhythm.     Heart sounds: No murmur heard.   Pulmonary:     Comments: Breathing comfortably on room air, no conversational dyspnea.  No observed coughing.  Clear to auscultation bilaterally. Abdominal:     General: There is no distension.     Palpations: Abdomen is soft.     Tenderness: There is no abdominal tenderness.  Musculoskeletal:        General: No swelling or deformity.     Cervical back: Neck supple.  Lymphadenopathy:     Cervical: No cervical adenopathy.  Skin:    General: Skin is warm and dry.     Findings: No erythema ( ).  Neurological:     General: No focal deficit present.     Mental Status: She is alert.      Cranial Nerves: No cranial nerve deficit.     Coordination: Coordination normal.  Psychiatric:        Mood and Affect: Mood normal.        Behavior: Behavior normal.      CBC    Component Value Date/Time   WBC 13.7 (H) 09/21/2019 1210   WBC 29.8 (H) 08/15/2017 0611   RBC 5.79 (H) 09/21/2019 1210   RBC 5.40 (H) 08/15/2017 0611   HGB 15.5 09/21/2019 1210   HCT 46.5 09/21/2019 1210   PLT 423 02/03/2019 0924   MCV 80 09/21/2019 1210   MCH 26.8 09/21/2019 1210   MCH 27.0 08/15/2017 0611   MCHC 33.3 09/21/2019 1210   MCHC 32.8 08/15/2017 0611   RDW 17.2 (H) 09/21/2019 1210   LYMPHSABS 5.0 (H) 09/21/2019 1210   MONOABS 0.9 10/15/2016 1553   EOSABS 0.0 09/21/2019 1210   BASOSABS 0.1 09/21/2019 1210    10/15/2016 serologies SSA < 1 SSB < 1  SCL-70 < 1 ANA negative Anti-Jo < 0.2 Anticentromere < 1 RF < 14 HP panel negative   Chest Imaging- films reviewed: CT high resolution 10/10/2018- mosaicism throughout her lungs, but no inter or intralobular septal thickening, no honeycombing.  No significant adenopathy or pleural disease.  No apparent progression radiographically, and possibly improved compared to 2018 images.  CT high resolution 09/14/2016-mosaicism throughout her lungs, specifically with less groundglass and several secondary lobules in the subpleural distribution.  More prominent mosaicism during expiration.  CXR, 2 view 09/28/2019-normal  Pulmonary Functions Testing Results: PFT Results Latest Ref Rng & Units 06/18/2017 11/22/2016  FVC-Pre L 1.57 1.73  FVC-Predicted Pre % 58 63  FVC-Post L 1.58 1.76  FVC-Predicted Post % 58 64  Pre FEV1/FVC % % 91 92  Post FEV1/FCV % % 92 91  FEV1-Pre L 1.42 1.59  FEV1-Predicted Pre %  68 76  FEV1-Post L 1.46 1.60  DLCO uncorrected ml/min/mmHg 11.45 11.90  DLCO UNC% % 65 67  DLCO corrected ml/min/mmHg - 11.75  DLCO COR %Predicted % - 67  DLVA Predicted % 104 98  TLC L 2.99 2.81  TLC % Predicted % 69 65  RV % Predicted % 42  55   2019- No significant obstruction, but severe restriction by TLC (nitrogen washout), but mild to moderate based on spirometry.  DLCO mildly reduced.  Flow volume loop supports restriction. In comparison to 2018 PFTs, her total lung capacity increased but her FEV1 & FVC were decreased and her DLCO was trivially decreased.   Echocardiogram: LVEF 60 to 65%, moderate LVH but normal diastolic function.  Normal LA, RV, RA.  Mild aortic sclerosis without stenosis, normal mitral valve, mild TR.  Nuclear MPI 02/24/2019- low risk study    Assessment & Plan:     ICD-10-CM   1. Moderate persistent asthma without complication  J45.40 Pulmonary function test      Moderate persistent asthma-currently well controlled -Continue Symbicort twice daily.  Rinse her mouth after every use. -Albuterol every 4 hours as needed -Continue Covid precautions. -Up-to-date on pneumonia, Covid vaccines.  Recommend flu vaccine when available to her.  Abnormal chest CT with diffuse groundglass opacities without significant architectural destruction on CT in 2018> regressed in 2020.  Worsening during expiratory phase demonstrate air trapping.  I think this is most likely due to hypersensitivity pneumonitis, although asthma can also give this CT appearance.  She has not eliminated anything from her environment that we are aware of that would explain this improving rather than progressing over time.  Previous limited HP panel negative.  Luckily this is not progressing over time. -No additional work-up needed unless she has worsening symptoms.   RTC in 3 months with Dr. Celine Mans.   Current Outpatient Medications:    amLODipine (NORVASC) 10 MG tablet, Take 1 tablet (10 mg total) by mouth daily., Disp: 30 tablet, Rfl: 6   aspirin EC 81 MG tablet, Take 1 tablet (81 mg total) by mouth daily., Disp: 100 tablet, Rfl: 1   atorvastatin (LIPITOR) 80 MG tablet, Take 1 tablet (80 mg total) by mouth daily., Disp: 90 tablet, Rfl:  1   budesonide-formoterol (SYMBICORT) 80-4.5 MCG/ACT inhaler, Inhale 2 puffs into the lungs 2 (two) times daily., Disp: 1 each, Rfl: 12   carvedilol (COREG) 12.5 MG tablet, Take 1 tablet (12.5 mg total) by mouth 2 (two) times daily with a meal., Disp: 60 tablet, Rfl: 4   diclofenac sodium (VOLTAREN) 1 % GEL, Apply 2 g topically 4 (four) times daily., Disp: 100 g, Rfl: 1   gabapentin (NEURONTIN) 100 MG capsule, Take 2 capsules (200 mg total) by mouth at bedtime., Disp: 60 capsule, Rfl: 3   icosapent Ethyl (VASCEPA) 1 g capsule, Take 2 capsules (2 g total) by mouth 2 (two) times daily., Disp: 180 capsule, Rfl: 1   losartan (COZAAR) 25 MG tablet, Take 1 tablet (25 mg total) by mouth daily., Disp: 30 tablet, Rfl: 6   metFORMIN (GLUCOPHAGE) 500 MG tablet, Take 1 tablet (500 mg total) by mouth daily with breakfast., Disp: 90 tablet, Rfl: 3   omeprazole (PRILOSEC) 20 MG capsule, Take 1 capsule (20 mg total) by mouth daily., Disp: 30 capsule, Rfl: 6   albuterol (VENTOLIN HFA) 108 (90 Base) MCG/ACT inhaler, Inhale 2 puffs into the lungs every 4 (four) hours as needed., Disp: 18 g, Rfl: 11   Steffanie Dunn,  DO [facility] 10/16/2019 12:53 PM

## 2019-10-16 NOTE — Patient Instructions (Addendum)
Thank you for visiting Dr. Chestine Spore at [facility]. We recommend the following: Orders Placed This Encounter  Procedures  . Pulmonary function test   Orders Placed This Encounter  Procedures  . Pulmonary function test    Standing Status:   Future    Standing Expiration Date:   10/15/2020    Order Specific Question:   Where should this test be performed?    Answer:   [facility]    Order Specific Question:   Full PFT: includes the following: basic spirometry, spirometry pre & post bronchodilator, diffusion capacity (DLCO), lung volumes    Answer:   Full PFT   Debe tomar Symbicort 2 veces al da como medicamento de mantenimiento. Dura aproximadamente 12 horas y debe mantener sus pulmones funcionando normalmente. No lo tome con ms o menos frecuencia de lo recetado.  You should take your Symbicort 2 times per day for your maintenance medicine. It lasts about 12 hours and should keep your lungs functioning normally. Do not take it more or less often than prescribed.   SI sus sntomas son peores de lo normal (generalmente cuando tiene un resfriado, tiene Environmental consultant o cambios climticos significativos, pero puede ser en otras ocasiones), debe tomar albuterol adems de su Symbicort. El albuterol es su inhalador de rescate. Puedes usarlo hasta cada 4 horas.  If your symptoms are worse than normal (usually when you get a cold, have allergies, or significant weather changes, but it can be other times), you should take albuterol in addition to your Symbicort. Albuterol is your rescue inhaler. You can use it up to every 4 hours.  Meds ordered this encounter  Medications  . albuterol (VENTOLIN HFA) 108 (90 Base) MCG/ACT inhaler    Sig: Inhale 2 puffs into the lungs every 4 (four) hours as needed.    Dispense:  18 g    Refill:  11    Please print instructions in spanish if possible    Return in about 3 months (around 01/15/2020). with Dr. Celine Mans  (30 minutes visit).    Please do your part  to reduce the spread of COVID-19.

## 2019-11-17 ENCOUNTER — Other Ambulatory Visit: Payer: Self-pay

## 2019-11-17 ENCOUNTER — Ambulatory Visit: Payer: Self-pay | Attending: Internal Medicine | Admitting: Pharmacist

## 2019-11-17 DIAGNOSIS — Z23 Encounter for immunization: Secondary | ICD-10-CM

## 2019-11-17 NOTE — Progress Notes (Signed)
Patient presents for vaccination against influenza per orders of Dr. Johnson. Consent given. Counseling provided. No contraindications exists. Vaccine administered without incident.  ° °Luke Van Ausdall, PharmD, CPP °Clinical Pharmacist °[facility] °[phone] ° °

## 2020-01-12 ENCOUNTER — Telehealth: Payer: Self-pay | Admitting: Internal Medicine

## 2020-01-12 NOTE — Telephone Encounter (Signed)
Copied from CRM [id number]. Topic: General - Inquiry >> Jan 11, 2020  1:11 PM Leafy Ro wrote: Reason for CRM: Pt is calling and would an appt to reapply for orange card. >> Jan 11, 2020  1:15 PM Leafy Ro wrote: Pt would like an appt on 01/18/2020 in afternoon

## 2020-01-12 NOTE — Telephone Encounter (Signed)
I return pt call, LVM to back in a Monday since I have no appt available right now

## 2020-01-18 ENCOUNTER — Ambulatory Visit: Payer: Self-pay | Admitting: Cardiovascular Disease

## 2020-02-05 ENCOUNTER — Ambulatory Visit: Payer: No Typology Code available for payment source | Attending: Internal Medicine

## 2020-02-05 ENCOUNTER — Other Ambulatory Visit: Payer: Self-pay

## 2020-02-10 ENCOUNTER — Telehealth: Payer: Self-pay

## 2020-02-10 NOTE — Telephone Encounter (Signed)
Called patient, advised to call back to discuss moving appointment on January 17th to a later appointment time in the afternoon due to possible weather.  Left call back number.

## 2020-02-15 ENCOUNTER — Ambulatory Visit: Payer: Self-pay | Admitting: Cardiovascular Disease

## 2020-02-24 ENCOUNTER — Ambulatory Visit: Payer: No Typology Code available for payment source | Admitting: Internal Medicine

## 2020-03-19 ENCOUNTER — Other Ambulatory Visit (HOSPITAL_COMMUNITY)
Admission: RE | Admit: 2020-03-19 | Discharge: 2020-03-19 | Disposition: A | Discharge status: Home / Self Care | Payer: No Typology Code available for payment source | Source: Ambulatory Visit | Attending: Internal Medicine | Admitting: Internal Medicine

## 2020-03-19 DIAGNOSIS — Z01812 Encounter for preprocedural laboratory examination: Secondary | ICD-10-CM | POA: Insufficient documentation

## 2020-03-19 DIAGNOSIS — Z20822 Contact with and (suspected) exposure to covid-19: Secondary | ICD-10-CM | POA: Insufficient documentation

## 2020-03-19 LAB — SARS CORONAVIRUS 2 (TAT 6-24 HRS): SARS Coronavirus 2: NEGATIVE

## 2020-03-23 ENCOUNTER — Ambulatory Visit: Payer: No Typology Code available for payment source | Admitting: Critical Care Medicine

## 2020-03-23 ENCOUNTER — Encounter: Payer: Self-pay | Admitting: Internal Medicine

## 2020-03-23 ENCOUNTER — Other Ambulatory Visit: Payer: Self-pay

## 2020-03-23 ENCOUNTER — Ambulatory Visit (INDEPENDENT_AMBULATORY_CARE_PROVIDER_SITE_OTHER): Payer: No Typology Code available for payment source | Admitting: Internal Medicine

## 2020-03-23 VITALS — BP 124/78 | HR 83 | Temp 98.0°F | Ht <= 58 in | Wt 125.0 lb

## 2020-03-23 DIAGNOSIS — J4540 Moderate persistent asthma, uncomplicated: Secondary | ICD-10-CM

## 2020-03-23 DIAGNOSIS — K219 Gastro-esophageal reflux disease without esophagitis: Secondary | ICD-10-CM

## 2020-03-23 LAB — PULMONARY FUNCTION TEST
FEF 25-75 Post: 3.05 L/sec
FEF 25-75 Pre: 3.04 L/sec
FEF2575-%Change-Post: 0 %
FEF2575-%Pred-Post: 161 %
FEF2575-%Pred-Pre: 161 %
FEV1-%Change-Post: 1 %
FEV1-%Pred-Post: 61 %
FEV1-%Pred-Pre: 61 %
FEV1-Post: 1.19 L
FEV1-Pre: 1.18 L
FEV1FVC-%Change-Post: -2 %
FEV1FVC-%Pred-Pre: 127 %
FEV6-%Change-Post: 3 %
FEV6-%Pred-Post: 50 %
FEV6-%Pred-Pre: 49 %
FEV6-Post: 1.23 L
FEV6-Pre: 1.2 L
FEV6FVC-%Pred-Post: 104 %
FEV6FVC-%Pred-Pre: 104 %
FVC-%Change-Post: 3 %
FVC-%Pred-Post: 48 %
FVC-%Pred-Pre: 47 %
FVC-Post: 1.23 L
FVC-Pre: 1.2 L
Post FEV1/FVC ratio: 97 %
Post FEV6/FVC ratio: 100 %
Pre FEV1/FVC ratio: 99 %
Pre FEV6/FVC Ratio: 100 %
RV % pred: 45 %
RV: 0.8 L
TLC % pred: 60 %
TLC: 2.51 L

## 2020-03-23 NOTE — Progress Notes (Signed)
Clarrisa Kaylor    [id number]    1955/03/20  Primary Care Physician:Johnson, Binnie Rail, MD Date of Appointment: 03/23/2020 Established Patient Visit  Chief complaint:  Cough, shortness of breath   HPI: Tammy Osborne is a 65 y.o. woman spanish speaking who presents for follow up for asthma. [place] moved here around 2007. Never smoker, but did have indoor wood burning stove in childhood. Former patient of Dr. Chestine Spore, establishing care with me today.   Interval Updates: Here for follow up for cough and shortness of breath felt to be secondary to asthma. Had PFTs today as well. Interpreter present.   In a closed space she has trouble breathing. She also has cough which gets worse as the day goes on. The cough does wake her up at night and is dry without phlegm. The albuterol does help the cough.  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 She denies dyspnea at work.   She does have reflux and takes omeprazole for this.   Has lived in the same house for 5 years. With her brother sister and a nephew. No one else has breathing problems at home. She has worked in the same job for the last 5 years. No pets at home. No chickens.  She has been prescribed prednisone for her breathing, by PCP, which was 6 months ago. This did seem to help her breathing.   ILD REVIEW OF SYSTEMS No exposure to asbestos, silica or other organic allergens    -pt works with fruit packaging, previously worked in Education officer, environmental houses.  No birds or chickens No water damage or mold exposure in home No hot tub at home No arthralgias or myalgias Denies skin rash or lesions Denies nail changes or finger splitting Denies Raynaud's Denies dry eyes or dry mouth Denies dysphagia or heartburn PPD negative and no h/o exposure No h/o chemo/XRT/amiodarone/macrodantin/MTX Previously lived in [place] Has lived in the [place] f  Denies lightheadedness, syncope or h/o seizure Denies  vision changes Denies atopy or sinusitis Denies excessive thirst or urination. No h/o DM Denies wt change or heat or cold intolerance. No h/o thyroid disorder Denies CP, edema or palpitations Regular BMs with no hematochezia or melena No hematuria, kidney stones or known renal disease   I have reviewed the patient's family social and past medical history and updated as appropriate.   Past Medical History:  Diagnosis Date  . Diabetes mellitus without complication (HCC)   . Hypertension     Past Surgical History:  Procedure Laterality Date  . ABDOMINAL HYSTERECTOMY      Family History  Problem Relation Age of Onset  . Hypertension Mother     Social History   Occupational History  . Not on file  Tobacco Use  . Smoking status: Passive Smoke Exposure - Never Smoker  . Smokeless tobacco: Never Used  . Tobacco comment: spouse smoked in home X6-7 years.   Vaping Use  . Vaping Use: Never used  Substance and Sexual Activity  . Alcohol use: No  . Drug use: Not on file  . Sexual activity: Not on file     Physical Exam: Blood pressure 124/78, pulse 83, temperature 98 F (36.7 C), height 4\' 10"  (1.473 m), weight 125 lb (56.7 kg), SpO2 97 %.  Gen:      No acute distress Lungs:   Inspiratory sqwak bilaterally.  CV:         Regular  rate and rhythm; no murmurs, rubs, or gallops.  No pedal edema   Data Reviewed: Imaging: I have personally reviewed the CT scan from Sept 2020 which shows mosaic attenuation. The ct chest in 2018 has diffuse GGOs which have improved in 2020.    PFTs:  PFT Results Latest Ref Rng & Units 03/23/2020 06/18/2017 11/22/2016  FVC-Pre L 1.20 1.57 1.73  FVC-Predicted Pre % 47 58 63  FVC-Post L 1.23 1.58 1.76  FVC-Predicted Post % 48 58 64  Pre FEV1/FVC % % 99 91 92  Post FEV1/FCV % % 97 92 91  FEV1-Pre L 1.18 1.42 1.59  FEV1-Predicted Pre % 61 68 76  FEV1-Post L 1.19 1.46 1.60  DLCO uncorrected ml/min/mmHg - 11.45 11.90  DLCO UNC% % - 65 67   DLCO corrected ml/min/mmHg - - 11.75  DLCO COR %Predicted % - - 67  DLVA Predicted % - 104 98  TLC L 2.51 2.99 2.81  TLC % Predicted % 60 69 65  RV % Predicted % 45 42 55   I have personally reviewed the patient's PFTs and they show moderate restriction to ventilation. This is worse when compared to PFTs in 2018-2019.   Labs: Labs reviewed - had autoimmune serologies negative in 2018. HP panel negative.   Immunization status: Immunization History  Administered Date(s) Administered  . Influenza,inj,Quad PF,6+ Mos 10/19/2016, 09/19/2017, 11/05/2018, 11/17/2019  . Moderna Sars-Covid-2 Vaccination 06/27/2019, 07/25/2019  . Pneumococcal Polysaccharide-23 10/19/2016  . Tdap 12/24/2016    Assessment:  Shortness of breath, possibly related to moderate persistent asthma versus ILD such as HP, with worsening PFTs GERD, stable  Plan/Recommendations:  Differential diagnosis includes interstitial lung disease well.  She has had a negative serologic work-up.  She never had a bronchoscopy.  Her initial CT scan in 2018 shows widespread groundglass opacities which looks suspicious for hypersensitivity pneumonitis.  This would also fit with her steroid sponsored this.  I will present her case in ILD conference.  I am mostly concerned that her lung function has dropped over the last 2 years. Continue symbicort and albuterol for now. Continue PPI for reflux.   I spent 31 minutes on 03/23/2020 in care of this patient including face to face time and non-face to face time spent charting, review of outside records, and coordination of care.     Return to Care: Return in about 3 months (around 06/20/2020).   Durel Salts, MD Pulmonary and Critical Care Medicine [facility] Office:[phone]

## 2020-03-23 NOTE — Progress Notes (Signed)
PFT performed today. Patient unable to complete DLCO due to cough.

## 2020-03-23 NOTE — Patient Instructions (Addendum)
The patient should have follow up scheduled with myself in 3 months.   Keep taking symbicort and albuterol.  Gargle after symbicort

## 2020-04-05 ENCOUNTER — Ambulatory Visit: Payer: Self-pay | Admitting: Internal Medicine

## 2020-04-07 NOTE — Progress Notes (Signed)
Interstitial Lung Disease Multidisciplinary Conference   Tammy Osborne    MRN [id number]    DOB 1955/07/13  Primary Care Physician:Johnson, Binnie Rail, MD  Referring Physician: Durel Salts  Time of Conference: 7.30am- 8.30am Date of conference: 04/05/20 Location of Conference: -  Virtual  Participating Pulmonary: Dr. Kalman Shan, MD - yes,  Dr Chilton Greathouse, MD - yes Pathology: Dr Holley Bouche, MD - yes , Dr Pecola Leisure - no Radiology: Dr Cleone Slim MD - no, Dr Trudie Reed MD - yes,  Dr Leanna Battles, MD - no , Dr Lauralyn Primes - no Others: Pietro Cassis Rn, Presenter DR Durel Salts, Dr Charolett Bumpers  Brief History:   Came to see Dr Tammy Osborne for asthma but Dr Tammy Osborne thinks it is HP. Serology negative. Lung function has decreased x 2 years 2018-> 2019 -> 2022. From [place] Has insp squawking. Did receive steroids and felt better. Exposures - ILD quession pending   Serology:  Results for Tammy Osborne (MRN [id number]) as of 04/07/2020 18:23  Ref. Range 10/15/2016 15:53 10/15/2016 15:53  Aspergillus Fumigatus Latest Ref Range: NEGATIVE   NEGATIVE  Pigeon Serum Latest Ref Range: NEGATIVE   NEGATIVE  Anti Nuclear Antibody (ANA) Latest Ref Range: NEGATIVE   NEGATIVE  Anti JO-1 Latest Ref Range: 0.0 - 0.9 AI <0.2   CENTROMERE AB SCREEN Latest Ref Range: <1.0 NEG AI <1.0 NEG   RA Latex Turbid. Latest Ref Range: <14 IU/mL  <14  SSA (Ro) (ENA) Antibody, IgG Latest Ref Range: <1.0 NEG AI <1.0 NEG <1.0 NEG  SSB (La) (ENA) Antibody, IgG Latest Ref Range: <1.0 NEG AI  <1.0 NEG  Scleroderma (Scl-70) (ENA) Antibody, IgG Latest Ref Range: <1.0 NEG AI  <1.0 NEG    MDD discussion of CT scan   Radiology - > scan has improved over time 2018 -  Mosaic attentuation, geographic margins, -> infiltrate v air trapping sept 2020 -> stil lwit Mosaic attentuation but hugely improved. Radiology asks if she has DIP v HP. Did antigen exposure go away?   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  Pathology discussion of biopsy No path:  PFTs:  PFT Results Latest Ref Rng & Units 03/23/2020 06/18/2017 11/22/2016  FVC-Pre L 1.20 1.57 1.73  FVC-Predicted Pre % 47 58 63  FVC-Post L 1.23 1.58 1.76  FVC-Predicted Post % 48 58 64  Pre FEV1/FVC % % 99 91 92  Post FEV1/FCV % % 97 92 91  FEV1-Pre L 1.18 1.42 1.59  FEV1-Predicted Pre % 61 68 76  FEV1-Post L 1.19 1.46 1.60  DLCO uncorrected ml/min/mmHg - 11.45 11.90  DLCO UNC% % - 65 67  DLCO corrected ml/min/mmHg - - 11.75  DLCO COR %Predicted % - - 67  DLVA Predicted % - 104 98  TLC L 2.51 2.99 2.81  TLC % Predicted % 60 69 65  RV % Predicted % 45 42 55     Labs: x*  MDD Impression/Recs: 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 MDD recommends completing ILD questionnaire with focus on exposure hx, repeating HRCT to asess current status and then if still suggestive of HP consider bronch with bAL +/- TTBx  VERSUS - represent at MDD   Time Spent in preparation and discussion:  > 30 min    SIGNATURE   Dr. Kalman Shan, M.D., F.C.C.P,  Pulmonary and Critical Care Medicine Staff Physician, [facility] Director -  Interstitial Lung Disease  Program  [facility] Network at [facility] [place], [place], [address]  Pager: [phone], If no answer or between  15:00h - 7:00h: call [phone] Telephone: [phone]  6:22 PM 04/07/2020 ...................................................................................................................Marland Kitchen References: Diagnosis of Hypersensitivity Pneumonitis in Adults. An Official ATS/JRS/ALAT Clinical Practice Guideline. Ragu G et al, Am J Respir Crit Care Med. 2020 Aug 1;202(3):e36-e69.       Diagnosis of Idiopathic Pulmonary Fibrosis. An Official ATS/ERS/JRS/ALAT Clinical Practice Guideline. Raghu G et al, Am J Respir Crit Care Med. 2018 Sep  1;198(5):e44-e68.   IPF Suspected   Histopath ology Pattern      UIP  Probable UIP  Indeterminate for  UIP  Alternative  diagnosis    UIP  IPF  IPF  IPF  Non-IPF dx   HRCT   Probabe UIP  IPF  IPF  IPF (Likely)**  Non-IPF dx  Pattern  Indeterminate for UIP  IPF  IPF (Likely)**  Indeterminate  for IPF**  Non-IPF dx    Alternative diagnosis  IPF (Likely)**/ non-IPF dx  Non-IPF dx  Non-IPF dx  Non-IPF dx     Idiopathic pulmonary fibrosis diagnosis based upon HRCT and Biopsy paterns.  ** IPF is the likely diagnosis when any of following features are present:  . Moderate-to-severe traction bronchiectasis/bronchiolectasis (defined as mild traction bronchiectasis/bronchiolectasis in four or more lobes including the lingual as a lobe, or moderate to severe traction bronchiectasis in two or more lobes) in a man over age 82 years or in a woman over age 53 years . Extensive (>30%) reticulation on HRCT and an age >70 years  . Increased neutrophils and/or absence of lymphocytosis in BAL fluid  . Multidisciplinary discussion reaches a confident diagnosis of IPF.   **Indeterminate for IPF  . Without an adequate biopsy is unlikely to be IPF  . With an adequate biopsy may be reclassified to a more specific diagnosis after multidisciplinary discussion and/or additional consultation.   dx = diagnosis; HRCT = high-resolution computed tomography; IPF = idiopathic pulmonary fibrosis; UIP = usual interstitial pneumonia.

## 2020-04-23 ENCOUNTER — Other Ambulatory Visit: Payer: Self-pay | Admitting: Internal Medicine

## 2020-04-23 DIAGNOSIS — E1169 Type 2 diabetes mellitus with other specified complication: Secondary | ICD-10-CM

## 2020-04-23 DIAGNOSIS — E785 Hyperlipidemia, unspecified: Secondary | ICD-10-CM

## 2020-04-23 NOTE — Telephone Encounter (Signed)
Recent dosage change. Provider discontinued this dosage on 09/21/19. Refusing at this time.

## 2020-05-05 ENCOUNTER — Other Ambulatory Visit: Payer: Self-pay

## 2020-05-05 MED FILL — Amlodipine Besylate Tab 10 MG (Base Equivalent): ORAL | 30 days supply | Qty: 30 | Fill #0 | Status: AC

## 2020-05-05 MED FILL — Atorvastatin Calcium Tab 80 MG (Base Equivalent): ORAL | 30 days supply | Qty: 30 | Fill #0 | Status: AC

## 2020-05-05 MED FILL — Icosapent Ethyl Cap 1 GM: ORAL | 30 days supply | Qty: 120 | Fill #0 | Status: AC

## 2020-05-30 ENCOUNTER — Ambulatory Visit (INDEPENDENT_AMBULATORY_CARE_PROVIDER_SITE_OTHER): Payer: No Typology Code available for payment source | Admitting: Internal Medicine

## 2020-05-30 ENCOUNTER — Other Ambulatory Visit: Payer: Self-pay

## 2020-05-30 ENCOUNTER — Encounter: Payer: Self-pay | Admitting: Internal Medicine

## 2020-05-30 VITALS — BP 132/78 | HR 82 | Ht <= 58 in | Wt 124.0 lb

## 2020-05-30 DIAGNOSIS — J679 Hypersensitivity pneumonitis due to unspecified organic dust: Secondary | ICD-10-CM

## 2020-05-30 DIAGNOSIS — J849 Interstitial pulmonary disease, unspecified: Secondary | ICD-10-CM

## 2020-05-30 DIAGNOSIS — E785 Hyperlipidemia, unspecified: Secondary | ICD-10-CM

## 2020-05-30 DIAGNOSIS — I700 Atherosclerosis of aorta: Secondary | ICD-10-CM

## 2020-05-30 DIAGNOSIS — E1169 Type 2 diabetes mellitus with other specified complication: Secondary | ICD-10-CM

## 2020-05-30 NOTE — Progress Notes (Signed)
Yelina Sarratt    [id number]    January 17, 1956  Primary Care Physician:Johnson, Binnie Rail, MD Date of Appointment: 05/30/2020 Established Patient Visit  Chief complaint:  Cough, shortness of breath   HPI: Takeria Marquina de Marica Otter is a 65 y.o. woman spanish speaking who presents for follow up for shortness of breath initially felt to be asthma, now more concerning for hypersensitivity pneumonitis.   Interval Updates: Here for follow up for cough and shortness of breath felt to be secondary to asthma. Had PFTs today as well. Interpreter present. No further courses of prednisone. Continues to be short of breath.   Continues to have daily chronic cough.  She received the ILD questionnaire but wasn't able to fill it out. Continues to have cough. She feels the albuterol helps her more than the symbicort, but she was confused regarding her inhalers and thought she was suppsed to take the albuterol instead of symbicort.   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 She denies dyspnea at work.  She does have reflux and takes omeprazole for this.    Social History: [place] moved here around 2007. Never smoker, but did have indoor wood burning stove in childhood.  Has lived in the same house for 5 years. With her brother sister and a nephew. No one else has breathing problems at home. She has worked in the same job for the last 5 years. No pets at home. No chickens.  I have reviewed the patient's family social and past medical history and updated as appropriate.   Past Medical History:  Diagnosis Date  . Diabetes mellitus without complication (HCC)   . Hypertension     Past Surgical History:  Procedure Laterality Date  . ABDOMINAL HYSTERECTOMY      Family History  Problem Relation Age of Onset  . Hypertension Mother     Social History   Occupational History  . Not on file  Tobacco Use  . Smoking status: Passive Smoke Exposure - Never Smoker  .  Smokeless tobacco: Never Used  . Tobacco comment: spouse smoked in home X6-7 years.   Vaping Use  . Vaping Use: Never used  Substance and Sexual Activity  . Alcohol use: No  . Drug use: Not on file  . Sexual activity: Not on file     Physical Exam: Blood pressure 132/78, pulse 82, height 4\' 10"  (1.473 m), weight 124 lb (56.2 kg), SpO2 97 %.  Gen:      No acute distress Lungs:   Inspiratory sqwak bilaterally.  CV:         Regular rate and rhythm; no murmurs, rubs, or gallops.  No pedal edema   Data Reviewed: Imaging: I have personally reviewed the CT scan from Sept 2020 which shows mosaic attenuation. The ct chest in 2018 has diffuse GGOs which have improved in 2020.    PFTs:  PFT Results Latest Ref Rng & Units 03/23/2020 06/18/2017 11/22/2016  FVC-Pre L 1.20 1.57 1.73  FVC-Predicted Pre % 47 58 63  FVC-Post L 1.23 1.58 1.76  FVC-Predicted Post % 48 58 64  Pre FEV1/FVC % % 99 91 92  Post FEV1/FCV % % 97 92 91  FEV1-Pre L 1.18 1.42 1.59  FEV1-Predicted Pre % 61 68 76  FEV1-Post L 1.19 1.46 1.60  DLCO uncorrected ml/min/mmHg - 11.45 11.90  DLCO UNC% % - 65 67  DLCO corrected ml/min/mmHg - - 11.75  DLCO COR %Predicted % - -  67  DLVA Predicted % - 104 98  TLC L 2.51 2.99 2.81  TLC % Predicted % 60 69 65  RV % Predicted % 45 42 55   I have personally reviewed the patient's PFTs and they show moderate restriction to ventilation. This is worse when compared to PFTs in 2018-2019.   Labs: Labs reviewed - had autoimmune serologies negative in 2018. HP panel negative.   Immunization status: Immunization History  Administered Date(s) Administered  . Influenza,inj,Quad PF,6+ Mos 10/19/2016, 09/19/2017, 11/05/2018, 11/17/2019  . Moderna Sars-Covid-2 Vaccination 06/27/2019, 07/25/2019  . Pneumococcal Polysaccharide-23 10/19/2016  . Tdap 12/24/2016    Assessment:  Shortness of breath Interstitial Lung Disease - concerning for hypersensitivity pneumonitis.  GERD,  stable  Plan/Recommendations: I presented her case at ILD conference. Given worsening PFTs will proceed with repeat CT scan.  I have asked the interpreter to help her with ILD questionnaire today. Based on results of CT scan will likely proceed with bronchscopy with BAL and transbronchial biopsies.  Continue symbicort and albuterol for now. Continue PPI for reflux.  I spent 30 minutes in the care of this patient today including pre-charting, chart review, review of results, face-to-face care, coordination of care and communication with consultants etc.).   Return to Care: Return in about 3 months (around 08/30/2020).   Durel Salts, MD Pulmonary and Critical Care Medicine [facility] Office:[phone]

## 2020-05-30 NOTE — Patient Instructions (Addendum)
The patient should have follow up scheduled with myself in 3 months.   Prior to next visit patient should have CT Scan.  I will contact you to schedule the bronchoscopy after you have your CT scan.    Broncoscopia flexible Flexible Bronchoscopy  La broncoscopia flexible es un procedimiento utilizado para examinar las vas areas de los pulmones. Durante el procedimiento, se introduce un instrumento delgado y flexible con una cmara (broncoscopio) en la boca o nariz, se lo gua por la trquea Civil Service fast streamer a los conductos que transportan el aire a los pulmones (bronquios). Este instrumento permite al mdico examinar el interior de los pulmones y obtener muestras para analizarlas, si fuera necesario. Informe al mdico acerca de lo siguiente:  Cualquier alergia que tenga.  Todos los [employer] [place], incluidos vitaminas, hierbas, gotas oftlmicas, cremas y [address] de [address].  Problemas previos que usted o algn miembro de su familia hayan tenido con los anestsicos.  Cualquier trastorno de la sangre que tenga.  Cirugas a las que se haya sometido.  Cualquier afeccin mdica que tenga.  Si est embarazada o podra estarlo. Cules son los riesgos? En general, se trata de un procedimiento seguro. Sin embargo, pueden ocurrir complicaciones, por ejemplo:  Infeccin.  Sangrado.  Daos a otras estructuras u rganos.  Reacciones alrgicas a los medicamentos.  Pulmn colapsado (neumotrax).  Mayor necesidad de oxgeno o dificultad para respirar despus del procedimiento. Qu ocurre antes del procedimiento? Mantenerse hidratado Siga las instrucciones del mdico acerca de mantenerse hidratado, las cuales pueden incluir lo siguiente:  [employer] horas antes del procedimiento, puede beber lquidos transparentes, como agua, jugos de fruta sin pulpa, caf negro y t solo.   Restricciones en las comidas y bebidas Siga las instrucciones del mdico respecto de las  restricciones de comidas o bebidas, las cuales pueden incluir lo siguiente:  Ocho horas antes del procedimiento, deje de ingerir comidas o alimentos pesados, como carne, alimentos fritos o alimentos grasos.  Seis horas antes del procedimiento, deje de ingerir comidas o alimentos livianos, como tostadas o cereales.  Seis horas antes del procedimiento, deje de beber [place] o bebidas que [employer].  Dos horas antes del procedimiento, deje de beber lquidos transparentes. Medicamentos Consulte al mdico sobre:  Multimedia programmer o suspender los medicamentos que [place] habitualmente. Esto es muy importante si toma medicamentos para la diabetes o anticoagulantes.  Tomar medicamentos como aspirina e ibuprofeno. Estos medicamentos pueden tener un efecto anticoagulante en la [place]. No tome estos medicamentos a menos que el mdico se lo indique.  Usar medicamentos de venta libre, vitaminas, hierbas y suplementos. Indicaciones generales  Le podrn administrar antibiticos para ayudar a reducir el riesgo de infeccin.  Haga que un adulto responsable lo lleve a su casa desde el hospital o la clnica.  Si va a marcharse a su casa inmediatamente despus del procedimiento, pdale a un adulto responsable que lo cuide durante el tiempo que le indiquen. Esto es importante. Qu ocurre durante el procedimiento?  Le colocarn una va intravenosa (i.v.) en una vena.  Se le administrar un medicamento (anestesia local) para adormecer la boca, la nariz, la garganta y la laringe. Pueden administrarle uno o ms de los siguientes medicamentos: ? Un medicamento para ayudar a relajarse (sedante). ? Un medicamento para controlar la tos. ? Un medicamento para secar completamente los lquidos o secreciones de los pulmones.  Le introducirn un broncoscopio por la nariz o la boca [employer]. [employer].  Gretchen Short se  obtengan muestras de las secreciones de las vas respiratorias para Insurance account manager.  Si se observan zonas anmalas en las vas respiratorias, es posible que se tomen muestras de tejido y se las examine con un microscopio (biopsia).  Si fuera necesario tomar muestras de tejido de las porciones externas del pulmn, es posible que se use un tipo de Chiropractor (fluoroscopa) para guiar el broncoscopio hasta estas zonas.  Si hay hemorragia, quizs se utilice un medicamento para TEFL teacher o Warehouse manager. El procedimiento puede variar segn el mdico y el hospital. Qu puedo esperar despus del procedimiento?  Le controlarn la presin arterial, la frecuencia cardaca, la frecuencia respiratoria y Air cabin crew de oxgeno en la sangre hasta que le den el alta del hospital o la clnica.  Es posible que le hagan una radiografa de trax para detectar la presencia de un neumotrax.  No le permitirn comer ni beber nada durante las 2 horas posteriores al procedimiento.  Si le hicieron [employer], es su responsabilidad retirar los [place] del [place]. Pregntele a su mdico, o a un miembro del personal del departamento donde se realice el procedimiento, cundo estarn [employer].  Puede tener los siguientes sntomas durante 24 a 48 horas: ? Una tos peor que la que tena antes del procedimiento. ? Fiebre no muy alta. ? Dolor de garganta o voz ronca. ? Algo de [employer] mucosidad de los pulmones (esputo), si se Rowland Lathe biopsia. Siga estas instrucciones en su casa: Comida y bebida  No coma ni beba nada, incluso agua, durante las 2 horas posteriores al procedimiento, o hasta que desaparezca el efecto del medicamento anestsico. La anestesia de la garganta aumenta el riesgo de quemarse o ahogarse.  Comience a comer alimentos blandos y a beber lquido lentamente despus de que haya desaparecido el adormecimiento y hayan regresado los reflejos farngeos y de tos.  El da despus del procedimiento puede retomar su dieta normal. Conducir  Si le  administraron un sedante durante el procedimiento, puede afectarlo por varias horas. No conduzca ni opere maquinaria hasta que el mdico le indique que es seguro [place].  Pregntele al mdico si el medicamento recetado le impide conducir o usar [place].  Retome sus actividades normales segn lo indicado por el mdico. Pregntele al mdico qu actividades son seguras para usted. Indicaciones generales  Use los medicamentos de venta libre y los recetados solamente como se lo haya indicado el mdico.  No consuma ningn producto que contenga nicotina o tabaco. Estos productos incluyen cigarrillos, tabaco para Theatre manager y aparatos de vapeo, como los Administrator, Civil Service. Si necesita ayuda para dejar de consumir estos productos, consulte al [employer].  Cumpla con todas las visitas de seguimiento. Esto es importante.   Solicite ayuda de inmediato si:  La falta de aire empeora.  Se siente mareado o como si se fuera a desmayar.  Siente dolor en el pecho.  Cuando tose, expulsa ms que una pequea cantidad de [place]. Estos sntomas pueden representar un problema grave que constituye Radio broadcast assistant. No espere a ver si los sntomas desaparecen. Solicite atencin mdica de inmediato. Comunquese con el servicio de emergencias de su localidad (911 en los [place]). No conduzca por sus propios medios [employer] hospital. Resumen  La broncoscopia flexible es un procedimiento que le permite al mdico examinar detenidamente el interior de los pulmones y obtener muestras para analizarlas, si fuera necesario.  Algunos riesgos de la broncoscopia flexible son hemorragia, infeccin y pulmn colapsado (neumotrax).  Antes del procedimiento,  se Arts administrator un medicamento para adormecer la boca, la nariz, la garganta y la laringe. Luego, le introducirn un broncoscopio por la Clinical cytogeneticist o la boca [employer].  Despus del procedimiento, le controlarn la presin arterial, la frecuencia cardaca, la  frecuencia respiratoria y Air cabin crew de oxgeno en la sangre hasta que le den el alta del hospital o la clnica. Es posible que le hagan una radiografa de trax para detectar la presencia de un neumotrax.  No le permitirn comer ni beber nada durante las 2 horas posteriores al procedimiento. Esta informacin no tiene Theme park manager el consejo del mdico. Asegrese de hacerle al mdico cualquier pregunta que tenga. Document Revised: 09/23/2019 Document Reviewed: 09/23/2019 Elsevier Patient Education  2021 [employer].

## 2020-06-03 ENCOUNTER — Ambulatory Visit (HOSPITAL_COMMUNITY)
Admission: RE | Admit: 2020-06-03 | Discharge: 2020-06-03 | Disposition: A | Discharge status: Home / Self Care | Payer: Self-pay | Source: Ambulatory Visit | Attending: Internal Medicine | Admitting: Internal Medicine

## 2020-06-03 ENCOUNTER — Other Ambulatory Visit: Payer: Self-pay

## 2020-06-03 DIAGNOSIS — J849 Interstitial pulmonary disease, unspecified: Secondary | ICD-10-CM | POA: Insufficient documentation

## 2020-06-08 ENCOUNTER — Ambulatory Visit: Payer: No Typology Code available for payment source | Admitting: Nurse Practitioner

## 2020-06-08 ENCOUNTER — Other Ambulatory Visit: Payer: Self-pay

## 2020-06-08 MED FILL — Albuterol Sulfate Inhal Aero 108 MCG/ACT (90MCG Base Equiv): RESPIRATORY_TRACT | 25 days supply | Qty: 18 | Fill #0 | Status: CN

## 2020-06-08 MED FILL — Albuterol Sulfate Inhal Aero 108 MCG/ACT (90MCG Base Equiv): RESPIRATORY_TRACT | 16 days supply | Qty: 18 | Fill #0 | Status: AC

## 2020-06-23 ENCOUNTER — Ambulatory Visit: Payer: No Typology Code available for payment source | Admitting: Physician Assistant

## 2020-06-29 ENCOUNTER — Telehealth: Payer: Self-pay | Admitting: Internal Medicine

## 2020-06-29 DIAGNOSIS — J679 Hypersensitivity pneumonitis due to unspecified organic dust: Secondary | ICD-10-CM

## 2020-06-29 MED ORDER — SULFAMETHOXAZOLE-TRIMETHOPRIM 800-160 MG PO TABS: 1.0000 | ORAL_TABLET | ORAL | 2 refills | Status: DC

## 2020-06-29 MED ORDER — PREDNISONE 20 MG PO TABS: 20.0000 mg | ORAL_TABLET | Freq: Every day | ORAL | 2 refills | Status: DC

## 2020-06-29 NOTE — Telephone Encounter (Signed)
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  Please call patient and let her know I have reviewed her scan and it shows some signs of inflammation. Would like to start start prednisone 20 mg once daily with bactrim Monday, Wednesday Friday as prescribed.  We are not going to do a bronchoscopy at this time. (even though this was previously discussed,) because it would not change our treatment plans.   Please schedule her for follow up to see me in the next 4-6 weeks.

## 2020-06-30 NOTE — Telephone Encounter (Signed)
Called the pt via pacific interpreters [phone] ID) had to [facility] and will hold in triage to try again.

## 2020-07-01 NOTE — Telephone Encounter (Signed)
Called pt via PC [phone]) and relayed the results and plan per Dr Celine Mans  Appt was scheduled for 08/02/20  Rx for pred and bactrim already sent

## 2020-07-06 ENCOUNTER — Other Ambulatory Visit: Payer: Self-pay | Admitting: Internal Medicine

## 2020-07-06 ENCOUNTER — Other Ambulatory Visit: Payer: Self-pay

## 2020-07-06 ENCOUNTER — Ambulatory Visit: Payer: Self-pay | Attending: Nurse Practitioner | Admitting: Family Medicine

## 2020-07-06 ENCOUNTER — Encounter: Payer: Self-pay | Admitting: Family Medicine

## 2020-07-06 VITALS — BP 134/71 | HR 61 | Ht <= 58 in | Wt 125.0 lb

## 2020-07-06 DIAGNOSIS — I10 Essential (primary) hypertension: Secondary | ICD-10-CM

## 2020-07-06 DIAGNOSIS — J849 Interstitial pulmonary disease, unspecified: Secondary | ICD-10-CM

## 2020-07-06 DIAGNOSIS — K219 Gastro-esophageal reflux disease without esophagitis: Secondary | ICD-10-CM

## 2020-07-06 DIAGNOSIS — E785 Hyperlipidemia, unspecified: Secondary | ICD-10-CM

## 2020-07-06 DIAGNOSIS — E781 Pure hyperglyceridemia: Secondary | ICD-10-CM

## 2020-07-06 DIAGNOSIS — E1142 Type 2 diabetes mellitus with diabetic polyneuropathy: Secondary | ICD-10-CM

## 2020-07-06 DIAGNOSIS — E1169 Type 2 diabetes mellitus with other specified complication: Secondary | ICD-10-CM

## 2020-07-06 LAB — POCT GLYCOSYLATED HEMOGLOBIN (HGB A1C): HbA1c, POC (controlled diabetic range): 6.4 % (ref 0.0–7.0)

## 2020-07-06 MED ORDER — OMEPRAZOLE 20 MG PO CPDR
20.0000 mg | DELAYED_RELEASE_CAPSULE | Freq: Every day | ORAL | 6 refills | Status: DC
  Filled 2020-07-06: qty 30, 30d supply, fill #0
  Filled 2020-08-05: qty 30, 30d supply, fill #1
  Filled 2020-09-21: qty 30, 30d supply, fill #2
  Filled 2020-10-26: qty 30, 30d supply, fill #3
  Filled 2020-12-15: qty 30, 30d supply, fill #4

## 2020-07-06 MED ORDER — ICOSAPENT ETHYL 1 G PO CAPS
ORAL_CAPSULE | Freq: Two times a day (BID) | ORAL | 1 refills | Status: DC
  Filled 2020-07-06: qty 120, 30d supply, fill #0

## 2020-07-06 MED ORDER — AMLODIPINE BESYLATE 10 MG PO TABS
ORAL_TABLET | Freq: Every day | ORAL | 6 refills | Status: DC
  Filled 2020-07-06: qty 30, 30d supply, fill #0
  Filled 2020-09-21: qty 30, 30d supply, fill #1
  Filled 2020-10-26: qty 30, 30d supply, fill #2
  Filled 2020-12-05: qty 30, 30d supply, fill #3
  Filled 2021-01-19: qty 30, 30d supply, fill #4
  Filled 2021-02-15: qty 30, 30d supply, fill #0
  Filled 2021-03-17: qty 30, 30d supply, fill #1

## 2020-07-06 MED FILL — Albuterol Sulfate Inhal Aero 108 MCG/ACT (90MCG Base Equiv): RESPIRATORY_TRACT | 16 days supply | Qty: 18 | Fill #1 | Status: CN

## 2020-07-06 MED FILL — Albuterol Sulfate Inhal Aero 108 MCG/ACT (90MCG Base Equiv): RESPIRATORY_TRACT | 16 days supply | Qty: 18 | Fill #1 | Status: AC

## 2020-07-06 MED FILL — Losartan Potassium Tab 25 MG: ORAL | 30 days supply | Qty: 30 | Fill #0 | Status: AC

## 2020-07-06 NOTE — Patient Instructions (Signed)
Fibrosis pulmonar Pulmonary Fibrosis  La fibrosis pulmonar es un tipo de enfermedad que causa cicatrices. Con el tiempo, el tejido cicatricial se acumula en los sacos de aire de los pulmones (alvolos). Esto dificulta la respiracin. Puede ingresar menos oxgeno a Risk manager. Las cicatrices de la fibrosis pulmonar empeoran con el tiempo. Este dao es [place] y puede conducir a otros problemas de salud graves. Cules son las causas? Hay muchas causas diferentes de fibrosis pulmonar. Algunas veces, la causa no se conoce. En tales casos, se denomina fibrosis pulmonar idioptica. Otras causas son:  Exposicin a determinadas sustancias y productos qumicos asociados con la agricultura o el trabajo en [place], una construccin o una fbrica. Por ejemplo, moho, amianto, slice, polvos de metales y vapores txicos.  Sarcoidosis. En esta enfermedad, que suele afectar los pulmones, se forman reas de clulas inflamatorias (granulomas).  Enfermedades autoinmunitarias. Entre ellas, se incluyen la artritis reumatoide, la esclerosis sistmica o las enfermedades del tejido conjuntivo.  Tomar ciertos medicamentos. Estos incluyen los medicamentos que se usan en radioterapia o en el tratamiento de convulsiones, problemas cardacos y algunas infecciones. Qu incrementa el riesgo? Es ms probable que usted sufra esta afeccin si:  Tiene antecedentes familiares de la enfermedad.  Es Neomia Dear persona de edad avanzada. La afeccin es ms frecuente en los [employer].  Tiene antecedentes de tabaquismo.  Tiene un trabajo que lo expone a ciertas sustancias qumicas.  Tiene enfermedad de reflujo gastroesofgico (ERGE). Cules son los signos o los sntomas? Los sntomas de esta afeccin [employer] siguientes:  Dificultad para respirar, que empeora con la [place].  Falta de aire (disnea).  Tos seca, persistente.  Respiracin rpida y superficial durante el ejercicio o al hacer reposo.  Piel y  labios azulados.  Prdida del apetito.  Debilidad.  Prdida de peso y [place].  Puntas de los dedos redondeadas y de mayor tamao (dedos en palillo de tambor). Cmo se diagnostica? Esta afeccin se puede diagnosticar en funcin de lo siguiente:  Los sntomas y antecedentes mdicos.  Un examen fsico. Tambin pueden hacerle estudios, que incluyen los siguientes:  Un examen que implica observar el interior de los pulmones con un instrumento (broncoscopia).  Estudios de diagnstico por imgenes de los pulmones y Insurance underwriter.  Estudios para verificar si est respirando bien (pruebas de la funcin pulmonar).  Anlisis de [place].  Pruebas para determinar cmo funcionan los pulmones mientras camina (ergometra pulmonar).  Un procedimiento para extirpar Lauris Poag de tejido pulmonar para examinar con un microscopio (biopsia). Cmo se trata? La fibrosis pulmonar no tiene [place]. El tratamiento se centra en controlar los sntomas y evitar que el tejido cicatricial empeore. Esto puede incluir lo siguiente:  Medicamentos, por ejemplo: ? Corticoesteroides para evitar cambios permanentes en el pulmn. ? Medicamentos para Engineer, agricultural de defensa (sistema inmunitario) del cuerpo. ? Medicamentos para ayudar con la funcin pulmonar al reducir la inflamacin o la formacin de tejido cicatricial.  Controles peridicos con radiografas y anlisis de laboratorio.  Oxigenoterapia.  Rehabilitacin pulmonar.  Ciruga. En algunos casos, es posible un trasplante de pulmn. Siga estas indicaciones en su casa: Medicamentos  Tome los medicamentos de venta libre y los recetados solamente como se lo haya indicado el mdico.  Mantngase al da con las vacunas como se lo haya recomendado el mdico. Instrucciones generales  No consuma ningn producto que contenga nicotina o tabaco, como cigarrillos y Administrator, Civil Service. Si necesita ayuda para dejar de fumar, consulte al mdico.  Haga  ejercicio de forma regular,  pero no haga esfuerzos excesivos. Pdale al mdico que le sugiera algunas actividades que sean seguras para usted. ? Si tiene limitaciones fsicas, puede hacer ejercicio caminando, usando una bicicleta fija o realizando ejercicios en una silla. ? Consulte a su mdico acerca de la necesidad de usar oxgeno [employer] ejercicios.  Si est expuesto a sustancias y productos qumicos en el trabajo, asegrese de usar una mscara o un respirador en todo momento.  nase a un programa de rehabilitacin pulmonar o a un grupo de apoyo para personas con fibrosis pulmonar.  Coma porciones pequeas varias veces al da, para no llenarse demasiado. [place] en exceso puede dificultar ms la respiracin.  Mantenga un peso saludable. Pierda peso si lo necesita.  Haga ejercicios de Pharmacist, community se lo haya indicado el mdico.  Concurra a todas las visitas de control como se lo haya indicado el mdico. Esto es importante.      Comunquese con un mdico si:  Tiene sntomas que no mejoran con los medicamentos.  No puede mantener su nivel de actividad habitual.  Tiene dificultad para respirar profundo.  Tiene fiebre o escalofros.  Los labios o la piel se le ponen de [employer].  Tiene las puntas de los dedos redondeadas y de mayor tamao. Solicite ayuda inmediatamente si:  Los sntomas empeoran de [place] repentina.  Siente dolor en el pecho.  Tose y expectora una mucosidad de color oscuro.  Tiene muchos dolores de [place].  Est muy confundido o somnoliento. Resumen  La fibrosis pulmonar es un tipo de enfermedad pulmonar que causa que, con el [place], se acumule tejido cicatricial en los sacos de aire de los pulmones (alvolos). Puede ingresar menos oxgeno a Risk manager. Esto dificulta la respiracin.  Las cicatrices de la fibrosis pulmonar empeoran con el tiempo. Este dao es [place] y puede conducir a otros problemas de salud graves.  Una persona es  ms propensa a sufrir esta afeccin si tiene antecedentes familiares de la afeccin o un trabajo que la expone a ciertas sustancias qumicas.  La fibrosis pulmonar no tiene [place]. El tratamiento se centra en controlar los sntomas y evitar que el tejido cicatricial empeore. Esta informacin no tiene Theme park manager el consejo del mdico. Asegrese de hacerle al mdico cualquier pregunta que tenga. Document Revised: 03/15/2017 Document Reviewed: 03/15/2017 Elsevier Patient Education  2021 [employer].

## 2020-07-06 NOTE — Progress Notes (Signed)
Subjective:  Patient ID: Tammy Osborne, female    DOB: July 17, 1955  Age: 65 y.o. MRN: [id number]  CC: Diabetes   HPI Tammy Osborne is a 65 year old female with a history of type 2 diabetes mellitus (A1c 6.4), interstitial pulmonary fibrosis, hyperlipidemia, hypertension here for chronic disease management  Interval History:  She is not up to date on annual eye exams.  Denies presence of visual concerns, numbness in extremities or hypoglycemia and has been compliant with metformin. She is also on statin and Vascepa (of note her last lipid panel from 08/2019 was uncontrolled with severely elevated triglycerides of 787).  Pulmonary fibrosis is followed by [facility] and she recently had a CT chest which revealed: IMPRESSION: 1. Chronic diffuse patchy ground-glass opacity in both lungs with relative sparing of the lung bases, worsened since 10/10/2018 chest CT. Prominent patchy air trapping, indicative of small airways disease. Findings are compatible with chronic hypersensitivity pneumonitis, as previously described on imaging studies and as clinically suspected. Findings are suggestive of an alternative diagnosis (not UIP) per consensus guidelines: Diagnosis of Idiopathic Pulmonary Fibrosis: An Official ATS/ERS/JRS/ALAT Clinical Practice Guideline. Alexis, Iss 5, [phone], Sep 29 2016. 2. New mild subcarinal lymphadenopathy, nonspecific, possibly reactive. 3. Three-vessel coronary atherosclerosis. 4. Small hiatal hernia. 5. Aortic Atherosclerosis (ICD10-I70.0).   She asked if I would explain the results of her CT chest to her as she did not understand when her pulmonary clinic called her with results.  She does not fully grasp her lung condition.  Endorses intermittent coughing and intermittent dyspnea but has no chest pain at this time. Past Medical History:  Diagnosis Date  . Diabetes mellitus without complication ([place])   .  Hypertension     Past Surgical History:  Procedure Laterality Date  . ABDOMINAL HYSTERECTOMY      Family History  Problem Relation Age of Onset  . Hypertension Mother     No Known Allergies  Outpatient Medications Prior to Visit  Medication Sig Dispense Refill  . albuterol (VENTOLIN HFA) 108 (90 Base) MCG/ACT inhaler INHALE 2 PUFFS INTO THE LUNGS EVERY 4 (FOUR) HOURS AS NEEDED. 18 g 11  . aspirin EC 81 MG tablet Take 1 tablet (81 mg total) by mouth daily. 100 tablet 1  . atorvastatin (LIPITOR) 80 MG tablet TAKE 1 TABLET (80 MG TOTAL) BY MOUTH DAILY. 90 tablet 1  . budesonide-formoterol (SYMBICORT) 80-4.5 MCG/ACT inhaler Inhale 2 puffs into the lungs 2 (two) times daily. 10.2 g 12  . carvedilol (COREG) 12.5 MG tablet TAKE 1 TABLET (12.5 MG TOTAL) BY MOUTH 2 (TWO) TIMES DAILY WITH A MEAL. 60 tablet 4  . diclofenac sodium (VOLTAREN) 1 % GEL Apply 2 g topically 4 (four) times daily. 100 g 1  . gabapentin (NEURONTIN) 100 MG capsule Take 2 capsules (200 mg total) by mouth at bedtime. 60 capsule 3  . losartan (COZAAR) 25 MG tablet TAKE 1 TABLET (25 MG TOTAL) BY MOUTH DAILY. 30 tablet 6  . metFORMIN (GLUCOPHAGE) 500 MG tablet TAKE 1 TABLET (500 MG TOTAL) BY MOUTH DAILY WITH BREAKFAST. 90 tablet 3  . omeprazole (PRILOSEC) 20 MG capsule Take 1 capsule (20 mg total) by mouth daily. 30 capsule 6  . predniSONE (DELTASONE) 20 MG tablet Take 1 tablet (20 mg total) by mouth daily with breakfast. 30 tablet 2  . sulfamethoxazole-trimethoprim (BACTRIM DS) 800-160 MG tablet Take 1 tablet by mouth every Monday, Wednesday, and Friday. 12 tablet  2  . amLODipine (NORVASC) 10 MG tablet TAKE 1 TABLET (10 MG TOTAL) BY MOUTH DAILY. 30 tablet 6  . icosapent Ethyl (VASCEPA) 1 g capsule TAKE 2 CAPSULES (2 G TOTAL) BY MOUTH 2 (TWO) TIMES DAILY. 180 capsule 1   No facility-administered medications prior to visit.     ROS Review of Systems  Constitutional: Negative for activity change, appetite change and  fatigue.  HENT: Negative for congestion, sinus pressure and sore throat.   Eyes: Negative for visual disturbance.  Respiratory: Positive for cough. Negative for chest tightness, shortness of breath and wheezing.   Cardiovascular: Negative for chest pain and palpitations.  Gastrointestinal: Negative for abdominal distention, abdominal pain and constipation.  Endocrine: Negative for polydipsia.  Genitourinary: Negative for dysuria and frequency.  Musculoskeletal: Negative for arthralgias and back pain.  Skin: Negative for rash.  Neurological: Negative for tremors, light-headedness and numbness.  Hematological: Does not bruise/bleed easily.  Psychiatric/Behavioral: Negative for agitation and behavioral problems.    Objective:  BP 134/71   Pulse 61   Ht $R'4\' 10"'dH$  (1.473 m)   Wt 125 lb (56.7 kg)   LMP  (LMP Unknown)   SpO2 94%   BMI 26.13 kg/m   BP/Weight 07/06/2020 05/30/2020 05/17/6220  Systolic BP 979 892 119  Diastolic BP 71 78 78  Wt. (Lbs) 125 124 125  BMI 26.13 25.92 26.13      Physical Exam Constitutional:      Appearance: She is well-developed.  Neck:     Vascular: No JVD.  Cardiovascular:     Rate and Rhythm: Normal rate.     Heart sounds: Normal heart sounds. No murmur heard.   Pulmonary:     Effort: Pulmonary effort is normal.     Breath sounds: Normal breath sounds. No wheezing or rales.  Chest:     Chest wall: No tenderness.  Abdominal:     General: Bowel sounds are normal. There is no distension.     Palpations: Abdomen is soft. There is no mass.     Tenderness: There is no abdominal tenderness.  Musculoskeletal:        General: Normal range of motion.     Right lower leg: No edema.     Left lower leg: No edema.  Neurological:     Mental Status: She is alert and oriented to person, place, and time.  Psychiatric:        Mood and Affect: Mood normal.     CMP Latest Ref Rng & Units 02/03/2019 08/15/2017 09/06/2016  Glucose 65 - 99 mg/dL 96 120(H) 90  BUN 8  - 27 mg/dL $Remove'10 20 22  '[id number]$ Creatinine 0.57 - 1.00 mg/dL 0.68 0.70 0.84  Sodium 134 - 144 mmol/L 143 142 143  Potassium 3.5 - 5.2 mmol/L 4.4 3.5 4.6  Chloride 96 - 106 mmol/L 104 107 105  CO2 20 - 29 mmol/L 24 21(L) 22  Calcium 8.7 - 10.3 mg/dL 10.0 9.4 10.0  Total Protein 6.0 - 8.5 g/dL 7.2 8.1 7.4  Total Bilirubin 0.0 - 1.2 mg/dL 0.3 0.2(L) 0.2  Alkaline Phos 39 - 117 IU/L 184(H) 126 143(H)  AST 0 - 40 IU/L 18 40 23  ALT 0 - 32 IU/L 17 44 18    Lipid Panel     Component Value Date/Time   CHOL 275 (H) 09/21/2019 1210   TRIG 787 (HH) 09/21/2019 1210   HDL 36 (L) 09/21/2019 1210   CHOLHDL 7.6 (H) 09/21/2019 1210   LDLCALC 102 (H)  09/21/2019 1210    CBC    Component Value Date/Time   WBC 13.7 (H) 09/21/2019 1210   WBC 29.8 (H) 08/15/2017 0611   RBC 5.79 (H) 09/21/2019 1210   RBC 5.40 (H) 08/15/2017 0611   HGB 15.5 09/21/2019 1210   HCT 46.5 09/21/2019 1210   PLT 423 02/03/2019 0924   MCV 80 09/21/2019 1210   MCH 26.8 09/21/2019 1210   MCH 27.0 08/15/2017 0611   MCHC 33.3 09/21/2019 1210   MCHC 32.8 08/15/2017 0611   RDW 17.2 (H) 09/21/2019 1210   LYMPHSABS 5.0 (H) 09/21/2019 1210   MONOABS 0.9 10/15/2016 1553   EOSABS 0.0 09/21/2019 1210   BASOSABS 0.1 09/21/2019 1210    Lab Results  Component Value Date   HGBA1C 6.4 07/06/2020    Assessment & Plan:  1. Controlled type 2 diabetes mellitus with diabetic polyneuropathy, without long-term current use of insulin (HCC) Controlled with A1c of 6.4 Continue current regimen Counseled on Diabetic diet, my plate method, 528 minutes of moderate intensity exercise/week Blood sugar logs with fasting goals of 80-120 mg/dl, random of less than 180 and in the event of sugars less than 60 mg/dl or greater than 400 mg/dl encouraged to notify the clinic. Advised on the need for annual eye exams, annual foot exams, Pneumonia vaccine. - POCT glycosylated hemoglobin (Hb A1C) - CMP14+EGFR; Future - Lipid panel; Future  2. Essential  hypertension Controlled Continue Amlodipine Counseled on blood pressure goal of less than 130/80, low-sodium, DASH diet, medication compliance, 150 minutes of moderate intensity exercise per week. Discussed medication compliance, adverse effects. - amLODipine (NORVASC) 10 MG tablet; TAKE 1 TABLET (10 MG TOTAL) BY MOUTH DAILY.  Dispense: 30 tablet; Refill: 6  3. ILD (interstitial lung disease) ([place]) On chronic prednisone and Bactrim Follow-up with pulmonary  4. Hyperlipidemia associated with type 2 diabetes mellitus ([place]) Uncontrolled I have ordered lipid panel to follow-up on this Continue atorvastatin Low-cholesterol diet  5. Hypertriglyceridemia Uncontrolled Currently on Vascepa    Meds ordered this encounter  Medications  . amLODipine (NORVASC) 10 MG tablet    Sig: TAKE 1 TABLET (10 MG TOTAL) BY MOUTH DAILY.    Dispense:  30 tablet    Refill:  6  . icosapent Ethyl (VASCEPA) 1 g capsule    Sig: TAKE 2 CAPSULES (2 G TOTAL) BY MOUTH 2 (TWO) TIMES DAILY.    Dispense:  180 capsule    Refill:  1    Follow-up: Return in about 3 months (around 10/06/2020) for PCP Dr. Wynetta Emery for chronic medical conditions.Charlott Rakes, MD, FAAFP. [facility] and [place], [place]   07/06/2020, 11:34 AM

## 2020-07-21 ENCOUNTER — Other Ambulatory Visit: Payer: Self-pay

## 2020-07-21 ENCOUNTER — Ambulatory Visit: Payer: Self-pay | Attending: Internal Medicine

## 2020-07-21 DIAGNOSIS — E1142 Type 2 diabetes mellitus with diabetic polyneuropathy: Secondary | ICD-10-CM

## 2020-07-21 MED FILL — Atorvastatin Calcium Tab 80 MG (Base Equivalent): ORAL | 30 days supply | Qty: 30 | Fill #1 | Status: AC

## 2020-07-22 ENCOUNTER — Other Ambulatory Visit: Payer: Self-pay | Admitting: Family Medicine

## 2020-07-22 ENCOUNTER — Other Ambulatory Visit: Payer: Self-pay

## 2020-07-22 LAB — CMP14+EGFR
ALT: 32 IU/L (ref 0–32)
AST: 19 IU/L (ref 0–40)
Albumin/Globulin Ratio: 1.8 (ref 1.2–2.2)
Albumin: 4.6 g/dL (ref 3.8–4.8)
Alkaline Phosphatase: 125 IU/L — ABNORMAL HIGH (ref 44–121)
BUN/Creatinine Ratio: 17 (ref 12–28)
BUN: 15 mg/dL (ref 8–27)
Bilirubin Total: 0.3 mg/dL (ref 0.0–1.2)
CO2: 26 mmol/L (ref 20–29)
Calcium: 9.9 mg/dL (ref 8.7–10.3)
Chloride: 99 mmol/L (ref 96–106)
Creatinine, Ser: 0.88 mg/dL (ref 0.57–1.00)
Globulin, Total: 2.5 g/dL (ref 1.5–4.5)
Glucose: 83 mg/dL (ref 65–99)
Potassium: 4.2 mmol/L (ref 3.5–5.2)
Sodium: 141 mmol/L (ref 134–144)
Total Protein: 7.1 g/dL (ref 6.0–8.5)
eGFR: 73 mL/min/{1.73_m2} (ref >59)

## 2020-07-22 LAB — LIPID PANEL
Chol/HDL Ratio: 3.7 ratio (ref 0.0–4.4)
Cholesterol, Total: 263 mg/dL — ABNORMAL HIGH (ref 100–199)
HDL: 72 mg/dL (ref >39)
LDL Chol Calc (NIH): 149 mg/dL — ABNORMAL HIGH (ref 0–99)
Triglycerides: 234 mg/dL — ABNORMAL HIGH (ref 0–149)
VLDL Cholesterol Cal: 42 mg/dL — ABNORMAL HIGH (ref 5–40)

## 2020-07-22 MED ORDER — ROSUVASTATIN CALCIUM 20 MG PO TABS
20.0000 mg | ORAL_TABLET | Freq: Every day | ORAL | 1 refills | Status: DC
  Filled 2020-07-22 – 2020-07-29 (×2): qty 30, 30d supply, fill #0

## 2020-07-26 ENCOUNTER — Ambulatory Visit: Payer: No Typology Code available for payment source | Admitting: Pulmonary Disease

## 2020-07-29 ENCOUNTER — Other Ambulatory Visit: Payer: Self-pay

## 2020-08-02 ENCOUNTER — Other Ambulatory Visit: Payer: Self-pay

## 2020-08-02 ENCOUNTER — Encounter: Payer: Self-pay | Admitting: Internal Medicine

## 2020-08-02 ENCOUNTER — Ambulatory Visit (INDEPENDENT_AMBULATORY_CARE_PROVIDER_SITE_OTHER): Payer: No Typology Code available for payment source | Admitting: Internal Medicine

## 2020-08-02 ENCOUNTER — Other Ambulatory Visit: Payer: No Typology Code available for payment source

## 2020-08-02 VITALS — BP 138/80 | HR 76 | Temp 97.6°F | Wt 127.0 lb

## 2020-08-02 DIAGNOSIS — J679 Hypersensitivity pneumonitis due to unspecified organic dust: Secondary | ICD-10-CM

## 2020-08-02 MED ORDER — SULFAMETHOXAZOLE-TRIMETHOPRIM 800-160 MG PO TABS
1.0000 | ORAL_TABLET | ORAL | 3 refills | Status: DC
  Filled 2020-08-02: qty 12, 28d supply, fill #0
  Filled 2020-09-21: qty 12, 28d supply, fill #1
  Filled 2020-10-13: qty 12, 28d supply, fill #2

## 2020-08-02 MED ORDER — PREDNISONE 20 MG PO TABS
20.0000 mg | ORAL_TABLET | Freq: Every day | ORAL | 3 refills | Status: DC
  Filled 2020-08-02: qty 30, 30d supply, fill #0
  Filled 2020-09-21: qty 30, 30d supply, fill #1
  Filled 2020-10-26: qty 30, 30d supply, fill #2
  Filled 2021-01-19: qty 30, 30d supply, fill #3

## 2020-08-02 NOTE — Progress Notes (Signed)
Oberia Beaudoin    [id number]    [birth year]  Primary Care Physician:Johnson, Binnie Rail, MD Date of Appointment: 05/30/2020 Established Patient Visit  Chief complaint:  Cough, shortness of breath   HPI: Kayona Foor de Marica Otter is a 65 y.o. woman spanish speaking who presents for hypersensitivity pneumonitis.  Interpreter present.   Interval Updates: Here for follow up after starting prednisone 20 mg with bactrim  MWF. Dramatic resolution of symptoms. Still taking symbicort BID. Minimal albuterol use.  Having some weight gain since starting steroids. Concerned about side effects.   Social History: [place] here around [place]. Never smoker, but did have indoor wood burning stove in childhood.  Has lived in the same house for 5 years which is when  her symptoms started. With her brother sister and a nephew. No one else has breathing problems at home. She has worked in the same job for the last 5 years. No pets at home. No chickens or birds. Works at [employer] in [place]. 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 No musical instruments. No visible mold. Does not use down feather pillows. Has mice in her home, but no bats.   I have reviewed the patient's family social and past medical history and updated as appropriate.   Past Medical History:  Diagnosis Date   Diabetes mellitus without complication (HCC)    Hypertension     Past Surgical History:  Procedure Laterality Date   ABDOMINAL HYSTERECTOMY      Family History  Problem Relation Age of Onset   Hypertension Mother     Social History   Occupational History   Not on file  Tobacco Use   Smoking status: Passive Smoke Exposure - Never Smoker   Smokeless tobacco: Never Used   Tobacco comment: spouse smoked in home X6-7 years.   Vaping Use   Vaping Use: Never used  Substance and Sexual Activity   Alcohol use: No   Drug use: Not on file   Sexual  activity: Not on file     Physical Exam: Blood pressure 132/78, pulse 82, height 4\' 10"  (1.473 m), weight 124 lb (56.2 kg), SpO2 97 %.  Gen:      No acute distress Lungs:   Inspiratory sqwak bilaterally.  CV:         Regular rate and rhythm; no murmurs, rubs, or gallops.  No pedal edema   Data Reviewed: Imaging: I have personally reviewed the CT scan from Sept 2020 which shows mosaic attenuation. The ct chest in 2018 has diffuse GGOs which have improved in 2020.    PFTs:  PFT Results Latest Ref Rng & Units 03/23/2020 06/18/2017 11/22/2016  FVC-Pre L 1.20 1.57 1.73  FVC-Predicted Pre % 47 58 63  FVC-Post L 1.23 1.58 1.76  FVC-Predicted Post % 48 58 64  Pre FEV1/FVC % % 99 91 92  Post FEV1/FCV % % 97 92 91  FEV1-Pre L 1.18 1.42 1.59  FEV1-Predicted Pre % 61 68 76  FEV1-Post L 1.19 1.46 1.60  DLCO uncorrected ml/min/mmHg - 11.45 11.90  DLCO UNC% % - 65 67  DLCO corrected ml/min/mmHg - - 11.75  DLCO COR %Predicted % - - 67  DLVA Predicted % - 104 98  TLC L 2.51 2.99 2.81  TLC % Predicted % 60 69 65  RV % Predicted % 45 42 55   I have personally reviewed the patient's PFTs  and they show moderate restriction to ventilation. This is worse when compared to PFTs in 2018-2019.   Labs: Labs reviewed - had autoimmune serologies negative in 2018. HP panel negative.   Immunization status: Immunization History  Administered Date(s) Administered   Influenza,inj,Quad PF,6+ Mos 10/19/2016, 09/19/2017, 11/05/2018, 11/17/2019   Moderna Sars-Covid-2 Vaccination 06/27/2019, 07/25/2019   Pneumococcal Polysaccharide-23 10/19/2016   Tdap 12/24/2016    Assessment:  Subacute Hypersensitivity Pneumonitis GERD, stable  Plan/Recommendations:  Continue prednisone 20 mg daily with bactrim MWF.  Will re-send HP panel.  May need steroid sparing therapy at follow up. I am concerned she still has ongoing exposure given her dramatic improvement with prednisone. ILD questionnaire otherwise  non-revealing. She suspects her work environment but her symptoms started after she moved into her current home.  Continue symbicort and albuterol for now. Continue PPI for reflux.   I spent 32 minutes in the care of this patient today including pre-charting, chart review, review of results, face-to-face care, coordination of care and communication with consultants etc.).   Return to Care: Return in about 3 months (around 08/30/2020).   Durel Salts, MD Pulmonary and Critical Care Medicine [facility] [phone]

## 2020-08-02 NOTE — Patient Instructions (Signed)
I will see you back in 3 months.   Keep taking prednisone 20 mg daily until then. Keep taking bactrim Monday Wednesday Friday  Get blood work today.   Continue symbicort inhaler.

## 2020-08-05 ENCOUNTER — Other Ambulatory Visit: Payer: Self-pay

## 2020-08-05 LAB — HYPERSENSITIVITY PNEUMONITIS
A. Pullulans Abs: NEGATIVE
A.Fumigatus #1 Abs: NEGATIVE
Micropolyspora faeni, IgG: NEGATIVE
Pigeon Serum Abs: NEGATIVE
Thermoact. Saccharii: NEGATIVE
Thermoactinomyces vulgaris, IgG: NEGATIVE

## 2020-08-09 ENCOUNTER — Other Ambulatory Visit: Payer: Self-pay | Admitting: Internal Medicine

## 2020-08-09 ENCOUNTER — Other Ambulatory Visit: Payer: Self-pay

## 2020-08-09 DIAGNOSIS — I10 Essential (primary) hypertension: Secondary | ICD-10-CM

## 2020-08-09 MED ORDER — LOSARTAN POTASSIUM 25 MG PO TABS
ORAL_TABLET | Freq: Every day | ORAL | 6 refills | Status: DC
  Filled 2020-08-09 – 2020-10-13 (×2): qty 30, 30d supply, fill #0
  Filled 2020-12-05: qty 30, 30d supply, fill #1
  Filled 2021-01-19: qty 30, 30d supply, fill #2
  Filled 2021-03-17: qty 30, 30d supply, fill #0
  Filled 2021-05-01: qty 30, 30d supply, fill #1
  Filled 2021-06-02: qty 30, 30d supply, fill #2
  Filled 2021-07-17: qty 30, 30d supply, fill #3

## 2020-08-16 ENCOUNTER — Other Ambulatory Visit: Payer: Self-pay

## 2020-09-15 ENCOUNTER — Other Ambulatory Visit: Payer: Self-pay | Admitting: Internal Medicine

## 2020-09-15 DIAGNOSIS — E1142 Type 2 diabetes mellitus with diabetic polyneuropathy: Secondary | ICD-10-CM

## 2020-09-15 NOTE — Telephone Encounter (Signed)
Requested medication (s) are due for refill today - unsure  Requested medication (s) are on the active medication list -no  Future visit scheduled -yes  Last refill: 11/28/19  Notes to clinic: Rx refill request- medication discontinued 08/02/20- sent for review   Requested Prescriptions  Pending Prescriptions Disp Refills   gabapentin (NEURONTIN) 100 MG capsule [Pharmacy Med Name: Gabapentin 100 MG Oral Capsule] 60 capsule 0    Sig: TAKE 2 CAPSULES BY MOUTH ONCE DAILY AT BEDTIME     Neurology: Anticonvulsants - gabapentin Passed - 09/15/2020  1:09 PM      Passed - Valid encounter within last 12 months    Recent Outpatient Visits           2 months ago Controlled type 2 diabetes mellitus with diabetic polyneuropathy, without long-term current use of insulin (HCC)   [facility] [place], [place], MD   10 months ago Need for influenza vaccination   [facility] [place], Jeannett Senior L, RPH-CPP   12 months ago Controlled type 2 diabetes mellitus with diabetic polyneuropathy, without long-term current use of insulin (HCC)   [facility] [place], Gavin Pound B, MD   1 year ago Controlled type 2 diabetes mellitus with diabetic polyneuropathy, without long-term current use of insulin (HCC)   [facility] Jonah Blue B, MD   1 year ago Controlled type 2 diabetes mellitus without complication, without long-term current use of insulin (HCC)   [facility] Marcine Matar, MD       Future Appointments             In 4 weeks Laural Benes Binnie Rail, MD [facility]               Requested Prescriptions  Pending Prescriptions Disp Refills   gabapentin (NEURONTIN) 100 MG capsule [Pharmacy Med Name: Gabapentin 100 MG Oral Capsule] 60 capsule 0    Sig: TAKE 2 CAPSULES BY MOUTH ONCE DAILY AT BEDTIME     Neurology:  Anticonvulsants - gabapentin Passed - 09/15/2020  1:09 PM      Passed - Valid encounter within last 12 months    Recent Outpatient Visits           2 months ago Controlled type 2 diabetes mellitus with diabetic polyneuropathy, without long-term current use of insulin (HCC)   [facility] [place], [place], MD   10 months ago Need for influenza vaccination   [facility] [place], Jeannett Senior L, RPH-CPP   12 months ago Controlled type 2 diabetes mellitus with diabetic polyneuropathy, without long-term current use of insulin (HCC)   [place] [place], Gavin Pound B, MD   1 year ago Controlled type 2 diabetes mellitus with diabetic polyneuropathy, without long-term current use of insulin (HCC)   [place] [facility] Jonah Blue B, MD   1 year ago Controlled type 2 diabetes mellitus without complication, without long-term current use of insulin [facility])   [facility] Marcine Matar, MD       Future Appointments             In 4 weeks Marcine Matar, MD [facility]

## 2020-09-21 ENCOUNTER — Other Ambulatory Visit: Payer: Self-pay | Admitting: Internal Medicine

## 2020-09-21 ENCOUNTER — Other Ambulatory Visit: Payer: Self-pay

## 2020-09-21 DIAGNOSIS — I10 Essential (primary) hypertension: Secondary | ICD-10-CM

## 2020-09-21 DIAGNOSIS — J849 Interstitial pulmonary disease, unspecified: Secondary | ICD-10-CM

## 2020-09-21 MED ORDER — BUDESONIDE-FORMOTEROL FUMARATE 80-4.5 MCG/ACT IN AERO
2.0000 | INHALATION_SPRAY | Freq: Two times a day (BID) | RESPIRATORY_TRACT | 0 refills | Status: DC
  Filled 2020-09-21: qty 10.2, 30d supply, fill #0

## 2020-09-21 MED ORDER — CARVEDILOL 12.5 MG PO TABS
ORAL_TABLET | Freq: Two times a day (BID) | ORAL | 0 refills | Status: DC
  Filled 2020-09-21: qty 60, 30d supply, fill #0

## 2020-09-21 MED FILL — Albuterol Sulfate Inhal Aero 108 MCG/ACT (90MCG Base Equiv): RESPIRATORY_TRACT | 16 days supply | Qty: 18 | Fill #2 | Status: AC

## 2020-10-13 ENCOUNTER — Other Ambulatory Visit: Payer: Self-pay

## 2020-10-13 ENCOUNTER — Encounter: Payer: Self-pay | Admitting: Internal Medicine

## 2020-10-13 ENCOUNTER — Ambulatory Visit: Payer: Self-pay | Attending: Internal Medicine | Admitting: Internal Medicine

## 2020-10-13 VITALS — BP 135/80 | HR 68 | Resp 16 | Wt 129.2 lb

## 2020-10-13 DIAGNOSIS — E1142 Type 2 diabetes mellitus with diabetic polyneuropathy: Secondary | ICD-10-CM

## 2020-10-13 DIAGNOSIS — M5430 Sciatica, unspecified side: Secondary | ICD-10-CM

## 2020-10-13 DIAGNOSIS — Z23 Encounter for immunization: Secondary | ICD-10-CM

## 2020-10-13 DIAGNOSIS — E1169 Type 2 diabetes mellitus with other specified complication: Secondary | ICD-10-CM

## 2020-10-13 DIAGNOSIS — I10 Essential (primary) hypertension: Secondary | ICD-10-CM

## 2020-10-13 DIAGNOSIS — E785 Hyperlipidemia, unspecified: Secondary | ICD-10-CM

## 2020-10-13 DIAGNOSIS — Z1231 Encounter for screening mammogram for malignant neoplasm of breast: Secondary | ICD-10-CM

## 2020-10-13 DIAGNOSIS — Z1211 Encounter for screening for malignant neoplasm of colon: Secondary | ICD-10-CM

## 2020-10-13 LAB — POCT GLYCOSYLATED HEMOGLOBIN (HGB A1C): HbA1c, POC (controlled diabetic range): 6.5 % (ref 0.0–7.0)

## 2020-10-13 LAB — GLUCOSE, POCT (MANUAL RESULT ENTRY): POC Glucose: 87 mg/dl (ref 70–99)

## 2020-10-13 MED ORDER — GABAPENTIN 100 MG PO CAPS
ORAL_CAPSULE | ORAL | 1 refills | Status: DC
  Filled 2020-10-13: qty 120, 32d supply, fill #0
  Filled 2020-12-30 – 2021-01-19 (×2): qty 120, 32d supply, fill #1

## 2020-10-13 MED ORDER — CARVEDILOL 12.5 MG PO TABS
ORAL_TABLET | Freq: Two times a day (BID) | ORAL | 0 refills | Status: DC
  Filled 2020-10-13: qty 60, 30d supply, fill #0

## 2020-10-13 NOTE — Progress Notes (Signed)
Patient ID: Tammy Osborne, female    DOB: 17-Jan-1956  MRN: [id number]  CC: Diabetes and Hypertension   Subjective: Tammy Osborne is a 65 y.o. female who presents for chronic ds management Her concerns today include:  Patient with history of HTN,  DM 2, probable ILD, HL, anemia, GERD, aortic atherosclerosis  ILD:  last saw Dr. Celine Mans 07/2020 and plan is to f/u 10/2020.  On Prednisone and Bactrim 3x/wk.  Doing a lot better on meds.  Compliant with Symbicort  HTN:  compliant with Norvasc, Coreg and Cozaar.  Took already today. Limiting salt in foods No CP or LE edema  Requesting med for pain in RT leg that has returned x 1 mth.  Endorses numbness and tingling in the leg.  Endorse pain from below buttock all the way down leg to toes.  No incontinence of bowel or bladder. No falls. No initiating factors.  Placed on Gabapentin in past for same with good results.    DIABETES TYPE 2 Last A1C:   Results for orders placed or performed in visit on 10/13/20  POCT glucose (manual entry)  Result Value Ref Range   POC Glucose 87 70 - 99 mg/dl  POCT glycosylated hemoglobin (Hb A1C)  Result Value Ref Range   Hemoglobin A1C     HbA1c POC (<> result, manual entry)     HbA1c, POC (prediabetic range)     HbA1c, POC (controlled diabetic range) 6.5 0.0 - 7.0 %    Med Adherence:  [x]  Yes - on Metformin    []  No Medication side effects:  []  Yes    [x]  No Home Monitoring?  []  Yes    [x]  No Home glucose results range: Diet Adherence: []  Yes    []  No Exercise: []  Yes    [x]  No Hypoglycemic episodes?: []  Yes    []  No Numbness of the feet? []  Yes    [x]  No Retinopathy hx? []  Yes    []  No Last eye exam: over due, no insurance Comments:    Patient Active Problem List   Diagnosis Date Noted   Aortic atherosclerosis (HCC) 09/21/2019   Hyperlipidemia associated with type 2 diabetes mellitus (HCC) 09/21/2019   Gastroesophageal reflux disease without esophagitis 04/04/2018   Chest pain  04/04/2018   Controlled type 2 diabetes mellitus with diabetic polyneuropathy, without long-term current use of insulin (HCC) 10/19/2016   Essential hypertension 09/06/2016   Interstitial pulmonary disease (HCC) 09/06/2016   Cough in adult 09/06/2016   Anemia 09/06/2016   Hyperlipidemia 09/06/2016   Numbness of lower limb 09/06/2016     Current Outpatient Medications on File Prior to Visit  Medication Sig Dispense Refill   budesonide-formoterol (SYMBICORT) 80-4.5 MCG/ACT inhaler Inhale 2 puffs into the lungs 2 (two) times daily. 10.2 g 0   albuterol (VENTOLIN HFA) 108 (90 Base) MCG/ACT inhaler INHALE 2 PUFFS INTO THE LUNGS EVERY 4 (FOUR) HOURS AS NEEDED. 18 g 11   amLODipine (NORVASC) 10 MG tablet TAKE 1 TABLET (10 MG TOTAL) BY MOUTH DAILY. 30 tablet 6   aspirin EC 81 MG tablet Take 1 tablet (81 mg total) by mouth daily. 100 tablet 1   losartan (COZAAR) 25 MG tablet TAKE 1 TABLET (25 MG TOTAL) BY MOUTH DAILY. 30 tablet 6   metFORMIN (GLUCOPHAGE) 500 MG tablet TAKE 1 TABLET (500 MG TOTAL) BY MOUTH DAILY WITH BREAKFAST. (Patient not taking: Reported on 08/02/2020) 90 tablet 3   omeprazole (PRILOSEC) 20 MG capsule Take  1 capsule (20 mg total) by mouth daily. 30 capsule 6   predniSONE (DELTASONE) 20 MG tablet Take 1 tablet (20 mg total) by mouth daily with breakfast. 30 tablet 3   rosuvastatin (CRESTOR) 20 MG tablet Take 1 tablet (20 mg total) by mouth daily. 90 tablet 1   sulfamethoxazole-trimethoprim (BACTRIM DS) 800-160 MG tablet Take 1 tablet by mouth every Monday, Wednesday, and Friday. 12 tablet 3   No current facility-administered medications on file prior to visit.    No Known Allergies  Social History   Socioeconomic History   Marital status: Single    Spouse name: Not on file   Number of children: 2   Years of education: Not on file   Highest education level: Not on file  Occupational History   Not on file  Tobacco Use   Smoking status: Never    Passive exposure: Yes    Smokeless tobacco: Never   Tobacco comments:    spouse smoked in home X6-7 years.   Vaping Use   Vaping Use: Never used  Substance and Sexual Activity   Alcohol use: No   Drug use: Not on file   Sexual activity: Not on file  Other Topics Concern   Not on file  Social History Narrative   Not on file   Social Determinants of Health   Financial Resource Strain: Not on file  Food Insecurity: Not on file  Transportation Needs: Not on file  Physical Activity: Not on file  Stress: Not on file  Social Connections: Not on file  Intimate Partner Violence: Not on file    Family History  Problem Relation Age of Onset   Hypertension Mother     Past Surgical History:  Procedure Laterality Date   ABDOMINAL HYSTERECTOMY      ROS: Review of Systems Negative except as stated above  PHYSICAL EXAM: BP 135/80   Pulse 68   Resp 16   Wt 129 lb 3.2 oz (58.6 kg)   LMP  (LMP Unknown)   SpO2 95%   BMI 27.00 kg/m   Physical Exam  General appearance - alert, well appearing, and in no distress Mental status - normal mood, behavior, speech, dress, motor activity, and thought processes Neck - supple, no significant adenopathy Chest -patient with Velcro crackles heard at the bases of both lungs.  Of the lung fields are clear. Heart - normal rate, regular rhythm, normal S1, S2, no murmurs, rubs, clicks or gallops Neurological -power in both lower extremities 5/5 bilaterally.  Mild decrease sensation in the right lower leg compared to the left. Extremities -no lower extremity edema   CMP Latest Ref Rng & Units 07/21/2020 02/03/2019 08/15/2017  Glucose 65 - 99 mg/dL 83 96 638(V)  BUN 8 - 27 mg/dL 15 10 20   Creatinine 0.57 - 1.00 mg/dL 5.64 3.32  Sodium 134 - 144 mmol/L 141 143 142  Potassium 3.5 - 5.2 mmol/L 4.2 4.4 3.5  Chloride 96 - 106 mmol/L 99 104 107  CO2 20 - 29 mmol/L 26 24 21(L)  Calcium 8.7 - 10.3 mg/dL 9.9 9.51 9.4  Total Protein 6.0 - 8.5 g/dL 7.1 7.2 8.1  Total  Bilirubin 0.0 - 1.2 mg/dL 0.3 0.3 88.4)  Alkaline Phos 44 - 121 IU/L 125(H) 184(H) 126  AST 0 - 40 IU/L 19 18 40  ALT 0 - 32 IU/L 32 17 44   Lipid Panel     Component Value Date/Time   CHOL 263 (H) 07/21/2020 07/23/2020  TRIG 234 (H) 07/21/2020 0838   HDL 72 07/21/2020 0838   CHOLHDL 3.7 07/21/2020 0838   LDLCALC 149 (H) 07/21/2020 0838    CBC    Component Value Date/Time   WBC 13.7 (H) 09/21/2019 1210   WBC 29.8 (H) 08/15/2017 0611   RBC 5.79 (H) 09/21/2019 1210   RBC 5.40 (H) 08/15/2017 0611   HGB 15.5 09/21/2019 1210   HCT 46.5 09/21/2019 1210   PLT 423 02/03/2019 0924   MCV 80 09/21/2019 1210   MCH 26.8 09/21/2019 1210   MCH 27.0 08/15/2017 0611   MCHC 33.3 09/21/2019 1210   MCHC 32.8 08/15/2017 0611   RDW 17.2 (H) 09/21/2019 1210   LYMPHSABS 5.0 (H) 09/21/2019 1210   MONOABS 0.9 10/15/2016 1553   EOSABS 0.0 09/21/2019 1210   BASOSABS 0.1 09/21/2019 1210    ASSESSMENT AND PLAN: 1. Controlled type 2 diabetes mellitus with diabetic polyneuropathy, without long-term current use of insulin (HCC) Continue metformin, healthy eating habits and regular exercise. Encouraged her to get eye exam done when she is able to afford. - POCT glucose (manual entry) - POCT glycosylated hemoglobin (Hb A1C) - Microalbumin / creatinine urine ratio  2. Essential hypertension Close to goal.  Continue carvedilol, amlodipine and Cozaar - carvedilol (COREG) 12.5 MG tablet; TAKE 1 TABLET (12.5 MG TOTAL) BY MOUTH 2 (TWO) TIMES DAILY WITH A MEAL.  Dispense: 60 tablet; Refill: 0  3. Hyperlipidemia associated with type 2 diabetes mellitus (HCC) Continue Crestor.  4. Sciatic leg pain We will try her on gabapentin.  If symptoms do not improve or get worse she will follow-up with me. - gabapentin (NEURONTIN) 100 MG capsule; Take 2 capsules by mouth at bedtime for 5 days then 2 tabs twice daily  Dispense: 120 capsule; Refill: 1  5. Screening for colon cancer - Fecal occult blood,  imunochemical([facility])  6. Encounter for screening mammogram for malignant neoplasm of breast - MM Digital Screening; Future  7. Need for immunization against influenza - Flu Vaccine QUAD 68mo+IM (Fluarix, Fluzone & Alfiuria Quad PF)     Patient was given the opportunity to ask questions.  Patient verbalized understanding of the plan and was able to repeat key elements of the plan.  [employer] Language interpreter used during this encounter. #[id number]. Jesus  Orders Placed This Encounter  Procedures   Fecal occult blood, imunochemical([facility]/Sun[facility])   MM Digital Screening   Flu Vaccine QUAD 40mo+IM (Fluarix, Fluzone & Alfiuria Quad PF)   Microalbumin / creatinine urine ratio   POCT glucose (manual entry)   POCT glycosylated hemoglobin (Hb A1C)     Requested Prescriptions   Signed Prescriptions Disp Refills   carvedilol (COREG) 12.5 MG tablet 60 tablet 0    Sig: TAKE 1 TABLET (12.5 MG TOTAL) BY MOUTH 2 (TWO) TIMES DAILY WITH A MEAL.   gabapentin (NEURONTIN) 100 MG capsule 120 capsule 1    Sig: Take 2 capsules by mouth at bedtime for 5 days then 2 tabs twice daily    Return in about 4 months (around 02/12/2021).  Jonah Blue, MD, FACP

## 2020-10-14 LAB — MICROALBUMIN / CREATININE URINE RATIO
Creatinine, Urine: 205.4 mg/dL
Microalb/Creat Ratio: 12 mg/g creat (ref 0–29)
Microalbumin, Urine: 24.6 ug/mL

## 2020-10-26 ENCOUNTER — Other Ambulatory Visit: Payer: Self-pay

## 2020-10-28 LAB — FECAL OCCULT BLOOD, IMMUNOCHEMICAL: Fecal Occult Bld: NEGATIVE

## 2020-11-11 ENCOUNTER — Encounter: Payer: Self-pay | Admitting: Internal Medicine

## 2020-11-11 ENCOUNTER — Ambulatory Visit (INDEPENDENT_AMBULATORY_CARE_PROVIDER_SITE_OTHER): Payer: No Typology Code available for payment source | Admitting: Internal Medicine

## 2020-11-11 ENCOUNTER — Other Ambulatory Visit: Payer: Self-pay

## 2020-11-11 VITALS — BP 122/60 | HR 78 | Temp 98.3°F | Ht 58.5 in | Wt 130.0 lb

## 2020-11-11 DIAGNOSIS — J679 Hypersensitivity pneumonitis due to unspecified organic dust: Secondary | ICD-10-CM

## 2020-11-11 DIAGNOSIS — Z5181 Encounter for therapeutic drug level monitoring: Secondary | ICD-10-CM

## 2020-11-11 LAB — COMPREHENSIVE METABOLIC PANEL
ALT: 18 U/L (ref 0–35)
AST: 20 U/L (ref 0–37)
Albumin: 4.4 g/dL (ref 3.5–5.2)
Alkaline Phosphatase: 125 U/L — ABNORMAL HIGH (ref 39–117)
BUN: 18 mg/dL (ref 6–23)
CO2: 27 mEq/L (ref 19–32)
Calcium: 9.6 mg/dL (ref 8.4–10.5)
Chloride: 104 mEq/L (ref 96–112)
Creatinine, Ser: 0.76 mg/dL (ref 0.40–1.20)
GFR: 82.85 mL/min (ref >60.00)
Glucose, Bld: 91 mg/dL (ref 70–99)
Potassium: 3.4 mEq/L — ABNORMAL LOW (ref 3.5–5.1)
Sodium: 140 mEq/L (ref 135–145)
Total Bilirubin: 0.4 mg/dL (ref 0.2–1.2)
Total Protein: 7.4 g/dL (ref 6.0–8.3)

## 2020-11-11 LAB — CBC WITH DIFFERENTIAL/PLATELET
Basophils Absolute: 0 10*3/uL (ref 0.0–0.1)
Basophils Relative: 0.5 % (ref 0.0–3.0)
Eosinophils Absolute: 0 10*3/uL (ref 0.0–0.7)
Eosinophils Relative: 0 % (ref 0.0–5.0)
HCT: 42.6 % (ref 36.0–46.0)
Hemoglobin: 14.1 g/dL (ref 12.0–15.0)
Lymphocytes Relative: 37.3 % (ref 12.0–46.0)
Lymphs Abs: 3.9 10*3/uL (ref 0.7–4.0)
MCHC: 33 g/dL (ref 30.0–36.0)
MCV: 80.4 fl (ref 78.0–100.0)
Monocytes Absolute: 1 10*3/uL (ref 0.1–1.0)
Monocytes Relative: 9.8 % (ref 3.0–12.0)
Neutro Abs: 5.5 10*3/uL (ref 1.4–7.7)
Neutrophils Relative %: 52.4 % (ref 43.0–77.0)
Platelets: 369 10*3/uL (ref 150.0–400.0)
RBC: 5.3 Mil/uL — ABNORMAL HIGH (ref 3.87–5.11)
RDW: 15.1 % (ref 11.5–15.5)
WBC: 10.5 10*3/uL (ref 4.0–10.5)

## 2020-11-11 MED ORDER — AZATHIOPRINE 50 MG PO TABS
150.0000 mg | ORAL_TABLET | Freq: Every day | ORAL | 5 refills | Status: DC
  Filled 2020-11-11: qty 90, 30d supply, fill #0
  Filled 2020-12-28: qty 90, 30d supply, fill #1

## 2020-11-11 MED ORDER — SULFAMETHOXAZOLE-TRIMETHOPRIM 800-160 MG PO TABS
1.0000 | ORAL_TABLET | ORAL | 3 refills | Status: DC
  Filled 2020-11-11: qty 12, 28d supply, fill #0
  Filled 2020-12-28: qty 12, 28d supply, fill #1
  Filled 2021-02-15: qty 12, 28d supply, fill #0
  Filled 2021-03-17: qty 12, 28d supply, fill #1

## 2020-11-11 NOTE — Progress Notes (Signed)
Elyse Prevo    [id number]    Mar 23, 1955  Primary Care Physician:Johnson, Binnie Rail, MD Date of Appointment: 11/11/2020 Established Patient Visit  Chief complaint:   Chief Complaint  Patient presents with   Follow-up    ILD      HPI: Tammy Osborne is a 65 y.o. woman spanish speaking who presents for hypersensitivity pneumonitis.  Interpreter present.   Interval Updates: Here for follow up. Still feeling good on prednisone 20 mg daily. Breathing completely improved.  Still taking symbicort inhaler which helps has had weight gain from prednisone. Stopped her bactrim two weeks ago.     Social History: [place] moved here around [place]. Never smoker, but did have indoor wood burning stove in childhood.  Has lived in the same house for 5 years which is when  her symptoms started. With her brother sister and a nephew. No one else has breathing problems at home. She has worked in the same job for the last 5 years. No pets at home. No chickens or birds. Works at [employer] in [place]. 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 No musical instruments. No visible mold. Does not use down feather pillows. Has mice in her home, but no bats.   I have reviewed the patient's family social and past medical history and updated as appropriate.   Past Medical History:  Diagnosis Date   Diabetes mellitus without complication (HCC)    Hypertension     Past Surgical History:  Procedure Laterality Date   ABDOMINAL HYSTERECTOMY      Family History  Problem Relation Age of Onset   Hypertension Mother     Social History   Occupational History   Not on file  Tobacco Use   Smoking status: Never    Passive exposure: Yes   Smokeless tobacco: Never   Tobacco comments:    spouse smoked in home X6-7 years.   Vaping Use   Vaping Use: Never used  Substance and Sexual Activity   Alcohol use: No   Drug use: Not on  file   Sexual activity: Not on file     Physical Exam: Blood pressure 122/60, pulse 78, temperature 98.3 F (36.8 C), temperature source Oral, height 4' 10.5" (1.486 m), weight 130 lb (59 kg), SpO2 98 %.  Gen:      No acute distress Lungs:   Inspiratory sqwak bilaterally.  CV:         Regular rate and rhythm; no murmurs, rubs, or gallops.  No pedal edema   Data Reviewed: Imaging: I have personally reviewed the CT scan from Sept 2020 which shows mosaic attenuation. The ct chest in 2018 has diffuse GGOs which have improved in 2020.   PFTs:   PFT Results Latest Ref Rng & Units 03/23/2020 06/18/2017 11/22/2016  FVC-Pre L 1.20 1.57 1.73  FVC-Predicted Pre % 47 58 63  FVC-Post L 1.23 1.58 1.76  FVC-Predicted Post % 48 58 64  Pre FEV1/FVC % % 99 91 92  Post FEV1/FCV % % 97 92 91  FEV1-Pre L 1.18 1.42 1.59  FEV1-Predicted Pre % 61 68 76  FEV1-Post L 1.19 1.46 1.60  DLCO uncorrected ml/min/mmHg - 11.45 11.90  DLCO UNC% % - 65 67  DLCO corrected ml/min/mmHg - - 11.75  DLCO COR %Predicted % - - 67  DLVA Predicted % - 104 98  TLC L 2.51 2.99 2.81  TLC % Predicted % 60 69 65  RV % Predicted % 45 42 55   I have personally reviewed the patient's PFTs and they show moderate restriction to ventilation. This is worse when compared to PFTs in 2018-2019.   Labs: Labs reviewed - had autoimmune serologies negative in 2018. HP panel negative.   Immunization status: Immunization History  Administered Date(s) Administered   Influenza,inj,Quad PF,6+ Mos 10/19/2016, 09/19/2017, 11/05/2018, 11/17/2019, 10/13/2020   Moderna Sars-Covid-2 Vaccination 06/27/2019, 07/25/2019   PNEUMOCOCCAL CONJUGATE-20 07/06/2020   Pneumococcal Polysaccharide-23 10/19/2016   Tdap 12/24/2016    Assessment:  Subacute Hypersensitivity Pneumonitis, unknown etiology Encounter for drug toxicity monitoring GERD, stable  Plan/Recommendations:  Continue prednisone 20 mg daily with bactrim MWF. Will start  azathioprine 150 mg daily. Will taper prednisone at next visit. PFTs at next visit. CBC weekly the first month, then once a month. CMP today and monthly.  Continue symbicort and albuterol Continue ppi for reflux.   Continue bactrim!   Return to Care: Return in about 3 months (around 02/11/2021).   Durel Salts, MD Pulmonary and Critical Care Medicine [facility] Main Office:[phone]

## 2020-11-11 NOTE — Patient Instructions (Addendum)
Please schedule follow up scheduled with myself in 3 months.  If my schedule is not open yet, we will contact you with a reminder closer to that time.  Before your next visit I would like you to have: Full set of PFTs (same day)  Start taking the new medication azathioprine 50 mg once a day for the first week. The second week take 2 tablets (100 mg) once a day. The third week take 3 tablets (150 mg) once a day. Then continue 150 mg daily.    Before your next visit I would like you to have: Blood work  - done today  Get your labs done once a week at [facility]. After the next three times, you can start going once a month.   Then once a week (3 times) Then once a month until you come see me again

## 2020-11-16 ENCOUNTER — Telehealth: Payer: Self-pay | Admitting: Internal Medicine

## 2020-11-16 DIAGNOSIS — Z5181 Encounter for therapeutic drug level monitoring: Secondary | ICD-10-CM

## 2020-11-16 NOTE — Telephone Encounter (Signed)
Plan/Recommendations:   Continue prednisone 20 mg daily with bactrim MWF. Will start azathioprine 150 mg daily. Will taper prednisone at next visit. PFTs at next visit. CBC weekly the first month, then once a month. CMP today and monthly.  Continue symbicort and albuterol Continue ppi for reflux.    Continue bactrim!    Return to Care: Return in about 3 months (around 02/11/2021).    I have called [facility] and the pt and they are aware of the fridays that she will need to come back for the lab recheck.  Order has been placed.  Nothing further is needed.

## 2020-11-18 ENCOUNTER — Encounter: Payer: Self-pay | Admitting: *Deleted

## 2020-11-18 ENCOUNTER — Other Ambulatory Visit (INDEPENDENT_AMBULATORY_CARE_PROVIDER_SITE_OTHER): Payer: No Typology Code available for payment source

## 2020-11-18 DIAGNOSIS — Z5181 Encounter for therapeutic drug level monitoring: Secondary | ICD-10-CM

## 2020-11-18 LAB — CBC WITH DIFFERENTIAL/PLATELET
Basophils Absolute: 0 10*3/uL (ref 0.0–0.1)
Basophils Relative: 0.4 % (ref 0.0–3.0)
Eosinophils Absolute: 0 10*3/uL (ref 0.0–0.7)
Eosinophils Relative: 0 % (ref 0.0–5.0)
HCT: 42.5 % (ref 36.0–46.0)
Hemoglobin: 13.6 g/dL (ref 12.0–15.0)
Lymphocytes Relative: 40.1 % (ref 12.0–46.0)
Lymphs Abs: 3.8 10*3/uL (ref 0.7–4.0)
MCHC: 32 g/dL (ref 30.0–36.0)
MCV: 81.3 fl (ref 78.0–100.0)
Monocytes Absolute: 0.8 10*3/uL (ref 0.1–1.0)
Monocytes Relative: 8.6 % (ref 3.0–12.0)
Neutro Abs: 4.9 10*3/uL (ref 1.4–7.7)
Neutrophils Relative %: 50.9 % (ref 43.0–77.0)
Platelets: 328 10*3/uL (ref 150.0–400.0)
RBC: 5.22 Mil/uL — ABNORMAL HIGH (ref 3.87–5.11)
RDW: 15.5 % (ref 11.5–15.5)
WBC: 9.6 10*3/uL (ref 4.0–10.5)

## 2020-11-25 ENCOUNTER — Other Ambulatory Visit: Payer: No Typology Code available for payment source

## 2020-11-25 DIAGNOSIS — Z5181 Encounter for therapeutic drug level monitoring: Secondary | ICD-10-CM

## 2020-11-25 DIAGNOSIS — J679 Hypersensitivity pneumonitis due to unspecified organic dust: Secondary | ICD-10-CM

## 2020-11-25 LAB — COMPREHENSIVE METABOLIC PANEL
ALT: 16 U/L (ref 0–35)
AST: 16 U/L (ref 0–37)
Albumin: 4.2 g/dL (ref 3.5–5.2)
Alkaline Phosphatase: 114 U/L (ref 39–117)
BUN: 17 mg/dL (ref 6–23)
CO2: 27 mEq/L (ref 19–32)
Calcium: 9.5 mg/dL (ref 8.4–10.5)
Chloride: 102 mEq/L (ref 96–112)
Creatinine, Ser: 0.76 mg/dL (ref 0.40–1.20)
GFR: 82.83 mL/min (ref >60.00)
Glucose, Bld: 139 mg/dL — ABNORMAL HIGH (ref 70–99)
Potassium: 3.4 mEq/L — ABNORMAL LOW (ref 3.5–5.1)
Sodium: 139 mEq/L (ref 135–145)
Total Bilirubin: 0.2 mg/dL (ref 0.2–1.2)
Total Protein: 6.9 g/dL (ref 6.0–8.3)

## 2020-11-25 LAB — CBC WITH DIFFERENTIAL/PLATELET
Basophils Absolute: 0.1 10*3/uL (ref 0.0–0.1)
Basophils Relative: 0.4 % (ref 0.0–3.0)
Eosinophils Absolute: 0 10*3/uL (ref 0.0–0.7)
Eosinophils Relative: 0 % (ref 0.0–5.0)
HCT: 41.1 % (ref 36.0–46.0)
Hemoglobin: 13.3 g/dL (ref 12.0–15.0)
Lymphocytes Relative: 31 % (ref 12.0–46.0)
Lymphs Abs: 4.4 10*3/uL — ABNORMAL HIGH (ref 0.7–4.0)
MCHC: 32.3 g/dL (ref 30.0–36.0)
MCV: 82.4 fl (ref 78.0–100.0)
Monocytes Absolute: 1 10*3/uL (ref 0.1–1.0)
Monocytes Relative: 7.1 % (ref 3.0–12.0)
Neutro Abs: 8.7 10*3/uL — ABNORMAL HIGH (ref 1.4–7.7)
Neutrophils Relative %: 61.5 % (ref 43.0–77.0)
Platelets: 396 10*3/uL (ref 150.0–400.0)
RBC: 4.98 Mil/uL (ref 3.87–5.11)
RDW: 15 % (ref 11.5–15.5)
WBC: 14.1 10*3/uL — ABNORMAL HIGH (ref 4.0–10.5)

## 2020-12-05 ENCOUNTER — Other Ambulatory Visit: Payer: Self-pay | Admitting: Internal Medicine

## 2020-12-05 ENCOUNTER — Other Ambulatory Visit: Payer: Self-pay

## 2020-12-05 DIAGNOSIS — I10 Essential (primary) hypertension: Secondary | ICD-10-CM

## 2020-12-05 MED ORDER — CARVEDILOL 12.5 MG PO TABS
ORAL_TABLET | Freq: Two times a day (BID) | ORAL | 0 refills | Status: DC
Start: 2020-12-05 — End: 2021-02-14
  Filled 2020-12-05: qty 60, 30d supply, fill #0

## 2020-12-05 NOTE — Telephone Encounter (Signed)
Requested Prescriptions  Pending Prescriptions Disp Refills  . carvedilol (COREG) 12.5 MG tablet 60 tablet 0    Sig: TAKE 1 TABLET (12.5 MG TOTAL) BY MOUTH 2 (TWO) TIMES DAILY WITH A MEAL.     Cardiovascular:  Beta Blockers Passed - 12/05/2020  4:42 PM      Passed - Last BP in normal range    BP Readings from Last 1 Encounters:  11/11/20 122/60         Passed - Last Heart Rate in normal range    Pulse Readings from Last 1 Encounters:  11/11/20 78         Passed - Valid encounter within last 6 months    Recent Outpatient Visits          1 month ago Controlled type 2 diabetes mellitus with diabetic polyneuropathy, without long-term current use of insulin (HCC)   [place] [facility] [place], Gavin Pound B, MD   5 months ago Controlled type 2 diabetes mellitus with diabetic polyneuropathy, without long-term current use of insulin (HCC)   [place] [facility] Hoy Register, MD   1 year ago Need for influenza vaccination   [facility] [place], Cornelius Moras, RPH-CPP   1 year ago Controlled type 2 diabetes mellitus with diabetic polyneuropathy, without long-term current use of insulin [facility])   [place] [facility] Jonah Blue B, MD   1 year ago Controlled type 2 diabetes mellitus with diabetic polyneuropathy, without long-term current use of insulin [facility])   [place] [facility] Marcine Matar, MD      Future Appointments            In 2 months La[place], Binnie Rail, MD [facility]

## 2020-12-06 ENCOUNTER — Other Ambulatory Visit: Payer: No Typology Code available for payment source

## 2020-12-06 ENCOUNTER — Other Ambulatory Visit: Payer: Self-pay

## 2020-12-06 DIAGNOSIS — Z5181 Encounter for therapeutic drug level monitoring: Secondary | ICD-10-CM

## 2020-12-06 DIAGNOSIS — J679 Hypersensitivity pneumonitis due to unspecified organic dust: Secondary | ICD-10-CM

## 2020-12-06 LAB — CBC WITH DIFFERENTIAL/PLATELET
Basophils Absolute: 0 10*3/uL (ref 0.0–0.1)
Basophils Relative: 0.4 % (ref 0.0–3.0)
Eosinophils Absolute: 0 10*3/uL (ref 0.0–0.7)
Eosinophils Relative: 0 % (ref 0.0–5.0)
HCT: 42.1 % (ref 36.0–46.0)
Hemoglobin: 13.5 g/dL (ref 12.0–15.0)
Lymphocytes Relative: 33.1 % (ref 12.0–46.0)
Lymphs Abs: 2.9 10*3/uL (ref 0.7–4.0)
MCHC: 32.1 g/dL (ref 30.0–36.0)
MCV: 82.7 fl (ref 78.0–100.0)
Monocytes Absolute: 0.7 10*3/uL (ref 0.1–1.0)
Monocytes Relative: 7.7 % (ref 3.0–12.0)
Neutro Abs: 5.1 10*3/uL (ref 1.4–7.7)
Neutrophils Relative %: 58.8 % (ref 43.0–77.0)
Platelets: 392 10*3/uL (ref 150.0–400.0)
RBC: 5.09 Mil/uL (ref 3.87–5.11)
RDW: 14.7 % (ref 11.5–15.5)
WBC: 8.6 10*3/uL (ref 4.0–10.5)

## 2020-12-06 LAB — COMPREHENSIVE METABOLIC PANEL
ALT: 23 U/L (ref 0–35)
AST: 25 U/L (ref 0–37)
Albumin: 4.3 g/dL (ref 3.5–5.2)
Alkaline Phosphatase: 114 U/L (ref 39–117)
BUN: 14 mg/dL (ref 6–23)
CO2: 25 mEq/L (ref 19–32)
Calcium: 9.4 mg/dL (ref 8.4–10.5)
Chloride: 103 mEq/L (ref 96–112)
Creatinine, Ser: 0.72 mg/dL (ref 0.40–1.20)
GFR: 88.36 mL/min (ref >60.00)
Glucose, Bld: 80 mg/dL (ref 70–99)
Potassium: 3.6 mEq/L (ref 3.5–5.1)
Sodium: 137 mEq/L (ref 135–145)
Total Bilirubin: 0.3 mg/dL (ref 0.2–1.2)
Total Protein: 7.1 g/dL (ref 6.0–8.3)

## 2020-12-13 ENCOUNTER — Other Ambulatory Visit: Payer: No Typology Code available for payment source

## 2020-12-13 ENCOUNTER — Other Ambulatory Visit: Payer: Self-pay

## 2020-12-13 DIAGNOSIS — J679 Hypersensitivity pneumonitis due to unspecified organic dust: Secondary | ICD-10-CM

## 2020-12-13 DIAGNOSIS — Z5181 Encounter for therapeutic drug level monitoring: Secondary | ICD-10-CM

## 2020-12-13 LAB — COMPREHENSIVE METABOLIC PANEL
ALT: 66 U/L — ABNORMAL HIGH (ref 0–35)
AST: 46 U/L — ABNORMAL HIGH (ref 0–37)
Albumin: 4.5 g/dL (ref 3.5–5.2)
Alkaline Phosphatase: 119 U/L — ABNORMAL HIGH (ref 39–117)
BUN: 16 mg/dL (ref 6–23)
CO2: 26 mEq/L (ref 19–32)
Calcium: 9.4 mg/dL (ref 8.4–10.5)
Chloride: 100 mEq/L (ref 96–112)
Creatinine, Ser: 0.69 mg/dL (ref 0.40–1.20)
GFR: 91.71 mL/min (ref >60.00)
Glucose, Bld: 150 mg/dL — ABNORMAL HIGH (ref 70–99)
Potassium: 3.1 mEq/L — ABNORMAL LOW (ref 3.5–5.1)
Sodium: 137 mEq/L (ref 135–145)
Total Bilirubin: 0.4 mg/dL (ref 0.2–1.2)
Total Protein: 7.4 g/dL (ref 6.0–8.3)

## 2020-12-13 LAB — CBC WITH DIFFERENTIAL/PLATELET
Basophils Absolute: 0 10*3/uL (ref 0.0–0.1)
Basophils Relative: 0.2 % (ref 0.0–3.0)
Eosinophils Absolute: 0 10*3/uL (ref 0.0–0.7)
Eosinophils Relative: 0 % (ref 0.0–5.0)
HCT: 43.8 % (ref 36.0–46.0)
Hemoglobin: 14 g/dL (ref 12.0–15.0)
Lymphocytes Relative: 32.9 % (ref 12.0–46.0)
Lymphs Abs: 5.2 10*3/uL — ABNORMAL HIGH (ref 0.7–4.0)
MCHC: 32 g/dL (ref 30.0–36.0)
MCV: 82.2 fl (ref 78.0–100.0)
Monocytes Absolute: 1.1 10*3/uL — ABNORMAL HIGH (ref 0.1–1.0)
Monocytes Relative: 7.1 % (ref 3.0–12.0)
Neutro Abs: 9.4 10*3/uL — ABNORMAL HIGH (ref 1.4–7.7)
Neutrophils Relative %: 59.8 % (ref 43.0–77.0)
Platelets: 411 10*3/uL — ABNORMAL HIGH (ref 150.0–400.0)
RBC: 5.33 Mil/uL — ABNORMAL HIGH (ref 3.87–5.11)
RDW: 15.1 % (ref 11.5–15.5)
WBC: 15.7 10*3/uL — ABNORMAL HIGH (ref 4.0–10.5)

## 2020-12-13 NOTE — Progress Notes (Signed)
Slight increase in LFTs. Watching on imuran may be related to dose increase. If sustained elevation may need to back down on dosing. Will monitor with next labs.

## 2020-12-15 ENCOUNTER — Other Ambulatory Visit: Payer: Self-pay

## 2020-12-28 ENCOUNTER — Other Ambulatory Visit: Payer: Self-pay | Admitting: Critical Care Medicine

## 2020-12-28 ENCOUNTER — Other Ambulatory Visit: Payer: Self-pay | Admitting: Internal Medicine

## 2020-12-28 ENCOUNTER — Other Ambulatory Visit: Payer: Self-pay

## 2020-12-28 DIAGNOSIS — J849 Interstitial pulmonary disease, unspecified: Secondary | ICD-10-CM

## 2020-12-28 MED ORDER — IBUPROFEN 600 MG PO TABS
ORAL_TABLET | ORAL | 0 refills | Status: DC
  Filled 2020-12-28: qty 21, 7d supply, fill #0

## 2020-12-30 ENCOUNTER — Other Ambulatory Visit: Payer: No Typology Code available for payment source

## 2020-12-30 ENCOUNTER — Other Ambulatory Visit: Payer: Self-pay

## 2020-12-30 DIAGNOSIS — J679 Hypersensitivity pneumonitis due to unspecified organic dust: Secondary | ICD-10-CM

## 2020-12-30 DIAGNOSIS — Z5181 Encounter for therapeutic drug level monitoring: Secondary | ICD-10-CM

## 2020-12-30 LAB — COMPREHENSIVE METABOLIC PANEL
ALT: 23 U/L (ref 0–35)
AST: 17 U/L (ref 0–37)
Albumin: 4 g/dL (ref 3.5–5.2)
Alkaline Phosphatase: 125 U/L — ABNORMAL HIGH (ref 39–117)
BUN: 18 mg/dL (ref 6–23)
CO2: 26 mEq/L (ref 19–32)
Calcium: 9.3 mg/dL (ref 8.4–10.5)
Chloride: 104 mEq/L (ref 96–112)
Creatinine, Ser: 0.71 mg/dL (ref 0.40–1.20)
GFR: 89.82 mL/min (ref >60.00)
Glucose, Bld: 80 mg/dL (ref 70–99)
Potassium: 3.5 mEq/L (ref 3.5–5.1)
Sodium: 137 mEq/L (ref 135–145)
Total Bilirubin: 0.3 mg/dL (ref 0.2–1.2)
Total Protein: 6.7 g/dL (ref 6.0–8.3)

## 2020-12-30 LAB — CBC WITH DIFFERENTIAL/PLATELET
Basophils Absolute: 0 10*3/uL (ref 0.0–0.1)
Basophils Relative: 0.5 % (ref 0.0–3.0)
Eosinophils Absolute: 0 10*3/uL (ref 0.0–0.7)
Eosinophils Relative: 0 % (ref 0.0–5.0)
HCT: 43 % (ref 36.0–46.0)
Hemoglobin: 14 g/dL (ref 12.0–15.0)
Lymphocytes Relative: 37.3 % (ref 12.0–46.0)
Lymphs Abs: 3.5 10*3/uL (ref 0.7–4.0)
MCHC: 32.5 g/dL (ref 30.0–36.0)
MCV: 80.3 fl (ref 78.0–100.0)
Monocytes Absolute: 0.9 10*3/uL (ref 0.1–1.0)
Monocytes Relative: 10 % (ref 3.0–12.0)
Neutro Abs: 4.9 10*3/uL (ref 1.4–7.7)
Neutrophils Relative %: 52.2 % (ref 43.0–77.0)
Platelets: 414 10*3/uL — ABNORMAL HIGH (ref 150.0–400.0)
RBC: 5.35 Mil/uL — ABNORMAL HIGH (ref 3.87–5.11)
RDW: 16.1 % — ABNORMAL HIGH (ref 11.5–15.5)
WBC: 9.5 10*3/uL (ref 4.0–10.5)

## 2020-12-30 MED ORDER — ALBUTEROL SULFATE HFA 108 (90 BASE) MCG/ACT IN AERS
INHALATION_SPRAY | RESPIRATORY_TRACT | 11 refills | Status: DC
  Filled 2020-12-30: qty 18, 25d supply, fill #0

## 2020-12-30 MED ORDER — BUDESONIDE-FORMOTEROL FUMARATE 80-4.5 MCG/ACT IN AERO
2.0000 | INHALATION_SPRAY | Freq: Two times a day (BID) | RESPIRATORY_TRACT | 0 refills | Status: DC
  Filled 2020-12-30: qty 10.2, 30d supply, fill #0

## 2020-12-30 NOTE — Telephone Encounter (Signed)
Requested Prescriptions  Pending Prescriptions Disp Refills  . budesonide-formoterol (SYMBICORT) 80-4.5 MCG/ACT inhaler 10.2 g 0    Sig: Inhale 2 puffs into the lungs 2 (two) times daily.     Pulmonology:  Combination Products Passed - 12/28/2020 12:19 PM      Passed - Valid encounter within last 12 months    Recent Outpatient Visits          2 months ago Controlled type 2 diabetes mellitus with diabetic polyneuropathy, without long-term current use of insulin (HCC)   [place] [facility] [place], Gavin Pound B, MD   5 months ago Controlled type 2 diabetes mellitus with diabetic polyneuropathy, without long-term current use of insulin (HCC)   [place] [facility] Hoy Register, MD   1 year ago Need for influenza vaccination   [facility] Drucilla Chalet, RPH-CPP   1 year ago Controlled type 2 diabetes mellitus with diabetic polyneuropathy, without long-term current use of insulin [facility])   [place] [facility] Jonah Blue B, MD   1 year ago Controlled type 2 diabetes mellitus with diabetic polyneuropathy, without long-term current use of insulin [facility])   [place] [facility] Marcine Matar, MD      Future Appointments            In 1 month Laural Benes, Binnie Rail, MD [facility]

## 2021-01-02 ENCOUNTER — Other Ambulatory Visit: Payer: Self-pay

## 2021-01-06 ENCOUNTER — Other Ambulatory Visit: Payer: Self-pay

## 2021-01-09 ENCOUNTER — Other Ambulatory Visit: Payer: Self-pay

## 2021-01-11 ENCOUNTER — Other Ambulatory Visit: Payer: Self-pay

## 2021-01-11 DIAGNOSIS — Z1231 Encounter for screening mammogram for malignant neoplasm of breast: Secondary | ICD-10-CM

## 2021-01-19 ENCOUNTER — Other Ambulatory Visit: Payer: Self-pay

## 2021-01-20 ENCOUNTER — Other Ambulatory Visit: Payer: Self-pay

## 2021-01-24 ENCOUNTER — Other Ambulatory Visit (INDEPENDENT_AMBULATORY_CARE_PROVIDER_SITE_OTHER): Payer: No Typology Code available for payment source

## 2021-01-24 DIAGNOSIS — J679 Hypersensitivity pneumonitis due to unspecified organic dust: Secondary | ICD-10-CM

## 2021-01-24 DIAGNOSIS — Z5181 Encounter for therapeutic drug level monitoring: Secondary | ICD-10-CM

## 2021-01-24 LAB — COMPREHENSIVE METABOLIC PANEL
ALT: 62 U/L — ABNORMAL HIGH (ref 0–35)
AST: 36 U/L (ref 0–37)
Albumin: 4.2 g/dL (ref 3.5–5.2)
Alkaline Phosphatase: 319 U/L — ABNORMAL HIGH (ref 39–117)
BUN: 17 mg/dL (ref 6–23)
CO2: 23 mEq/L (ref 19–32)
Calcium: 9.9 mg/dL (ref 8.4–10.5)
Chloride: 104 mEq/L (ref 96–112)
Creatinine, Ser: 0.86 mg/dL (ref 0.40–1.20)
GFR: 71.33 mL/min (ref >60.00)
Glucose, Bld: 140 mg/dL — ABNORMAL HIGH (ref 70–99)
Potassium: 3.4 mEq/L — ABNORMAL LOW (ref 3.5–5.1)
Sodium: 136 mEq/L (ref 135–145)
Total Bilirubin: 0.2 mg/dL (ref 0.2–1.2)
Total Protein: 7.3 g/dL (ref 6.0–8.3)

## 2021-01-24 LAB — CBC WITH DIFFERENTIAL/PLATELET
Basophils Absolute: 0 10*3/uL (ref 0.0–0.1)
Basophils Relative: 0.3 % (ref 0.0–3.0)
Eosinophils Absolute: 0 10*3/uL (ref 0.0–0.7)
Eosinophils Relative: 0 % (ref 0.0–5.0)
HCT: 44.1 % (ref 36.0–46.0)
Hemoglobin: 14.3 g/dL (ref 12.0–15.0)
Lymphocytes Relative: 20.5 % (ref 12.0–46.0)
Lymphs Abs: 2.3 10*3/uL (ref 0.7–4.0)
MCHC: 32.5 g/dL (ref 30.0–36.0)
MCV: 80.6 fl (ref 78.0–100.0)
Monocytes Absolute: 0.7 10*3/uL (ref 0.1–1.0)
Monocytes Relative: 6.3 % (ref 3.0–12.0)
Neutro Abs: 8.4 10*3/uL — ABNORMAL HIGH (ref 1.4–7.7)
Neutrophils Relative %: 72.9 % (ref 43.0–77.0)
Platelets: 395 10*3/uL (ref 150.0–400.0)
RBC: 5.48 Mil/uL — ABNORMAL HIGH (ref 3.87–5.11)
RDW: 15.9 % — ABNORMAL HIGH (ref 11.5–15.5)
WBC: 11.5 10*3/uL — ABNORMAL HIGH (ref 4.0–10.5)

## 2021-01-24 NOTE — Telephone Encounter (Signed)
I placed the CBC and CMET order per Dr. Celine Mans last note. Nothing further needed.

## 2021-01-24 NOTE — Addendum Note (Signed)
Addended by: Arvilla Market on: 01/24/2021 10:51 AM   Modules accepted: Orders

## 2021-01-28 LAB — HYPERSENSITIVITY PNEUMONITIS
A. Pullulans Abs: NEGATIVE
A.Fumigatus #1 Abs: NEGATIVE
Micropolyspora faeni, IgG: NEGATIVE
Pigeon Serum Abs: NEGATIVE
Thermoact. Saccharii: NEGATIVE
Thermoactinomyces vulgaris, IgG: NEGATIVE

## 2021-02-14 ENCOUNTER — Other Ambulatory Visit: Payer: Self-pay

## 2021-02-14 ENCOUNTER — Encounter: Payer: Self-pay | Admitting: Internal Medicine

## 2021-02-14 ENCOUNTER — Ambulatory Visit: Payer: Self-pay | Attending: Internal Medicine | Admitting: Internal Medicine

## 2021-02-14 VITALS — BP 184/84 | HR 66 | Resp 16 | Wt 130.4 lb

## 2021-02-14 DIAGNOSIS — K219 Gastro-esophageal reflux disease without esophagitis: Secondary | ICD-10-CM

## 2021-02-14 DIAGNOSIS — M5430 Sciatica, unspecified side: Secondary | ICD-10-CM

## 2021-02-14 DIAGNOSIS — J679 Hypersensitivity pneumonitis due to unspecified organic dust: Secondary | ICD-10-CM

## 2021-02-14 DIAGNOSIS — E876 Hypokalemia: Secondary | ICD-10-CM

## 2021-02-14 DIAGNOSIS — I10 Essential (primary) hypertension: Secondary | ICD-10-CM

## 2021-02-14 DIAGNOSIS — E1142 Type 2 diabetes mellitus with diabetic polyneuropathy: Secondary | ICD-10-CM

## 2021-02-14 LAB — POCT GLYCOSYLATED HEMOGLOBIN (HGB A1C): HbA1c, POC (controlled diabetic range): 6.6 % (ref 0.0–7.0)

## 2021-02-14 MED ORDER — METFORMIN HCL 500 MG PO TABS
ORAL_TABLET | Freq: Every day | ORAL | 3 refills | Status: DC
  Filled 2021-02-14: qty 30, 30d supply, fill #0

## 2021-02-14 MED ORDER — GABAPENTIN 100 MG PO CAPS
ORAL_CAPSULE | ORAL | 5 refills | Status: DC
  Filled 2021-02-14: qty 120, 30d supply, fill #0
  Filled 2021-06-16: qty 120, 30d supply, fill #1
  Filled 2021-10-13: qty 120, 30d supply, fill #2
  Filled 2022-01-15: qty 120, 30d supply, fill #3

## 2021-02-14 MED ORDER — OMEPRAZOLE 20 MG PO CPDR
20.0000 mg | DELAYED_RELEASE_CAPSULE | Freq: Two times a day (BID) | ORAL | 6 refills | Status: DC
  Filled 2021-02-14: qty 60, 30d supply, fill #0
  Filled 2021-03-17: qty 60, 30d supply, fill #1
  Filled 2021-05-01: qty 60, 30d supply, fill #2
  Filled 2021-06-16: qty 60, 30d supply, fill #3
  Filled 2021-08-14: qty 60, 30d supply, fill #4
  Filled 2021-10-13: qty 60, 30d supply, fill #5
  Filled 2022-01-15: qty 60, 30d supply, fill #6

## 2021-02-14 MED ORDER — CARVEDILOL 12.5 MG PO TABS
ORAL_TABLET | Freq: Two times a day (BID) | ORAL | 6 refills | Status: DC
  Filled 2021-02-14: qty 60, 30d supply, fill #0
  Filled 2021-06-02: qty 60, 30d supply, fill #1
  Filled 2021-08-14: qty 60, 30d supply, fill #2
  Filled 2021-10-13: qty 60, 30d supply, fill #3
  Filled 2022-01-15: qty 60, 30d supply, fill #4

## 2021-02-14 NOTE — Progress Notes (Signed)
Patient ID: Tammy Osborne, female    DOB: 1955/02/27  MRN: [id number]  CC: Diabetes and Hypertension   Subjective: Tammy Osborne is a 66 y.o. female who presents for chronic ds management Her concerns today include:  Patient with history of HTN,  DM 2, probable ILD, HL, anemia, GERD, aortic atherosclerosis  Patient has some but not all of her medicines with her.  She states that she was in a rush to get out the door today to come to this appointment and did not have time to put all of her medicines in the bag.  ILD:  saw Dr. Shearon Stalls 10/2020.  Pt was continued on Prednisone with Bactrim MWF.  Started on AZP 150mg . She tells me one of the meds was d/c because it was causing worsening heartburn.  However she tells me she is still taking all 3 meds.  Looks like he plans to wean Prednisone. She avoids foods that can make heartburn worse.  She feels Omeprazole 20 mg daily not working any more.  Takes Ibuprofen "when my back pain is really really bad."  About 3 times a week.  She notes that ibuprofen also makes her heartburn worse.  DIABETES TYPE 2 Last A1C:   Results for orders placed or performed in visit on 02/14/21  POCT glycosylated hemoglobin (Hb A1C)  Result Value Ref Range   Hemoglobin A1C     HbA1c POC (<> result, manual entry)     HbA1c, POC (prediabetic range)     HbA1c, POC (controlled diabetic range) 6.6 0.0 - 7.0 %    Med Adherence:  [x]  Yes - reports taking Metformin    []  No Medication side effects:  []  Yes    [x]  No Home Monitoring?  []  Yes    [x]  No Home glucose results range: Diet Adherence: [x]  Yes    []  No Exercise: []  Yes    []  No Last eye exam: over due for eye exam.  Limited by no insurance. Comments:   HYPERTENSION Currently taking: see medication list.  Supposed to be on Norvasc, Coreg and Cozaar.  She only has amlodipine bottle with her today.  States she did not bring the others because she was in a rush. Med Adherence: [x]  Yes but did not  take meds as yet today.  Looks like she is out of RF of Coreg  Medication side effects: []  Yes    [x]  No Adherence with salt restriction: [x]  Yes    []  No Home Monitoring?: []  Yes    []  No Monitoring Frequency:  Home BP results range:  SOB? []  Yes    []  No Chest Pain?: []  Yes    [x]  No Leg swelling?: []  Yes    [x]  No Headaches?: []  Yes    [x]  No Dizziness? []  Yes    [x]  No Comments: noted to have low K+ level on last 2 chem done by her pulmonary specialist  HL:  on Crestor.  Reports taking and tolerating  HM:  Due for COVID booster and Shingles vaccine.  She declines shingles vaccine Patient Active Problem List   Diagnosis Date Noted   Aortic atherosclerosis ([place]) 09/21/2019   Hyperlipidemia associated with type 2 diabetes mellitus ([place]) 09/21/2019   Gastroesophageal reflux disease without esophagitis 04/04/2018   Controlled type 2 diabetes mellitus with diabetic polyneuropathy, without long-term current use of insulin ([place]) 10/19/2016   Essential hypertension 09/06/2016   Interstitial pulmonary disease ([place]) 09/06/2016   Cough in adult  09/06/2016   Anemia 09/06/2016   Hyperlipidemia 09/06/2016   Numbness of lower limb 09/06/2016     Current Outpatient Medications on File Prior to Visit  Medication Sig Dispense Refill   albuterol (VENTOLIN HFA) 108 (90 Base) MCG/ACT inhaler INHALE 2 PUFFS INTO THE LUNGS EVERY 4 (FOUR) HOURS AS NEEDED. 18 g 11   amLODipine (NORVASC) 10 MG tablet TAKE 1 TABLET (10 MG TOTAL) BY MOUTH DAILY. 30 tablet 6   aspirin EC 81 MG tablet Take 1 tablet (81 mg total) by mouth daily. 100 tablet 1   azaTHIOprine (IMURAN) 50 MG tablet Take 3 tablets (150 mg total) by mouth daily. 90 tablet 5   budesonide-formoterol (SYMBICORT) 80-4.5 MCG/ACT inhaler Inhale 2 puffs into the lungs 2 (two) times daily. 10.2 g 0   carvedilol (COREG) 12.5 MG tablet TAKE 1 TABLET (12.5 MG TOTAL) BY MOUTH 2 (TWO) TIMES DAILY WITH A MEAL. 60 tablet 0   gabapentin (NEURONTIN) 100 MG  capsule Take 2 capsules by mouth at bedtime for 5 days then 2 tabs twice daily 120 capsule 1   ibuprofen (ADVIL) 600 MG tablet take 1 tab by mouth three times daily as needed with food 21 tablet 0   losartan (COZAAR) 25 MG tablet TAKE 1 TABLET (25 MG TOTAL) BY MOUTH DAILY. 30 tablet 6   omeprazole (PRILOSEC) 20 MG capsule Take 1 capsule (20 mg total) by mouth daily. 30 capsule 6   predniSONE (DELTASONE) 20 MG tablet Take 1 tablet (20 mg total) by mouth daily with breakfast. 30 tablet 3   rosuvastatin (CRESTOR) 20 MG tablet Take 1 tablet (20 mg total) by mouth daily. 90 tablet 1   sulfamethoxazole-trimethoprim (BACTRIM DS) 800-160 MG tablet Take 1 tablet by mouth every Monday, Wednesday, and Friday. (Patient not taking: Reported on 02/14/2021) 12 tablet 3   No current facility-administered medications on file prior to visit.    No Known Allergies  Social History   Socioeconomic History   Marital status: Single    Spouse name: Not on file   Number of children: 2   Years of education: Not on file   Highest education level: Not on file  Occupational History   Not on file  Tobacco Use   Smoking status: Never    Passive exposure: Yes   Smokeless tobacco: Never   Tobacco comments:    spouse smoked in home X6-7 years.   Vaping Use   Vaping Use: Never used  Substance and Sexual Activity   Alcohol use: No   Drug use: Not on file   Sexual activity: Not on file  Other Topics Concern   Not on file  Social History Narrative   Not on file   Social Determinants of Health   Financial Resource Strain: Not on file  Food Insecurity: Not on file  Transportation Needs: Not on file  Physical Activity: Not on file  Stress: Not on file  Social Connections: Not on file  Intimate Partner Violence: Not on file    Family History  Problem Relation Age of Onset   Hypertension Mother     Past Surgical History:  Procedure Laterality Date   ABDOMINAL HYSTERECTOMY      ROS: Review of  Systems Negative except as stated above  PHYSICAL EXAM: BP (!) 184/84    Pulse 66    Resp 16    Wt 130 lb 6.4 oz (59.1 kg)    LMP  (LMP Unknown)    SpO2 95%  BMI 26.79 kg/m   Physical Exam Repeat blood pressure is 180/90. General appearance - alert, well appearing, and in no distress Mental status - normal mood, behavior, speech, dress, motor activity, and thought processes Neck - supple, no significant adenopathy Chest -patient with some Velcro fine crackles in the base of both lungs.  This has been a chronic finding. Heart - normal rate, regular rhythm, normal S1, S2, no murmurs, rubs, clicks or gallops Extremities - peripheral pulses normal, no pedal edema, no clubbing or cyanosis   CMP Latest Ref Rng & Units 01/24/2021 12/30/2020 12/13/2020  Glucose 70 - 99 mg/dL 140(H) 80 150(H)  BUN 6 - 23 mg/dL 17 18 16   Creatinine 0.40 - 1.20 mg/dL 0.86 0.71 0.69  Sodium 135 - 145 mEq/L 136 137 137  Potassium 3.5 - 5.1 mEq/L 3.4(L) 3.5 3.1(L)  Chloride 96 - 112 mEq/L 104 104 100  CO2 19 - 32 mEq/L 23 26 26   Calcium 8.4 - 10.5 mg/dL 9.9 9.3 9.4  Total Protein 6.0 - 8.3 g/dL 7.3 6.7 7.4  Total Bilirubin 0.2 - 1.2 mg/dL 0.2 0.3 0.4  Alkaline Phos 39 - 117 U/L 319(H) 125(H) 119(H)  AST 0 - 37 U/L 36 17 46(H)  ALT 0 - 35 U/L 62(H) 23 66(H)   Lipid Panel     Component Value Date/Time   CHOL 263 (H) 07/21/2020 0838   TRIG 234 (H) 07/21/2020 0838   HDL 72 07/21/2020 0838   CHOLHDL 3.7 07/21/2020 0838   LDLCALC 149 (H) 07/21/2020 0838    CBC    Component Value Date/Time   WBC 11.5 (H) 01/24/2021 1051   RBC 5.48 (H) 01/24/2021 1051   HGB 14.3 01/24/2021 1051   HGB 15.5 09/21/2019 1210   HCT 44.1 01/24/2021 1051   HCT 46.5 09/21/2019 1210   PLT 395.0 01/24/2021 1051   PLT 423 02/03/2019 0924   MCV 80.6 01/24/2021 1051   MCV 80 09/21/2019 1210   MCH 26.8 09/21/2019 1210   MCH 27.0 08/15/2017 0611   MCHC 32.5 01/24/2021 1051   RDW 15.9 (H) 01/24/2021 1051   RDW 17.2 (H)  09/21/2019 1210   LYMPHSABS 2.3 01/24/2021 1051   LYMPHSABS 5.0 (H) 09/21/2019 1210   MONOABS 0.7 01/24/2021 1051   EOSABS 0.0 01/24/2021 1051   EOSABS 0.0 09/21/2019 1210   BASOSABS 0.0 01/24/2021 1051   BASOSABS 0.1 09/21/2019 1210    ASSESSMENT AND PLAN: 1. Controlled type 2 diabetes mellitus with diabetic polyneuropathy, without long-term current use of insulin (HCC) Continue metformin and healthy eating habits.  Encouraged her to move as much as she can. - POCT glycosylated hemoglobin (Hb A1C) - metFORMIN (GLUCOPHAGE) 500 MG tablet; TAKE 1 TABLET (500 MG TOTAL) BY MOUTH DAILY WITH BREAKFAST.  Dispense: 90 tablet; Refill: 3  2. Essential hypertension Not at goal.  She has not taken her medicines as yet for the morning.  She will take them as soon as she returns home. - carvedilol (COREG) 12.5 MG tablet; TAKE 1 TABLET (12.5 MG TOTAL) BY MOUTH 2 (TWO) TIMES DAILY WITH A MEAL.  Dispense: 60 tablet; Refill: 6  3. Gastroesophageal reflux disease without esophagitis Worsening likely due to ibuprofen and prednisone.  Recommend discontinue ibuprofen and use Tylenol instead.Marland Kitchen GERD precautions discussed and encouraged. Increase omeprazole to 20 mg twice a day. - omeprazole (PRILOSEC) 20 MG capsule; Take 1 capsule (20 mg total) by mouth 2 (two) times daily before a meal.  Dispense: 60 capsule; Refill: 6  4. 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  She is on azathioprine, prednisone and Bactrim.  Specialist plans to wean the prednisone.  5. Hypokalemia Recheck potassium level.  If still low we will supplement.  Always questions of hyperaldosterone state in patients with hypertension and low potassium level. - Potassium  6. Sciatic leg pain - gabapentin (NEURONTIN) 100 MG capsule; take 2 tabs by mouth twice daily  Dispense: 120 capsule; Refill: 5    [employer] Language interpreter used during this encounter. [id number]  Patient was given the opportunity to ask questions.   Patient verbalized understanding of the plan and was able to repeat key elements of the plan.   Orders Placed This Encounter  Procedures   POCT glycosylated hemoglobin (Hb A1C)     Requested Prescriptions    No prescriptions requested or ordered in this encounter    No follow-ups on file.  Karle Plumber, MD, FACP

## 2021-02-15 ENCOUNTER — Telehealth: Payer: Self-pay

## 2021-02-15 ENCOUNTER — Other Ambulatory Visit: Payer: Self-pay

## 2021-02-15 LAB — POTASSIUM: Potassium: 4.4 mmol/L (ref 3.5–5.2)

## 2021-02-15 NOTE — Telephone Encounter (Signed)
Contacted pt to go over lab results pt didn't answer some man answered the phone and the interperter was introducing herself and the man hung up  [place] interperter: [place] ID: [phone]  Sent a CRM and forward labs to NT to give pt labs when they call back

## 2021-02-27 ENCOUNTER — Other Ambulatory Visit (INDEPENDENT_AMBULATORY_CARE_PROVIDER_SITE_OTHER): Payer: No Typology Code available for payment source

## 2021-02-27 ENCOUNTER — Other Ambulatory Visit: Payer: Self-pay

## 2021-02-27 DIAGNOSIS — Z79899 Other long term (current) drug therapy: Secondary | ICD-10-CM

## 2021-02-27 LAB — COMPREHENSIVE METABOLIC PANEL
ALT: 24 U/L (ref 0–35)
AST: 22 U/L (ref 0–37)
Albumin: 4 g/dL (ref 3.5–5.2)
Alkaline Phosphatase: 118 U/L — ABNORMAL HIGH (ref 39–117)
BUN: 19 mg/dL (ref 6–23)
CO2: 25 mEq/L (ref 19–32)
Calcium: 9.1 mg/dL (ref 8.4–10.5)
Chloride: 105 mEq/L (ref 96–112)
Creatinine, Ser: 0.82 mg/dL (ref 0.40–1.20)
GFR: 75.47 mL/min (ref >60.00)
Glucose, Bld: 102 mg/dL — ABNORMAL HIGH (ref 70–99)
Potassium: 3.5 mEq/L (ref 3.5–5.1)
Sodium: 140 mEq/L (ref 135–145)
Total Bilirubin: 0.3 mg/dL (ref 0.2–1.2)
Total Protein: 6.6 g/dL (ref 6.0–8.3)

## 2021-02-27 LAB — CBC WITH DIFFERENTIAL/PLATELET
Basophils Absolute: 0 10*3/uL (ref 0.0–0.1)
Basophils Relative: 0.4 % (ref 0.0–3.0)
Eosinophils Absolute: 0 10*3/uL (ref 0.0–0.7)
Eosinophils Relative: 0.1 % (ref 0.0–5.0)
HCT: 40.3 % (ref 36.0–46.0)
Hemoglobin: 13.3 g/dL (ref 12.0–15.0)
Lymphocytes Relative: 38.3 % (ref 12.0–46.0)
Lymphs Abs: 3.4 10*3/uL (ref 0.7–4.0)
MCHC: 33 g/dL (ref 30.0–36.0)
MCV: 80.2 fl (ref 78.0–100.0)
Monocytes Absolute: 0.9 10*3/uL (ref 0.1–1.0)
Monocytes Relative: 10.1 % (ref 3.0–12.0)
Neutro Abs: 4.5 10*3/uL (ref 1.4–7.7)
Neutrophils Relative %: 51.1 % (ref 43.0–77.0)
Platelets: 356 10*3/uL (ref 150.0–400.0)
RBC: 5.02 Mil/uL (ref 3.87–5.11)
RDW: 16 % — ABNORMAL HIGH (ref 11.5–15.5)
WBC: 8.8 10*3/uL (ref 4.0–10.5)

## 2021-03-01 NOTE — Addendum Note (Signed)
Addended by: Jonna Clark E on: 03/01/2021 10:45 AM   Modules accepted: Orders

## 2021-03-07 ENCOUNTER — Encounter: Payer: Self-pay | Admitting: Internal Medicine

## 2021-03-07 ENCOUNTER — Ambulatory Visit (INDEPENDENT_AMBULATORY_CARE_PROVIDER_SITE_OTHER): Payer: No Typology Code available for payment source | Admitting: Internal Medicine

## 2021-03-07 ENCOUNTER — Other Ambulatory Visit: Payer: Self-pay

## 2021-03-07 VITALS — BP 126/76 | HR 74 | Temp 97.9°F | Ht 58.5 in | Wt 131.6 lb

## 2021-03-07 DIAGNOSIS — K716 Toxic liver disease with hepatitis, not elsewhere classified: Secondary | ICD-10-CM

## 2021-03-07 DIAGNOSIS — J679 Hypersensitivity pneumonitis due to unspecified organic dust: Secondary | ICD-10-CM

## 2021-03-07 DIAGNOSIS — J849 Interstitial pulmonary disease, unspecified: Secondary | ICD-10-CM

## 2021-03-07 DIAGNOSIS — T50905A Adverse effect of unspecified drugs, medicaments and biological substances, initial encounter: Secondary | ICD-10-CM

## 2021-03-07 MED ORDER — MYCOPHENOLATE MOFETIL 500 MG PO TABS
500.0000 mg | ORAL_TABLET | Freq: Two times a day (BID) | ORAL | 5 refills | Status: DC
  Filled 2021-03-07 – 2021-03-17 (×2): qty 60, 30d supply, fill #0
  Filled 2021-05-01: qty 60, 30d supply, fill #1
  Filled 2021-06-16: qty 60, 30d supply, fill #2

## 2021-03-07 MED ORDER — PREDNISONE 10 MG PO TABS
10.0000 mg | ORAL_TABLET | Freq: Every day | ORAL | 1 refills | Status: DC
  Filled 2021-03-07 – 2021-03-17 (×2): qty 30, 30d supply, fill #0

## 2021-03-07 MED ORDER — BUDESONIDE-FORMOTEROL FUMARATE 80-4.5 MCG/ACT IN AERO
2.0000 | INHALATION_SPRAY | Freq: Two times a day (BID) | RESPIRATORY_TRACT | 5 refills | Status: DC
  Filled 2021-03-07 – 2021-03-17 (×2): qty 10.2, 30d supply, fill #0

## 2021-03-07 NOTE — Progress Notes (Signed)
Tammy Osborne    [id number]    1955/05/21  Primary Care Physician:Johnson, Dalbert Batman, MD Date of Appointment: 03/07/2021 Established Patient Visit  Chief complaint:   Chief Complaint  Patient presents with   Follow-up    She reports she is doing well with her breathing and no issues.       HPI: Tammy Osborne is a 66 y.o. woman spanish speaking who presents for hypersensitivity pneumonitis.  Interpreter present.   Interval Updates: Here for follow up.  She was azathioprine and was up to three pills but developed worsening abdominal pain and had to stop. When her Alk Phos was 3 times the upper limit of normal she was on 2 pills. When her labs were checked one week ago she was off the medication.   She is still on prednisone 20 mg daily and bactrim.  She is still taking symbicort.   Her breathing feels good.    Social History: Originally from [place], moved here around 2007. Never smoker, but did have indoor wood burning stove in childhood.  Has lived in the same house for 5 years which is when  her symptoms started. With her brother sister and a nephew. No one else has breathing problems at home. She has worked in the same job for the last 5 years. No pets at home. No chickens or birds. Works at [employer] in [place]. 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 No musical instruments. No visible mold. Does not use down feather pillows. Has mice in her home, but no bats.   I have reviewed the patient's family social and past medical history and updated as appropriate.   Past Medical History:  Diagnosis Date   Diabetes mellitus without complication ([place])    Hypertension     Past Surgical History:  Procedure Laterality Date   ABDOMINAL HYSTERECTOMY      Family History  Problem Relation Age of Onset   Hypertension Mother     Social History   Occupational History   Not on file  Tobacco Use   Smoking status:  Never    Passive exposure: Yes   Smokeless tobacco: Never   Tobacco comments:    spouse smoked in home X6-7 years.   Vaping Use   Vaping Use: Never used  Substance and Sexual Activity   Alcohol use: No   Drug use: Not on file   Sexual activity: Not on file     Physical Exam: Blood pressure 126/76, pulse 74, temperature 97.9 F (36.6 C), temperature source Oral, height 4' 10.5" (1.486 m), weight 131 lb 9.6 oz (59.7 kg), SpO2 98 %.  Gen:      No acute distress Lungs:   ctab no wheezes or crackles CV:         RRR no mrg   Data Reviewed: Imaging: I have personally reviewed the CT scan from Sept 2020 which shows mosaic attenuation. The ct chest in 2018 has diffuse GGOs which have improved in 2020.   PFTs:   PFT Results Latest Ref Rng & Units 03/23/2020 06/18/2017 11/22/2016  FVC-Pre L 1.20 1.57 1.73  FVC-Predicted Pre % 47 58 63  FVC-Post L 1.23 1.58 1.76  FVC-Predicted Post % 48 58 64  Pre FEV1/FVC % % 99 91 92  Post FEV1/FCV % % 97 92 91  FEV1-Pre L 1.18 1.42 1.59  FEV1-Predicted Pre % 61 68 76  FEV1-Post  L 1.19 1.46 1.60  DLCO uncorrected ml/min/mmHg - 11.45 11.90  DLCO UNC% % - 65 67  DLCO corrected ml/min/mmHg - - 11.75  DLCO COR %Predicted % - - 67  DLVA Predicted % - 104 98  TLC L 2.51 2.99 2.81  TLC % Predicted % 60 69 65  RV % Predicted % 45 42 55   I have personally reviewed the patient's PFTs and they show moderate restriction to ventilation. This is worse when compared to PFTs in 2018-2019.   Labs: Labs reviewed - had autoimmune serologies negative in 2018. HP panel negative.  CMP and CBC reviewed over the last few months.  Elevated AP and ALT. CBC WNL.   Immunization status: Immunization History  Administered Date(s) Administered   Influenza,inj,Quad PF,6+ Mos 10/19/2016, 09/19/2017, 11/05/2018, 11/17/2019, 10/13/2020   Moderna Sars-Covid-2 Vaccination 06/27/2019, 07/25/2019   PNEUMOCOCCAL CONJUGATE-20 07/06/2020   Pneumococcal Polysaccharide-23  10/19/2016   Tdap 12/24/2016    Assessment:  Subacute Hypersensitivity Pneumonitis, unknown etiology Encounter for drug toxicity monitoring, with evidence of hepatotoxicity from azathioprine GERD, stable  Plan/Recommendations:  Intolerant of azathioprine due to hepatotoxicity - Alk Phos was 3 times ULN and she had abdominal pain. Will switch to mycophenolate 500 mg BID. Will start tapering down prednisone 10 mg daily for 2 weeks then 10 mg every other day for 2 weeks. Continue bactrim for the next month until this is off.  Continue symbicort and albuterol Continue ppi for reflux.    Return to Care: Return in about 2 months (around 05/05/2021).   Lenice Llamas, MD Pulmonary and [place]

## 2021-03-07 NOTE — Patient Instructions (Addendum)
Please schedule follow up scheduled with myself in 2 months.  If my schedule is not open yet, we will contact you with a reminder closer to that time. Please call [phone] if you haven't heard from [place] a month before.    Start taking cell cept 500 mg twice a day.   One pill in the morning, one pill at night.  Side effect is diarrhea and abdominal pain.   Continue symbicort as needed - but no more than twice a day.   Prednisone taper: Start coming down on the prednisone:   10 mg daily for the next two weeks.   Then 10 mg every other day for the next two weeks.   Then discontinue.   When you are done with the prednisone, you can stop the bactrim. (After 1 more month total)

## 2021-03-14 ENCOUNTER — Other Ambulatory Visit: Payer: Self-pay

## 2021-03-17 ENCOUNTER — Other Ambulatory Visit: Payer: Self-pay

## 2021-04-18 ENCOUNTER — Ambulatory Visit: Payer: No Typology Code available for payment source

## 2021-05-01 ENCOUNTER — Other Ambulatory Visit: Payer: Self-pay

## 2021-05-01 ENCOUNTER — Other Ambulatory Visit: Payer: Self-pay | Admitting: Family Medicine

## 2021-05-01 DIAGNOSIS — I10 Essential (primary) hypertension: Secondary | ICD-10-CM

## 2021-05-02 ENCOUNTER — Other Ambulatory Visit: Payer: Self-pay

## 2021-05-02 MED ORDER — AMLODIPINE BESYLATE 10 MG PO TABS
ORAL_TABLET | Freq: Every day | ORAL | 2 refills | Status: DC
  Filled 2021-05-02: qty 30, 30d supply, fill #0

## 2021-05-02 NOTE — Telephone Encounter (Signed)
Requested Prescriptions  ?Pending Prescriptions Disp Refills  ?? amLODipine (NORVASC) 10 MG tablet 30 tablet 2  ?  Sig: TAKE 1 TABLET (10 MG TOTAL) BY MOUTH DAILY.  ?  ? Cardiovascular: Calcium Channel Blockers 2 Passed - 05/01/2021 11:13 AM  ?  ?  Passed - Last BP in normal range  ?  BP Readings from Last 1 Encounters:  ?03/07/21 126/76  ?   ?  ?  Passed - Last Heart Rate in normal range  ?  Pulse Readings from Last 1 Encounters:  ?03/07/21 74  ?   ?  ?  Passed - Valid encounter within last 6 months  ?  Recent Outpatient Visits   ?      ? 2 months ago Controlled type 2 diabetes mellitus with diabetic polyneuropathy, without long-term current use of insulin ([place])  ? [place] Ladell Pier, MD  ? 6 months ago Controlled type 2 diabetes mellitus with diabetic polyneuropathy, without long-term current use of insulin ([place])  ? [facility] [place] Ladell Pier, MD  ? 10 months ago Controlled type 2 diabetes mellitus with diabetic polyneuropathy, without long-term current use of insulin ([place])  ? [place] Charlott Rakes, MD  ? 1 year ago Need for influenza vaccination  ? [place], RPH-CPP  ? 1 year ago Controlled type 2 diabetes mellitus with diabetic polyneuropathy, without long-term current use of insulin ([place])  ? [place] Ladell Pier, MD  ?  ?  ?Future Appointments   ?        ? In 2 weeks Spero Geralds, MD [facility]  ? In 1 month Ladell Pier, MD [place]  ?  ? ?  ?  ?  ? ?

## 2021-05-09 ENCOUNTER — Other Ambulatory Visit: Payer: Self-pay

## 2021-05-22 ENCOUNTER — Ambulatory Visit (INDEPENDENT_AMBULATORY_CARE_PROVIDER_SITE_OTHER): Payer: No Typology Code available for payment source | Admitting: Internal Medicine

## 2021-05-22 ENCOUNTER — Other Ambulatory Visit: Payer: Self-pay

## 2021-05-22 ENCOUNTER — Encounter: Payer: Self-pay | Admitting: Internal Medicine

## 2021-05-22 VITALS — BP 132/80 | HR 78 | Ht 58.5 in | Wt 130.0 lb

## 2021-05-22 DIAGNOSIS — J679 Hypersensitivity pneumonitis due to unspecified organic dust: Secondary | ICD-10-CM

## 2021-05-22 DIAGNOSIS — I10 Essential (primary) hypertension: Secondary | ICD-10-CM

## 2021-05-22 MED ORDER — AMLODIPINE BESYLATE 10 MG PO TABS
10.0000 mg | ORAL_TABLET | Freq: Every day | ORAL | 0 refills | Status: DC
  Filled 2021-05-22 – 2021-06-02 (×2): qty 30, 30d supply, fill #0

## 2021-05-22 NOTE — Patient Instructions (Addendum)
Please schedule follow up scheduled with myself in 3 months.  If my schedule is not open yet, we will contact you with a reminder closer to that time. Please call [phone] if you haven't heard from [place] a month before.  ? ?Before your next visit I would like you to have: ? ?Spirometry and DLCO - 30 minutes ? ?Take prednisone 10 mg every other day for the next week. Then stop prednisone. ? ?Continue cell cept 500 mg twice a day.  ? ?I am refilling your blood pressure medication until you see Dr. Laural Benes next month.  ? ? ?

## 2021-05-22 NOTE — Progress Notes (Signed)
? ?      ?Tammy Osborne    [id number]    02-29-1955 ? ?Primary Care Physician:Johnson, Dalbert Batman, MD ?Date of Appointment: 05/22/2021 ?Established Patient Visit ? ?Chief complaint:   ?Chief Complaint  ?Patient presents with  ? Follow-up  ?  2 mo f/u. States she has been doing great since last visit. Denies any worsening symptoms.   ? ? ? ?HPI: ?Tammy Osborne is a 66 y.o. woman spanish speaking who presents for hypersensitivity pneumonitis.  Interpreter present. Intolerant of azathioprine due to hepatotoxicity.  ? ?Interval Updates: ?Here for follow up. She is on prednisone 10 mg and on cell cept 500 mg BID. Tolerating cell cept without any adverse effects.  ?Still working at [employer], no issues with breathing. Feeling good. Not taking inhalers.  ? ? ?Social History: ?Originally from [place], moved here around 2007. Never smoker, but did have indoor wood burning stove in childhood.  Has lived in the same house for 5 years which is when  her symptoms started. With her brother sister and a nephew. No one else has breathing problems at home. She has worked in the same job for the last 5 years. No pets at home. No chickens or birds. Works at [employer] in [place]. 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 No musical instruments. No visible mold. Does not use down feather pillows. Has mice in her home, but no bats.  ? ?I have reviewed the patient's family social and past medical history and updated as appropriate.  ? ?Past Medical History:  ?Diagnosis Date  ? Diabetes mellitus without complication ([place])   ? Hypertension   ? ? ?Past Surgical History:  ?Procedure Laterality Date  ? ABDOMINAL HYSTERECTOMY    ? ? ?Family History  ?Problem Relation Age of Onset  ? Hypertension Mother   ? ? ?Social History  ? ?Occupational History  ? Not on file  ?Tobacco Use  ? Smoking status: Never  ?  Passive exposure: Yes  ? Smokeless tobacco: Never  ? Tobacco comments:  ?  spouse  smoked in home X6-7 years.   ?Vaping Use  ? Vaping Use: Never used  ?Substance and Sexual Activity  ? Alcohol use: No  ? Drug use: Not on file  ? Sexual activity: Not on file  ? ? ? ?Physical Exam: ?Blood pressure 132/80, pulse 78, height 4' 10.5" (1.486 m), weight 130 lb (59 kg), SpO2 98 %. ? ?Gen:      No acute distress ?Lungs:   ctab no wheezes or crackles, lungs are clear ?CV:        RRR no mrg ? ? ?Data Reviewed: ?Imaging: ?I have personally reviewed the CT scan from Sept 2020 which shows mosaic attenuation. The ct chest in 2018 has diffuse GGOs which have improved in 2020.  ? ?PFTs: ? ? ? ?  Latest Ref Rng & Units 03/23/2020  ? 10:39 AM 06/18/2017  ? 10:36 AM 11/22/2016  ? 10:43 AM  ?PFT Results  ?FVC-Pre L 1.20   1.57   1.73    ?FVC-Predicted Pre % 47   58   63    ?FVC-Post L 1.23   1.58   1.76    ?FVC-Predicted Post % 48   58   64    ?Pre FEV1/FVC % % 99   91   92    ?Post FEV1/FCV % % 97   92   91    ?  FEV1-Pre L 1.18   1.42   1.59    ?FEV1-Predicted Pre % 61   68   76    ?FEV1-Post L 1.19   1.46   1.60    ?DLCO uncorrected ml/min/mmHg  11.45   11.90    ?DLCO UNC% %  65   67    ?DLCO corrected ml/min/mmHg   11.75    ?DLCO COR %Predicted %   67    ?DLVA Predicted %  104   98    ?TLC L 2.51   2.99   2.81    ?TLC % Predicted % 60   69   65    ?RV % Predicted % 45   42   55    ? ?I have personally reviewed the patient's PFTs and they show moderate restriction to ventilation. This is worse when compared to PFTs in 2018-2019.  ? ?Labs: ?Labs reviewed - had autoimmune serologies negative in 2018. HP panel negative.  ?CMP and CBC reviewed over the last few months.  ?Elevated AP and ALT. CBC WNL.  ? ?Immunization status: ?Immunization History  ?Administered Date(s) Administered  ? Influenza,inj,Quad PF,6+ Mos 10/19/2016, 09/19/2017, 11/05/2018, 11/17/2019, 10/13/2020  ? Moderna Sars-Covid-2 Vaccination 06/27/2019, 07/25/2019  ? PNEUMOCOCCAL CONJUGATE-20 07/06/2020  ? Pneumococcal Polysaccharide-23 10/19/2016  ? Tdap  12/24/2016  ? ? ?Assessment:  ?Subacute Hypersensitivity Pneumonitis, unknown etiology ?GERD, stable ? ?Plan/Recommendations: ? ?Continue cell cept 500 mg BID. Taper prednisone 10 mg every other day for one week, then discontinue.   ? ?Overdue for PFTs will get spiro and dlco at next visit ? ?Stop inhalers as they are not beneficial.  ? ?Continue PPI for reflux.  ? ? ?Return to Care: ?Return in about 3 months (around 08/21/2021). ? ? ?Lenice Llamas, MD ?Pulmonary and Critical Care Medicine ?[place] ?Office:[phone] ? ? ? ? ? ?

## 2021-05-29 ENCOUNTER — Other Ambulatory Visit: Payer: Self-pay

## 2021-06-02 ENCOUNTER — Other Ambulatory Visit: Payer: Self-pay

## 2021-06-16 ENCOUNTER — Ambulatory Visit: Payer: No Typology Code available for payment source | Attending: Internal Medicine | Admitting: Internal Medicine

## 2021-06-16 ENCOUNTER — Other Ambulatory Visit: Payer: Self-pay

## 2021-06-16 ENCOUNTER — Encounter: Payer: Self-pay | Admitting: Internal Medicine

## 2021-06-16 VITALS — BP 160/60 | HR 62 | Wt 136.6 lb

## 2021-06-16 DIAGNOSIS — M791 Myalgia, unspecified site: Secondary | ICD-10-CM

## 2021-06-16 DIAGNOSIS — E1159 Type 2 diabetes mellitus with other circulatory complications: Secondary | ICD-10-CM

## 2021-06-16 DIAGNOSIS — I152 Hypertension secondary to endocrine disorders: Secondary | ICD-10-CM

## 2021-06-16 DIAGNOSIS — E663 Overweight: Secondary | ICD-10-CM

## 2021-06-16 DIAGNOSIS — E1142 Type 2 diabetes mellitus with diabetic polyneuropathy: Secondary | ICD-10-CM

## 2021-06-16 DIAGNOSIS — I700 Atherosclerosis of aorta: Secondary | ICD-10-CM

## 2021-06-16 DIAGNOSIS — E785 Hyperlipidemia, unspecified: Secondary | ICD-10-CM

## 2021-06-16 DIAGNOSIS — E1169 Type 2 diabetes mellitus with other specified complication: Secondary | ICD-10-CM

## 2021-06-16 LAB — GLUCOSE, POCT (MANUAL RESULT ENTRY): POC Glucose: 94 mg/dl (ref 70–99)

## 2021-06-16 MED ORDER — AMLODIPINE BESYLATE 10 MG PO TABS
10.0000 mg | ORAL_TABLET | Freq: Every day | ORAL | 6 refills | Status: DC
  Filled 2021-07-17: qty 30, 30d supply, fill #0
  Filled 2021-08-14: qty 30, 30d supply, fill #1
  Filled 2021-10-13: qty 30, 30d supply, fill #2
  Filled 2021-11-21: qty 30, 30d supply, fill #3
  Filled 2022-01-04: qty 30, 30d supply, fill #4
  Filled 2022-02-08: qty 30, 30d supply, fill #5
  Filled 2022-03-22: qty 30, 30d supply, fill #6

## 2021-06-16 NOTE — Progress Notes (Signed)
Patient ID: Tammy Osborne, female    DOB: March 21, 1955  MRN: [id number]  CC: Hypertension and Medication Refill (Refill on Blood Pressure medication )   Subjective: Tammy Osborne is a 66 y.o. female who presents for chronic ds management Her concerns today include:  Patient with history of HTN,  DM 2, probable ILD, HL, anemia, GERD, aortic atherosclerosis  Pt did bring some meds with her.  Only brought meds that she needs RF on -Omeprazole, Gabapentin, Cellcept (still has RF noted on bottle).  On looking at her bottles, she still has refills remaining on all of them.  I pointed this out to her.  HTN: should be on Coreg 12.5 mg BID, Losartan 25 mg and Norvasc10 mg.  She confirms  all 3 of them. Only took Norvasc so far this a.m. Limits salt.  No CP, headaches, dizziness or lower extremity edema. She has history of aortic arthrosclerosis.  She is on Crestor.  HL: reports compliance with Crestor.  Endorses muscle aches x 3 days.  No swelling or pain in jts  DM: Results for orders placed or performed in visit on 06/16/21  POCT glucose (manual entry)  Result Value Ref Range   POC Glucose 94 70 - 99 mg/dl   Lab Results  Component Value Date   HGBA1C 6.6 02/14/2021  -suppose to be on Metformin 500 mg daily but reports she is not taking it.  Feels she is taking too many meds.  She does not check blood sugar -over due for DM eye exam.  No insurance Doing Okay with eating habits.  Wgh up 6 lbs since January.  Prednisone tapered off about 3 wks ago by her pulmonologist.  Interstitial lung disease: She is followed by Dr Celine Mans.  She tells me that she is now off prednisone.  He also discontinued inhalers as they were not helping her.  Currently on CellCept.  Denies any side effects from the medication..     Patient Active Problem List   Diagnosis Date Noted   Aortic atherosclerosis (HCC) 09/21/2019   Hyperlipidemia associated with type 2 diabetes mellitus (HCC) 09/21/2019    Gastroesophageal reflux disease without esophagitis 04/04/2018   Controlled type 2 diabetes mellitus with diabetic polyneuropathy, without long-term current use of insulin (HCC) 10/19/2016   Essential hypertension 09/06/2016   Interstitial pulmonary disease (HCC) 09/06/2016   Cough in adult 09/06/2016   Anemia 09/06/2016   Hyperlipidemia 09/06/2016   Numbness of lower limb 09/06/2016     Current Outpatient Medications on File Prior to Visit  Medication Sig Dispense Refill   albuterol (VENTOLIN HFA) 108 (90 Base) MCG/ACT inhaler INHALE 2 PUFFS INTO THE LUNGS EVERY 4 (FOUR) HOURS AS NEEDED. 18 g 11   aspirin EC 81 MG tablet Take 1 tablet (81 mg total) by mouth daily. 100 tablet 1   carvedilol (COREG) 12.5 MG tablet TAKE 1 TABLET (12.5 MG TOTAL) BY MOUTH 2 (TWO) TIMES DAILY WITH A MEAL. 60 tablet 6   gabapentin (NEURONTIN) 100 MG capsule Take 2 caps by mouth twice daily 120 capsule 5   losartan (COZAAR) 25 MG tablet TAKE 1 TABLET (25 MG TOTAL) BY MOUTH DAILY. 30 tablet 6   metFORMIN (GLUCOPHAGE) 500 MG tablet TAKE 1 TABLET (500 MG TOTAL) BY MOUTH DAILY WITH BREAKFAST. 90 tablet 3   mycophenolate (CELLCEPT) 500 MG tablet Take 1 tablet (500 mg total) by mouth 2 (two) times daily. 60 tablet 5   omeprazole (PRILOSEC) 20 MG capsule Take  1 capsule (20 mg total) by mouth 2 (two) times daily before a meal. 60 capsule 6   predniSONE (DELTASONE) 10 MG tablet Take 1 tablet (10 mg total) by mouth daily with breakfast. 30 tablet 1   rosuvastatin (CRESTOR) 20 MG tablet Take 1 tablet (20 mg total) by mouth daily. 90 tablet 1   No current facility-administered medications on file prior to visit.    No Known Allergies  Social History   Socioeconomic History   Marital status: Single    Spouse name: Not on file   Number of children: 2   Years of education: Not on file   Highest education level: Not on file  Occupational History   Not on file  Tobacco Use   Smoking status: Never    Passive exposure:  Yes   Smokeless tobacco: Never   Tobacco comments:    spouse smoked in home X6-7 years.   Vaping Use   Vaping Use: Never used  Substance and Sexual Activity   Alcohol use: No   Drug use: Not on file   Sexual activity: Not on file  Other Topics Concern   Not on file  Social History Narrative   Not on file   Social Determinants of Health   Financial Resource Strain: Not on file  Food Insecurity: Not on file  Transportation Needs: Not on file  Physical Activity: Not on file  Stress: Not on file  Social Connections: Not on file  Intimate Partner Violence: Not on file    Family History  Problem Relation Age of Onset   Hypertension Mother     Past Surgical History:  Procedure Laterality Date   ABDOMINAL HYSTERECTOMY      ROS: Review of Systems Negative except as stated above  PHYSICAL EXAM: BP (!) 160/60   Pulse 62   Wt 136 lb 9.6 oz (62 kg)   LMP  (LMP Unknown)   SpO2 98%   BMI 28.06 kg/m   Wt Readings from Last 3 Encounters:  06/16/21 136 lb 9.6 oz (62 kg)  05/22/21 130 lb (59 kg)  03/07/21 131 lb 9.6 oz (59.7 kg)    Physical Exam  General appearance - alert, well appearing, and in no distress Mental status - normal mood, behavior, speech, dress, motor activity, and thought processes Neck - supple, no significant adenopathy Chest -breath sounds slightly decreased at the bases but clear otherwise. Heart - normal rate, regular rhythm, normal S1, S2, no murmurs, rubs, clicks or gallops Extremities - peripheral pulses normal, no pedal edema, no clubbing or cyanosis      Latest Ref Rng & Units 02/27/2021    2:39 PM 02/14/2021   12:18 PM 01/24/2021   10:51 AM  CMP  Glucose 70 - 99 mg/dL 938    182    BUN 6 - 23 mg/dL 19    17    Creatinine 0.40 - 1.20 mg/dL 9.93    7.16    Sodium 135 - 145 mEq/L 140    136    Potassium 3.5 - 5.1 mEq/L 3.5   4.4   3.4    Chloride 96 - 112 mEq/L 105    104    CO2 19 - 32 mEq/L 25    23    Calcium 8.4 - 10.5 mg/dL 9.1     9.9    Total Protein 6.0 - 8.3 g/dL 6.6    7.3    Total Bilirubin 0.2 - 1.2 mg/dL 0.3  0.2    Alkaline Phos 39 - 117 U/L 118    319    AST 0 - 37 U/L 22    36    ALT 0 - 35 U/L 24    62     Lipid Panel     Component Value Date/Time   CHOL 263 (H) 07/21/2020 0838   TRIG 234 (H) 07/21/2020 0838   HDL 72 07/21/2020 0838   CHOLHDL 3.7 07/21/2020 0838   LDLCALC 149 (H) 07/21/2020 0838    CBC    Component Value Date/Time   WBC 8.8 02/27/2021 1439   RBC 5.02 02/27/2021 1439   HGB 13.3 02/27/2021 1439   HGB 15.5 09/21/2019 1210   HCT 40.3 02/27/2021 1439   HCT 46.5 09/21/2019 1210   PLT 356.0 02/27/2021 1439   PLT 423 02/03/2019 0924   MCV 80.2 02/27/2021 1439   MCV 80 09/21/2019 1210   MCH 26.8 09/21/2019 1210   MCH 27.0 08/15/2017 0611   MCHC 33.0 02/27/2021 1439   RDW 16.0 (H) 02/27/2021 1439   RDW 17.2 (H) 09/21/2019 1210   LYMPHSABS 3.4 02/27/2021 1439   LYMPHSABS 5.0 (H) 09/21/2019 1210   MONOABS 0.9 02/27/2021 1439   EOSABS 0.0 02/27/2021 1439   EOSABS 0.0 09/21/2019 1210   BASOSABS 0.0 02/27/2021 1439   BASOSABS 0.1 09/21/2019 1210    ASSESSMENT AND PLAN: 1. Type 2 diabetes mellitus with peripheral neuropathy (HCC) Recommend that she restart the metformin.  Encourage healthy eating habits and try to move as much as she can.  We will recheck A1c today. - POCT glucose (manual entry) - Lipid panel - Hemoglobin A1c  2. Hypertension associated with diabetes (HCC) Not at goal but she has not taken Coreg and losartan as yet for today.  She will continue her 3 blood pressure medications. - amLODipine (NORVASC) 10 MG tablet; Take 1 tablet (10 mg total) by mouth daily.  Dispense: 30 tablet; Refill: 6  3. Hyperlipidemia associated with type 2 diabetes mellitus (HCC) Continue Crestor.  She is complaining of some muscle aches for the past 3 days.  We will check a CK level.  4. Aortic atherosclerosis (HCC) Patient on statin and beta-blocker.  5. Overweight (BMI  25.0-29.9) Discussed and encourage healthy eating habits.  I think her weight had increased because she was on prednisone.  However she has since been weaned off of it.  Encouraged her to move as much as she can.  6. Muscle ache - CK    [employer] Language interpreter used during this encounter. #[id number] Hesselbach, Maria  Patient was given the opportunity to ask questions.  Patient verbalized understanding of the plan and was able to repeat key elements of the plan.   This documentation was completed using Paediatric nurseDragon voice recognition technology.  Any transcriptional errors are unintentional.  Orders Placed This Encounter  Procedures   Lipid panel   CK   Hemoglobin A1c   POCT glucose (manual entry)     Requested Prescriptions   Signed Prescriptions Disp Refills   amLODipine (NORVASC) 10 MG tablet 30 tablet 6    Sig: Take 1 tablet (10 mg total) by mouth daily.    Return in about 4 months (around 10/17/2021).  Jonah Blueeborah Jonise Weightman, MD, FACP

## 2021-06-17 LAB — LIPID PANEL
Chol/HDL Ratio: 6.9 ratio — ABNORMAL HIGH (ref 0.0–4.4)
Cholesterol, Total: 262 mg/dL — ABNORMAL HIGH (ref 100–199)
HDL: 38 mg/dL — ABNORMAL LOW (ref >39)
LDL Chol Calc (NIH): 131 mg/dL — ABNORMAL HIGH (ref 0–99)
Triglycerides: 511 mg/dL — ABNORMAL HIGH (ref 0–149)
VLDL Cholesterol Cal: 93 mg/dL — ABNORMAL HIGH (ref 5–40)

## 2021-06-17 LAB — CK: Total CK: 91 U/L (ref 32–182)

## 2021-06-17 LAB — HEMOGLOBIN A1C
Est. average glucose Bld gHb Est-mCnc: 140 mg/dL
Hgb A1c MFr Bld: 6.5 % — ABNORMAL HIGH (ref 4.8–5.6)

## 2021-06-17 NOTE — Progress Notes (Signed)
Let patient know that all of her cholesterol levels are elevated suggesting that she was not taking the rosuvastatin which is her cholesterol medication consistently.  Please confirm whether she has been taking it every day consistently for the past 2 months.  If she has, we would need to increase the dose from 10 mg to 20 mg.  If she has not been taking it consistently, please encourage her to do so. A1c which is the 59-month level for diabetes is 6.5.  The goal is to be less than 7.  This means her diabetes is under good control.

## 2021-06-20 ENCOUNTER — Telehealth: Payer: Self-pay

## 2021-06-20 NOTE — Telephone Encounter (Signed)
[employer] Donald Pore Id# [id number]  contacted pt to go over lab results pt is aware   Pt states she hasn't been taking her cholesterol medication everyday. I have encouraged pt to take medication everyday. Pt states she will start taking it everyday.

## 2021-07-17 ENCOUNTER — Other Ambulatory Visit: Payer: Self-pay

## 2021-07-17 ENCOUNTER — Other Ambulatory Visit: Payer: Self-pay | Admitting: Cardiovascular Disease

## 2021-07-17 DIAGNOSIS — E785 Hyperlipidemia, unspecified: Secondary | ICD-10-CM

## 2021-08-11 ENCOUNTER — Other Ambulatory Visit: Payer: Self-pay

## 2021-08-11 ENCOUNTER — Ambulatory Visit (INDEPENDENT_AMBULATORY_CARE_PROVIDER_SITE_OTHER): Payer: No Typology Code available for payment source | Admitting: Internal Medicine

## 2021-08-11 ENCOUNTER — Encounter: Payer: Self-pay | Admitting: Internal Medicine

## 2021-08-11 VITALS — BP 110/68 | HR 68 | Ht <= 58 in | Wt 134.8 lb

## 2021-08-11 DIAGNOSIS — Z5181 Encounter for therapeutic drug level monitoring: Secondary | ICD-10-CM

## 2021-08-11 DIAGNOSIS — J679 Hypersensitivity pneumonitis due to unspecified organic dust: Secondary | ICD-10-CM

## 2021-08-11 MED ORDER — MYCOPHENOLATE MOFETIL 500 MG PO TABS
500.0000 mg | ORAL_TABLET | Freq: Two times a day (BID) | ORAL | 5 refills | Status: DC
  Filled 2021-08-11: qty 60, 30d supply, fill #0
  Filled 2021-10-13: qty 60, 30d supply, fill #1

## 2021-08-11 NOTE — Progress Notes (Signed)
Tammy Osborne    [id number]    1955/10/11  Primary Care Physician:Johnson, Binnie Rail, MD Date of Appointment: 08/11/2021 Established Patient Visit  Chief complaint:   Chief Complaint  Patient presents with   Follow-up    Pt f/u, seems to feel well no SOB, cough or wheeze. No other questions or concerns     HPI: Tammy Osborne is a 66 y.o. woman spanish speaking who presents for hypersensitivity pneumonitis.  Interpreter present. Intolerant of azathioprine due to hepatotoxicity.   Interval Updates: Tolerating cell cept 500 mg BID and off prednisone without issues.  Not taking symbicort.   Still working at [employer], no issues with breathing.  She is having some chronic back pain but otherwise feeling in good health.    Social History: [place] here around [place]. Never smoker, but did have indoor wood burning stove in childhood.  Has lived in the same house for 5 years which is when  her symptoms started. With her brother sister and a nephew. No one else has breathing problems at home. She has worked in the same job for the last 5 years. No pets at home. No chickens or birds. Works at [employer] in [place]. 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 No musical instruments. No visible mold. Does not use down feather pillows. Has mice in her home, but no bats.   I have reviewed the patient's family social and past medical history and updated as appropriate.   Past Medical History:  Diagnosis Date   Diabetes mellitus without complication (HCC)    Hypertension     Past Surgical History:  Procedure Laterality Date   ABDOMINAL HYSTERECTOMY      Family History  Problem Relation Age of Onset   Hypertension Mother     Social History   Occupational History   Not on file  Tobacco Use   Smoking status: Never    Passive exposure: Yes   Smokeless tobacco: Never   Tobacco comments:    spouse  smoked in home X6-7 years.   Vaping Use   Vaping Use: Never used  Substance and Sexual Activity   Alcohol use: No   Drug use: Not on file   Sexual activity: Not on file     Physical Exam: Blood pressure 110/68, pulse 68, height 4\' 10"  (1.473 m), weight 134 lb 12.8 oz (61.1 kg), SpO2 94 %.  Gen:      NAD Lungs:   ctab no wheezes or crackles CV:       RRR no mrg   Data Reviewed: Imaging: I have personally reviewed the CT scan from Sept 2020 which shows mosaic attenuation. The ct chest in 2018 has diffuse GGOs which have improved in 2020.   PFTs:      Latest Ref Rng & Units 03/23/2020   10:39 AM 06/18/2017   10:36 AM 11/22/2016   10:43 AM  PFT Results  FVC-Pre L 1.20  1.57  1.73   FVC-Predicted Pre % 47  58  63   FVC-Post L 1.23  1.58  1.76   FVC-Predicted Post % 48  58  64   Pre FEV1/FVC % % 99  91  92   Post FEV1/FCV % % 97  92  91   FEV1-Pre L 1.18  1.42  1.59   FEV1-Predicted Pre % 61  68  76   FEV1-Post L  1.19  1.46  1.60   DLCO uncorrected ml/min/mmHg  11.45  11.90   DLCO UNC% %  65  67   DLCO corrected ml/min/mmHg   11.75   DLCO COR %Predicted %   67   DLVA Predicted %  104  98   TLC L 2.51  2.99  2.81   TLC % Predicted % 60  69  65   RV % Predicted % 45  42  55    I have personally reviewed the patient's PFTs and they show moderate restriction to ventilation. This is worse when compared to PFTs in 2018-2019.   Labs: Labs reviewed - had autoimmune serologies negative in 2018. HP panel negative.  CMP and CBC reviewed  Immunization status: Immunization History  Administered Date(s) Administered   Influenza,inj,Quad PF,6+ Mos 10/19/2016, 09/19/2017, 11/05/2018, 11/17/2019, 10/13/2020   Moderna Sars-Covid-2 Vaccination 06/27/2019, 07/25/2019   PNEUMOCOCCAL CONJUGATE-20 07/06/2020   Pneumococcal Polysaccharide-23 10/19/2016   Tdap 12/24/2016    Assessment:  Subacute Hypersensitivity Pneumonitis, unknown etiology, controlled GERD, controlled Encounter  for high risk drug monitoring - cell cept.    Plan/Recommendations:  continue cell cept 500 mg BID. Doing well off prednisone Unfortuntaely our PFT machine is broken today - will have to reschedule this at next visit. Labs ordered for today - will order standing monthly CMP and CBC. Continue PPI for reflux.    Return to Care: Return in about 4 months (around 12/12/2021).   Durel Salts, MD Pulmonary and Critical Care Medicine [facility] Office:[phone]

## 2021-08-11 NOTE — Patient Instructions (Addendum)
Please schedule follow up scheduled with myself in 4 months.  If my schedule is not open yet, we will contact you with a reminder closer to that time. Please call [phone] if you haven't heard from [place] a month before.   Before your next visit I would like you to have: Breathing testing before next visit - rescheduled from today. In 4 months Blood work today.  Blood work done once a month at your local lab. They will fax me the results.   Programe un seguimiento programado conmigo mismo en 4 meses. Si mi horario an no est abierto, nos pondremos en contacto con usted con un recordatorio ms cercano a ese momento. Llame al [phone] si no ha tenido noticias nuestras un mes antes.  Antes de su prxima visita me gustara que tuviera: Prueba de respiracin antes de la prxima visita, reprogramada a partir de hoy. Trabajo de sangre hoy. Anlisis de sangre realizados una vez al mes en su laboratorio local. Me enviarn por fax los [place].  [facility] [address] [place], [place], [place] [address]

## 2021-08-12 LAB — COMPREHENSIVE METABOLIC PANEL
ALT: 23 IU/L (ref 0–32)
AST: 20 IU/L (ref 0–40)
Albumin/Globulin Ratio: 1.8 (ref 1.2–2.2)
Albumin: 4.3 g/dL (ref 3.9–4.9)
Alkaline Phosphatase: 140 IU/L — ABNORMAL HIGH (ref 44–121)
BUN/Creatinine Ratio: 18 (ref 12–28)
BUN: 12 mg/dL (ref 8–27)
Bilirubin Total: <0.2 mg/dL (ref 0.0–1.2)
CO2: 21 mmol/L (ref 20–29)
Calcium: 9.4 mg/dL (ref 8.7–10.3)
Chloride: 107 mmol/L — ABNORMAL HIGH (ref 96–106)
Creatinine, Ser: 0.65 mg/dL (ref 0.57–1.00)
Globulin, Total: 2.4 g/dL (ref 1.5–4.5)
Glucose: 94 mg/dL (ref 70–99)
Potassium: 4 mmol/L (ref 3.5–5.2)
Sodium: 141 mmol/L (ref 134–144)
Total Protein: 6.7 g/dL (ref 6.0–8.5)
eGFR: 98 mL/min/{1.73_m2} (ref >59)

## 2021-08-12 LAB — CBC WITH DIFFERENTIAL/PLATELET
Basophils Absolute: 0 10*3/uL (ref 0.0–0.2)
Basos: 1 %
EOS (ABSOLUTE): 0 10*3/uL (ref 0.0–0.4)
Eos: 0 %
Hematocrit: 40 % (ref 34.0–46.6)
Hemoglobin: 12.9 g/dL (ref 11.1–15.9)
Immature Grans (Abs): 0 10*3/uL (ref 0.0–0.1)
Immature Granulocytes: 0 %
Lymphocytes Absolute: 2.9 10*3/uL (ref 0.7–3.1)
Lymphs: 42 %
MCH: 26.2 pg — ABNORMAL LOW (ref 26.6–33.0)
MCHC: 32.3 g/dL (ref 31.5–35.7)
MCV: 81 fL (ref 79–97)
Monocytes Absolute: 0.5 10*3/uL (ref 0.1–0.9)
Monocytes: 7 %
Neutrophils Absolute: 3.5 10*3/uL (ref 1.4–7.0)
Neutrophils: 50 %
Platelets: 312 10*3/uL (ref 150–450)
RBC: 4.92 x10E6/uL (ref 3.77–5.28)
RDW: 14.5 % (ref 11.7–15.4)
WBC: 7 10*3/uL (ref 3.4–10.8)

## 2021-08-14 ENCOUNTER — Other Ambulatory Visit: Payer: Self-pay | Admitting: Internal Medicine

## 2021-08-14 ENCOUNTER — Other Ambulatory Visit: Payer: Self-pay

## 2021-08-14 DIAGNOSIS — I10 Essential (primary) hypertension: Secondary | ICD-10-CM

## 2021-08-15 ENCOUNTER — Other Ambulatory Visit: Payer: Self-pay

## 2021-08-15 MED ORDER — LOSARTAN POTASSIUM 25 MG PO TABS
ORAL_TABLET | Freq: Every day | ORAL | 1 refills | Status: DC
  Filled 2021-08-15: qty 30, 30d supply, fill #0

## 2021-08-15 NOTE — Telephone Encounter (Signed)
Requested Prescriptions  Pending Prescriptions Disp Refills  . losartan (COZAAR) 25 MG tablet 90 tablet 1    Sig: TAKE 1 TABLET (25 MG TOTAL) BY MOUTH DAILY.     Cardiovascular:  Angiotensin Receptor Blockers Passed - 08/14/2021  4:39 PM      Passed - Cr in normal range and within 180 days    Creatinine, Ser  Date Value Ref Range Status  08/11/2021 0.65 0.57 - 1.00 mg/dL Final         Passed - K in normal range and within 180 days    Potassium  Date Value Ref Range Status  08/11/2021 4.0 3.5 - 5.2 mmol/L Final         Passed - Patient is not pregnant      Passed - Last BP in normal range    BP Readings from Last 1 Encounters:  08/11/21 110/68         Passed - Valid encounter within last 6 months    Recent Outpatient Visits          2 months ago Type 2 diabetes mellitus with peripheral neuropathy (HCC)   [facility] Converse, Gavin Pound B, MD   6 months ago Controlled type 2 diabetes mellitus with diabetic polyneuropathy, without long-term current use of insulin (HCC)   [facility] Jonah Blue B, MD   10 months ago Controlled type 2 diabetes mellitus with diabetic polyneuropathy, without long-term current use of insulin (HCC)   [place] [facility] Jonah Blue B, MD   1 year ago Controlled type 2 diabetes mellitus with diabetic polyneuropathy, without long-term current use of insulin (HCC)    [facility] Hoy Register, MD   1 year ago Need for influenza vaccination   [facility] Lois Huxley, Cornelius Moras, RPH-CPP      Future Appointments            In 1 month Laural Benes, Binnie Rail, MD [facility]

## 2021-08-21 ENCOUNTER — Other Ambulatory Visit: Payer: Self-pay

## 2021-09-29 ENCOUNTER — Telehealth: Payer: Self-pay | Admitting: Internal Medicine

## 2021-09-29 ENCOUNTER — Other Ambulatory Visit: Payer: Self-pay | Admitting: Internal Medicine

## 2021-09-29 NOTE — Telephone Encounter (Signed)
Pt's lab orders have been faxed to [facility] fax number. Called and spoke with pt's friend Rutherford Nail letting her know this had been done and she verbalized understanding. Nothing further needed.

## 2021-09-30 LAB — CBC WITH DIFFERENTIAL/PLATELET
Basophils Absolute: 0 10*3/uL (ref 0.0–0.2)
Basos: 1 %
EOS (ABSOLUTE): 0 10*3/uL (ref 0.0–0.4)
Eos: 0 %
Hematocrit: 45.8 % (ref 34.0–46.6)
Hemoglobin: 15.1 g/dL (ref 11.1–15.9)
Immature Grans (Abs): 0 10*3/uL (ref 0.0–0.1)
Immature Granulocytes: 0 %
Lymphocytes Absolute: 3.7 10*3/uL — ABNORMAL HIGH (ref 0.7–3.1)
Lymphs: 43 %
MCH: 26.6 pg (ref 26.6–33.0)
MCHC: 33 g/dL (ref 31.5–35.7)
MCV: 81 fL (ref 79–97)
Monocytes Absolute: 0.7 10*3/uL (ref 0.1–0.9)
Monocytes: 8 %
Neutrophils Absolute: 4.3 10*3/uL (ref 1.4–7.0)
Neutrophils: 48 %
Platelets: 344 10*3/uL (ref 150–450)
RBC: 5.68 x10E6/uL — ABNORMAL HIGH (ref 3.77–5.28)
RDW: 16.3 % — ABNORMAL HIGH (ref 11.7–15.4)
WBC: 8.8 10*3/uL (ref 3.4–10.8)

## 2021-09-30 LAB — COMPREHENSIVE METABOLIC PANEL
ALT: 23 IU/L (ref 0–32)
AST: 21 IU/L (ref 0–40)
Albumin/Globulin Ratio: 2 (ref 1.2–2.2)
Albumin: 4.9 g/dL (ref 3.9–4.9)
Alkaline Phosphatase: 157 IU/L — ABNORMAL HIGH (ref 44–121)
BUN/Creatinine Ratio: 19 (ref 12–28)
BUN: 15 mg/dL (ref 8–27)
Bilirubin Total: 0.3 mg/dL (ref 0.0–1.2)
CO2: 20 mmol/L (ref 20–29)
Calcium: 10.1 mg/dL (ref 8.7–10.3)
Chloride: 102 mmol/L (ref 96–106)
Creatinine, Ser: 0.77 mg/dL (ref 0.57–1.00)
Globulin, Total: 2.4 g/dL (ref 1.5–4.5)
Glucose: 95 mg/dL (ref 70–99)
Potassium: 3.8 mmol/L (ref 3.5–5.2)
Sodium: 140 mmol/L (ref 134–144)
Total Protein: 7.3 g/dL (ref 6.0–8.5)
eGFR: 86 mL/min/{1.73_m2} (ref >59)

## 2021-10-13 ENCOUNTER — Encounter: Payer: Self-pay | Admitting: Internal Medicine

## 2021-10-13 ENCOUNTER — Other Ambulatory Visit: Payer: Self-pay | Admitting: Internal Medicine

## 2021-10-13 ENCOUNTER — Ambulatory Visit: Payer: Self-pay | Attending: Internal Medicine | Admitting: Internal Medicine

## 2021-10-13 ENCOUNTER — Other Ambulatory Visit: Payer: Self-pay

## 2021-10-13 VITALS — BP 170/80 | HR 76 | Temp 98.0°F | Ht <= 58 in | Wt 138.4 lb

## 2021-10-13 DIAGNOSIS — I152 Hypertension secondary to endocrine disorders: Secondary | ICD-10-CM

## 2021-10-13 DIAGNOSIS — E1169 Type 2 diabetes mellitus with other specified complication: Secondary | ICD-10-CM

## 2021-10-13 DIAGNOSIS — E1142 Type 2 diabetes mellitus with diabetic polyneuropathy: Secondary | ICD-10-CM

## 2021-10-13 DIAGNOSIS — E1159 Type 2 diabetes mellitus with other circulatory complications: Secondary | ICD-10-CM

## 2021-10-13 DIAGNOSIS — I10 Essential (primary) hypertension: Secondary | ICD-10-CM

## 2021-10-13 DIAGNOSIS — E785 Hyperlipidemia, unspecified: Secondary | ICD-10-CM

## 2021-10-13 DIAGNOSIS — Z23 Encounter for immunization: Secondary | ICD-10-CM

## 2021-10-13 DIAGNOSIS — R1030 Lower abdominal pain, unspecified: Secondary | ICD-10-CM

## 2021-10-13 LAB — POCT URINALYSIS DIP (CLINITEK)
Bilirubin, UA: NEGATIVE
Blood, UA: NEGATIVE
Glucose, UA: NEGATIVE mg/dL
Ketones, POC UA: NEGATIVE mg/dL
Nitrite, UA: NEGATIVE
POC PROTEIN,UA: NEGATIVE
Spec Grav, UA: 1.025 (ref 1.010–1.025)
Urobilinogen, UA: 0.2 E.U./dL
pH, UA: 6.5 (ref 5.0–8.0)

## 2021-10-13 LAB — POCT GLYCOSYLATED HEMOGLOBIN (HGB A1C): HbA1c, POC (controlled diabetic range): 6.3 % (ref 0.0–7.0)

## 2021-10-13 MED ORDER — CIPROFLOXACIN HCL 500 MG PO TABS
500.0000 mg | ORAL_TABLET | Freq: Two times a day (BID) | ORAL | 0 refills | Status: AC
  Filled 2021-10-13: qty 10, 5d supply, fill #0

## 2021-10-13 MED ORDER — LOSARTAN POTASSIUM 50 MG PO TABS
50.0000 mg | ORAL_TABLET | Freq: Every day | ORAL | 1 refills | Status: DC
  Filled 2021-10-13: qty 90, 90d supply, fill #0
  Filled 2022-03-22: qty 30, 30d supply, fill #1
  Filled 2022-05-01: qty 30, 30d supply, fill #2

## 2021-10-13 NOTE — Progress Notes (Signed)
Patient ID: Tammy Osborne, female    DOB: 1955/05/31  MRN: [id number]  CC: Diabetes   Subjective: Tammy Osborne is a 66 y.o. female who presents for chronic ds management Her concerns today include:  Patient with history of HTN,  DM 2, probable ILD, HL, anemia, GERD, aortic atherosclerosis  Has medications with her today  Pt c/o constant inflammation feeling across lower abdomen x 1 mth Associated with frequent urination and only a little comes out at a time.  No burning when the urine comes out.  No abnormal vaginal dischg or bleeding.  No N/V; moving bowels okay.    HTN: BP elevated today.   should be on Coreg 12.5 mg BID, Losartan 25 mg and Norvasc10 mg.  She confirms taking and took already for today Checks blood pressure about once a week.  Reports that some of her recent readings were 125/70, 135/80.    HL: does not have bottle of Crestor with her today.  Reports she is not taking because she feels she is on too much medications.  Reports she is trying to be careful with eating habits   DIABETES TYPE 2 Last A1C:   Results for orders placed or performed in visit on 10/13/21  POCT glycosylated hemoglobin (Hb A1C)  Result Value Ref Range   Hemoglobin A1C     HbA1c POC (<> result, manual entry)     HbA1c, POC (prediabetic range)     HbA1c, POC (controlled diabetic range) 6.3 0.0 - 7.0 %  POCT URINALYSIS DIP (CLINITEK)  Result Value Ref Range   Color, UA yellow yellow   Clarity, UA clear clear   Glucose, UA negative negative mg/dL   Bilirubin, UA negative negative   Ketones, POC UA negative negative mg/dL   Spec Grav, UA 1.601 0.932 - 1.025   Blood, UA negative negative   pH, UA 6.5 5.0 - 8.0   POC PROTEIN,UA negative negative, trace   Urobilinogen, UA 0.2 0.2 or 1.0 E.U./dL   Nitrite, UA Negative Negative   Leukocytes, UA Trace (A) Negative  This has a been diet control.  Feels she does well with eating habits.   Walks 30-40 mins 3-4 x/wk Over due  for eye exam.  Lacks finances.  HM:  due for flu vaccine today.  MMG ordered 03/2021.  Pt states she was called but she was not able to go that day because she had to go out of town; plans to call them back to reschedule.      Patient Active Problem List   Diagnosis Date Noted   Aortic atherosclerosis (HCC) 09/21/2019   Hyperlipidemia associated with type 2 diabetes mellitus (HCC) 09/21/2019   Gastroesophageal reflux disease without esophagitis 04/04/2018   Controlled type 2 diabetes mellitus with diabetic polyneuropathy, without long-term current use of insulin (HCC) 10/19/2016   Essential hypertension 09/06/2016   Interstitial pulmonary disease (HCC) 09/06/2016   Cough in adult 09/06/2016   Anemia 09/06/2016   Hyperlipidemia 09/06/2016   Numbness of lower limb 09/06/2016     Current Outpatient Medications on File Prior to Visit  Medication Sig Dispense Refill   amLODipine (NORVASC) 10 MG tablet Take 1 tablet (10 mg total) by mouth daily. 30 tablet 6   aspirin EC 81 MG tablet Take 1 tablet (81 mg total) by mouth daily. 100 tablet 1   carvedilol (COREG) 12.5 MG tablet TAKE 1 TABLET (12.5 MG TOTAL) BY MOUTH 2 (TWO) TIMES DAILY WITH A MEAL. 60  tablet 6   gabapentin (NEURONTIN) 100 MG capsule Take 2 caps by mouth twice daily 120 capsule 5   metFORMIN (GLUCOPHAGE) 500 MG tablet TAKE 1 TABLET (500 MG TOTAL) BY MOUTH DAILY WITH BREAKFAST. 90 tablet 3   mycophenolate (CELLCEPT) 500 MG tablet Take 1 tablet (500 mg total) by mouth 2 (two) times daily. 60 tablet 5   omeprazole (PRILOSEC) 20 MG capsule Take 1 capsule (20 mg total) by mouth 2 (two) times daily before a meal. 60 capsule 6   rosuvastatin (CRESTOR) 20 MG tablet Take 1 tablet (20 mg total) by mouth daily. 90 tablet 1   No current facility-administered medications on file prior to visit.    No Known Allergies  Social History   Socioeconomic History   Marital status: Single    Spouse name: Not on file   Number of children: 2    Years of education: Not on file   Highest education level: Not on file  Occupational History   Not on file  Tobacco Use   Smoking status: Never    Passive exposure: Yes   Smokeless tobacco: Never   Tobacco comments:    spouse smoked in home X6-7 years.   Vaping Use   Vaping Use: Never used  Substance and Sexual Activity   Alcohol use: No   Drug use: Not on file   Sexual activity: Not on file  Other Topics Concern   Not on file  Social History Narrative   Not on file   Social Determinants of Health   Financial Resource Strain: Not on file  Food Insecurity: Not on file  Transportation Needs: Not on file  Physical Activity: Not on file  Stress: Not on file  Social Connections: Not on file  Intimate Partner Violence: Not on file    Family History  Problem Relation Age of Onset   Hypertension Mother     Past Surgical History:  Procedure Laterality Date   ABDOMINAL HYSTERECTOMY      ROS: Review of Systems Negative except as stated above  PHYSICAL EXAM: BP (!) 170/80   Pulse 76   Temp 98 F (36.7 C) (Oral)   Ht 4\' 10"  (1.473 m)   Wt 138 lb 6.4 oz (62.8 kg)   LMP  (LMP Unknown)   SpO2 96%   BMI 28.93 kg/m   Physical Exam  General appearance - alert, well appearing, and in no distress Mental status - normal mood, behavior, speech, dress, motor activity, and thought processes Neck - supple, no significant adenopathy Chest -good air entry.  Sounds decreased at the bases Heart - normal rate, regular rhythm, normal S1, S2, no murmurs, rubs, clicks or gallops Abdomen: Normal bowel sounds, soft, nontender, no organomegaly Extremities - peripheral pulses normal, no pedal edema, no clubbing or cyanosis      Latest Ref Rng & Units 09/29/2021   11:35 AM 08/11/2021   11:14 AM 02/27/2021    2:39 PM  CMP  Glucose 70 - 99 mg/dL 95  94  03/01/2021   BUN 8 - 27 mg/dL 15  12  19    Creatinine 0.57 - 1.00 mg/dL 834   1.96   Sodium 134 - 144 mmol/L 140  141  140    Potassium 3.5 - 5.2 mmol/L 3.8  4.0  3.5   Chloride 96 - 106 mmol/L 102  107  105   CO2 20 - 29 mmol/L 20  21  25    Calcium 8.7 - 10.3 mg/dL 10.1  9.4  9.1   Total Protein 6.0 - 8.5 g/dL 7.3  6.7  6.6   Total Bilirubin 0.0 - 1.2 mg/dL 0.3  <2.9  0.3   Alkaline Phos 44 - 121 IU/L 157  140  118   AST 0 - 40 IU/L 21  20  22    ALT 0 - 32 IU/L 23  23  24     Lipid Panel     Component Value Date/Time   CHOL 262 (H) 06/16/2021 1209   TRIG 511 (H) 06/16/2021 1209   HDL 38 (L) 06/16/2021 1209   CHOLHDL 6.9 (H) 06/16/2021 1209   LDLCALC 131 (H) 06/16/2021 1209    CBC    Component Value Date/Time   WBC 8.8 09/29/2021 1135   WBC 8.8 02/27/2021 1439   RBC 5.68 (H) 09/29/2021 1135   RBC 5.02 02/27/2021 1439   HGB 15.1 09/29/2021 1135   HCT 45.8 09/29/2021 1135   PLT 344 09/29/2021 1135   MCV 81 09/29/2021 1135   MCH 26.6 09/29/2021 1135   MCH 27.0 08/15/2017 0611   MCHC 33.0 09/29/2021 1135   MCHC 33.0 02/27/2021 1439   RDW 16.3 (H) 09/29/2021 1135   LYMPHSABS 3.7 (H) 09/29/2021 1135   MONOABS 0.9 02/27/2021 1439   EOSABS 0.0 09/29/2021 1135   BASOSABS 0.0 09/29/2021 1135   Results for orders placed or performed in visit on 10/13/21  POCT glycosylated hemoglobin (Hb A1C)  Result Value Ref Range   Hemoglobin A1C     HbA1c POC (<> result, manual entry)     HbA1c, POC (prediabetic range)     HbA1c, POC (controlled diabetic range) 6.3 0.0 - 7.0 %  POCT URINALYSIS DIP (CLINITEK)  Result Value Ref Range   Color, UA yellow yellow   Clarity, UA clear clear   Glucose, UA negative negative mg/dL   Bilirubin, UA negative negative   Ketones, POC UA negative negative mg/dL   Spec Grav, UA 11/29/2021 10/15/21 - 1.025   Blood, UA negative negative   pH, UA 6.5 5.0 - 8.0   POC PROTEIN,UA negative negative, trace   Urobilinogen, UA 0.2 0.2 or 1.0 E.U./dL   Nitrite, UA Negative Negative   Leukocytes, UA Trace (A) Negative    ASSESSMENT AND PLAN: 1. Type 2 diabetes mellitus with peripheral  neuropathy (HCC) At goal.  Diet control.  Continue healthy eating habits and regular exercise. - POCT glycosylated hemoglobin (Hb A1C) - POCT URINALYSIS DIP (CLINITEK) - Microalbumin / creatinine urine ratio  2. Hyperlipidemia associated with type 2 diabetes mellitus (HCC) Not at goal.  Encourage patient to take the Crestor as prescribed to decrease cardiovascular risk.  3. Hypertension associated with diabetes (HCC) Not at goal. Increase Cozaar to 50 mg daily.  Continue to monitor blood pressure with goal being 130/80 or lower. - losartan (COZAAR) 50 MG tablet; Take 1 tablet (50 mg total) by mouth daily.  Dispense: 90 tablet; Refill: 1  4. Lower abdominal pain UA looks okay.  However given her symptoms, we will try her with Cipro for 5 days. - ciprofloxacin (CIPRO) 500 MG tablet; Take 1 tablet (500 mg total) by mouth 2 (two) times daily for 5 days.  Dispense: 10 tablet; Refill: 0  5. Need for immunization against influenza - Flu Vaccine QUAD 43mo+IM (Fluarix, Fluzone & Alfiuria Quad PF)    [employer] Language interpreter used during this encounter. 6.967, Massimo  Patient was given the opportunity to ask questions.  Patient verbalized understanding of the plan and was  able to repeat key elements of the plan.   This documentation was completed using Paediatric nurse.  Any transcriptional errors are unintentional.  Orders Placed This Encounter  Procedures   Flu Vaccine QUAD 66mo+IM (Fluarix, Fluzone & Alfiuria Quad PF)   Microalbumin / creatinine urine ratio   POCT glycosylated hemoglobin (Hb A1C)   POCT URINALYSIS DIP (CLINITEK)     Requested Prescriptions   Signed Prescriptions Disp Refills   losartan (COZAAR) 50 MG tablet 90 tablet 1    Sig: Take 1 tablet (50 mg total) by mouth daily.   ciprofloxacin (CIPRO) 500 MG tablet 10 tablet 0    Sig: Take 1 tablet (500 mg total) by mouth 2 (two) times daily for 5 days.    Return in about 4 months (around  02/12/2022).  Jonah Blue, MD, FACP

## 2021-10-14 LAB — MICROALBUMIN / CREATININE URINE RATIO
Creatinine, Urine: 74.7 mg/dL
Microalb/Creat Ratio: 36 mg/g creat — ABNORMAL HIGH (ref 0–29)
Microalbumin, Urine: 27.2 ug/mL

## 2021-10-16 ENCOUNTER — Other Ambulatory Visit: Payer: Self-pay

## 2021-10-16 IMAGING — CT CT CHEST HIGH RESOLUTION W/O CM
2 of 7 series · 14 of 36 positions shown, 17 images · non-contrast
Comparison: 10/10/2018 high-resolution chest CT.

CLINICAL DATA: Chest pain and dyspnea. Concern for hypersensitivity
pneumonitis.

EXAM:
CT CHEST WITHOUT CONTRAST
TECHNIQUE: Multidetector CT imaging of the chest was performed following the
standard protocol without intravenous contrast. High resolution
imaging of the lungs, as well as inspiratory and expiratory imaging,
was performed.

[Series 4: high resolution retro · axial · 0.62mm/px · z∈[-143,+62]mm · 11 of 247 slices shown, 14 images]
[im 21/247  mediastinal]
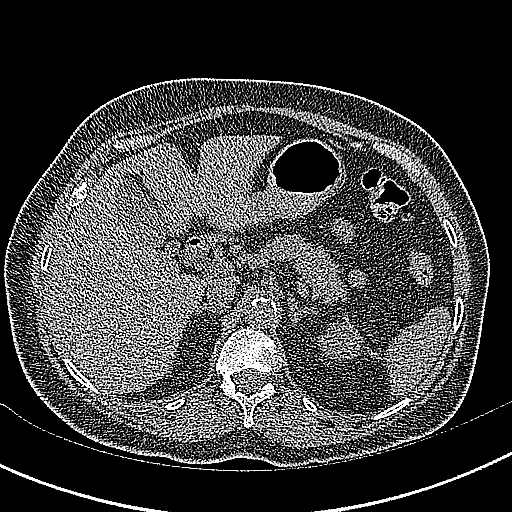
[im 21/247  lung]
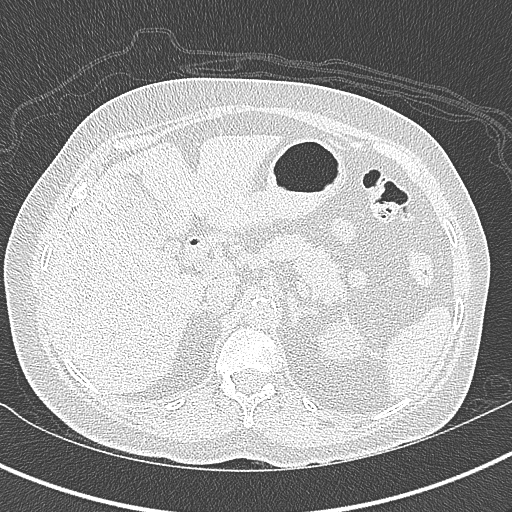
[im 42/247  lung]
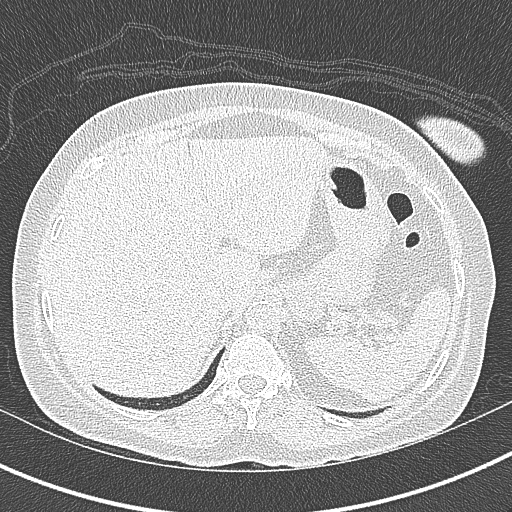
[im 62/247  lung]
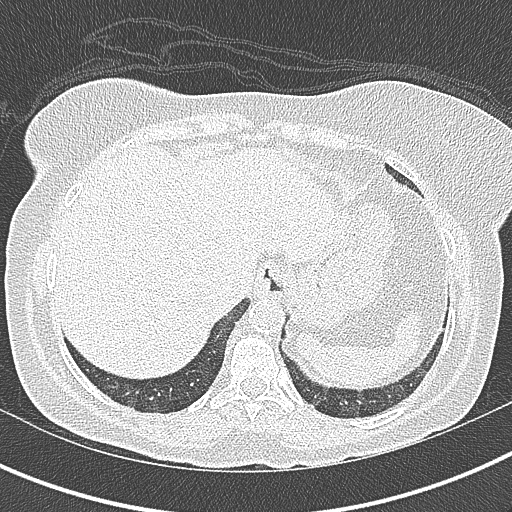
[im 83/247  lung]
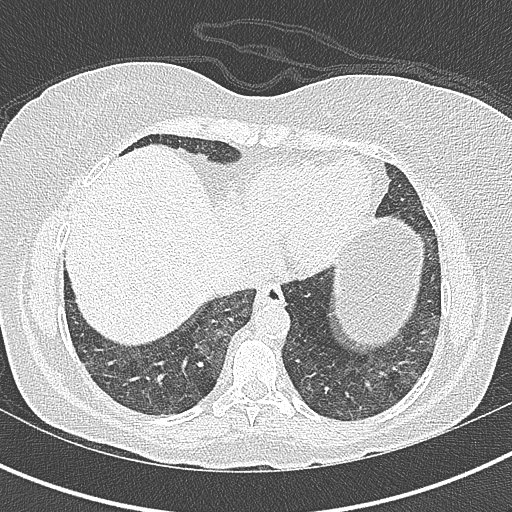
[im 103/247  mediastinal]
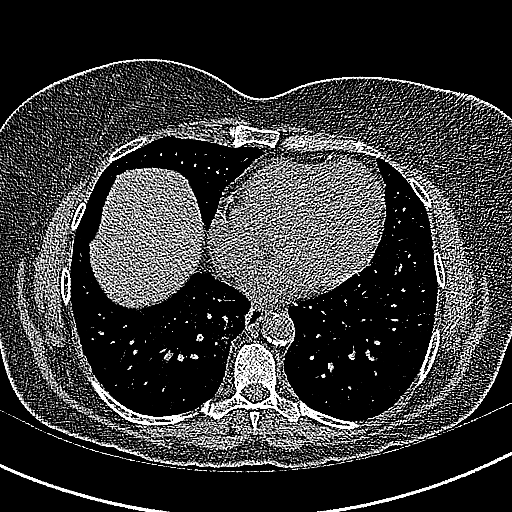
[im 103/247  lung]
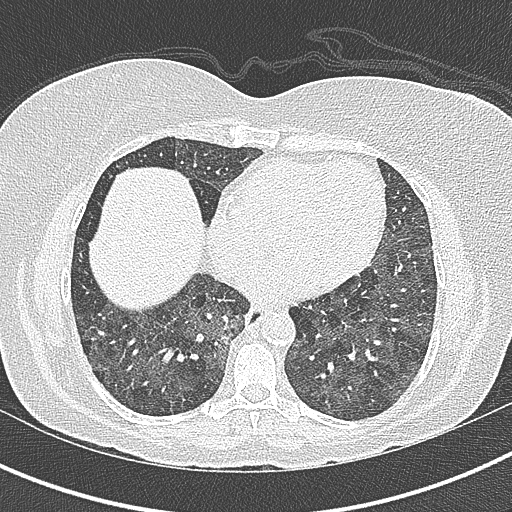
[im 124/247  lung]
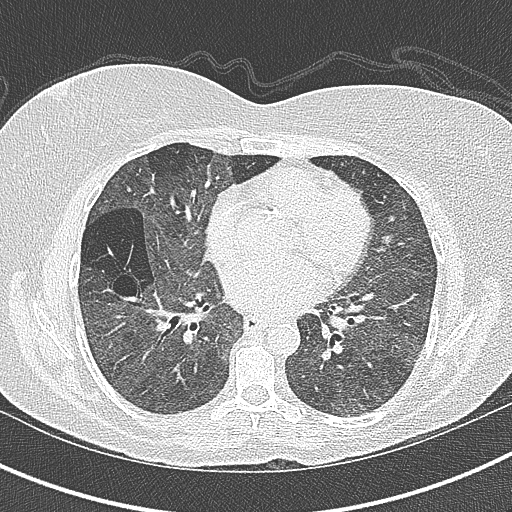
[im 144/247  lung]
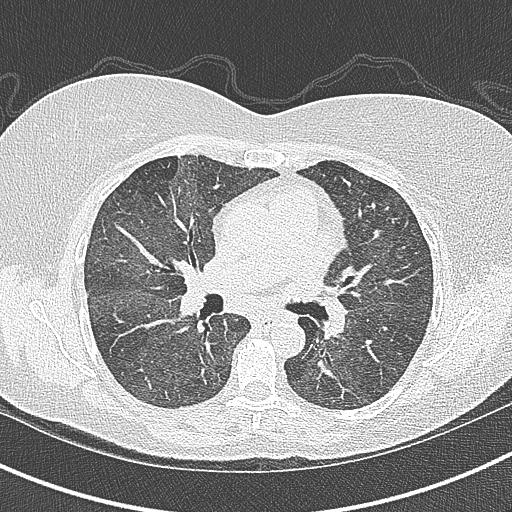
[im 165/247  lung]
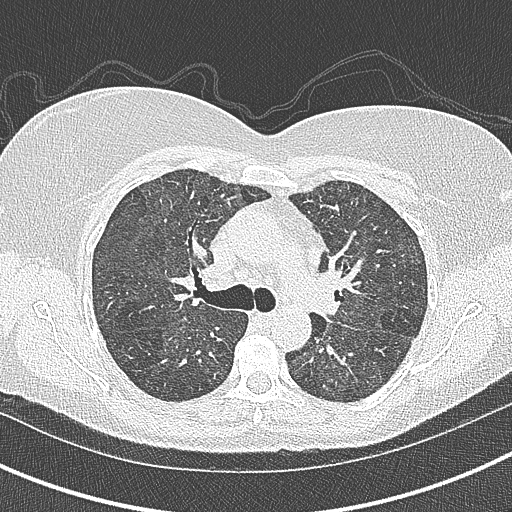
[im 185/247  mediastinal]
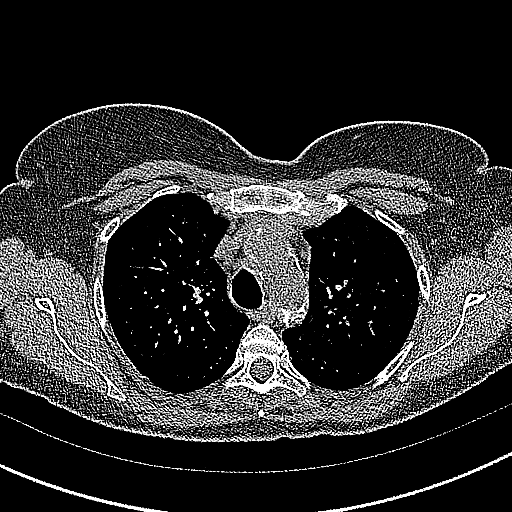
[im 185/247  lung]
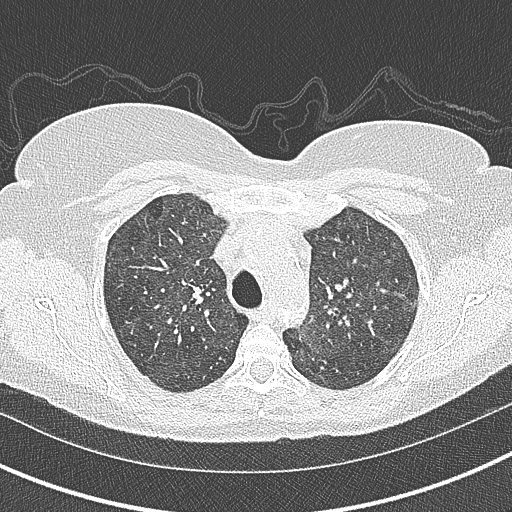
[im 206/247  lung]
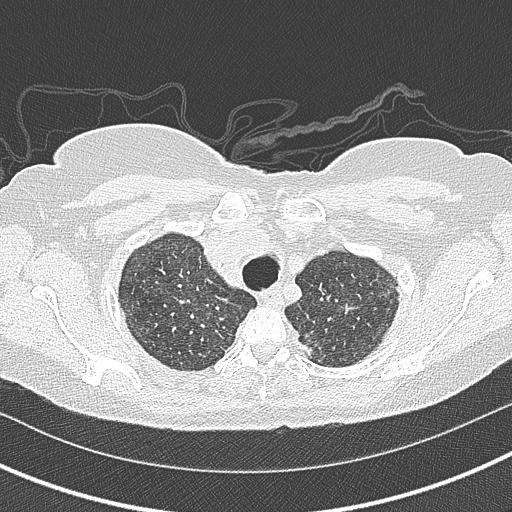
[im 226/247  lung]
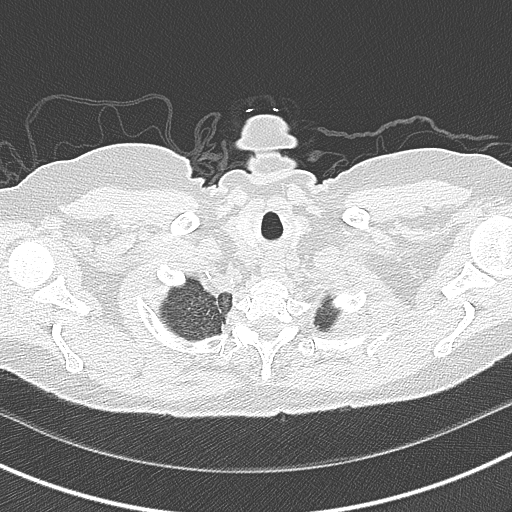

[Series 7: coronal · coronal · 0.56mm/px · 3 of 84 slices shown]
[im 17/84  lung]
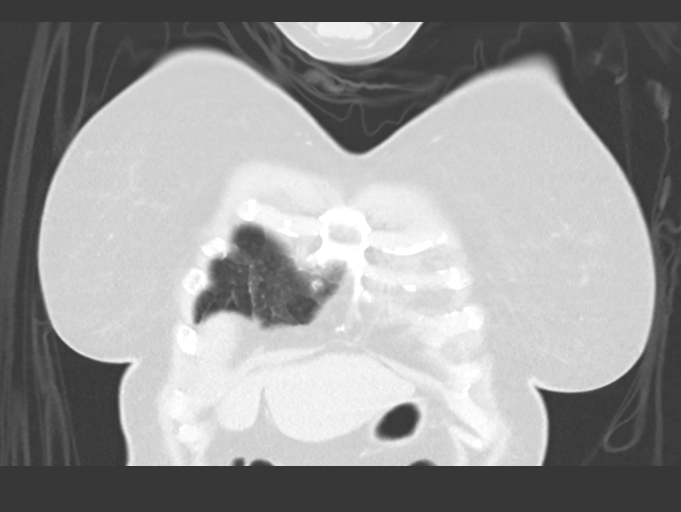
[im 34/84  lung]
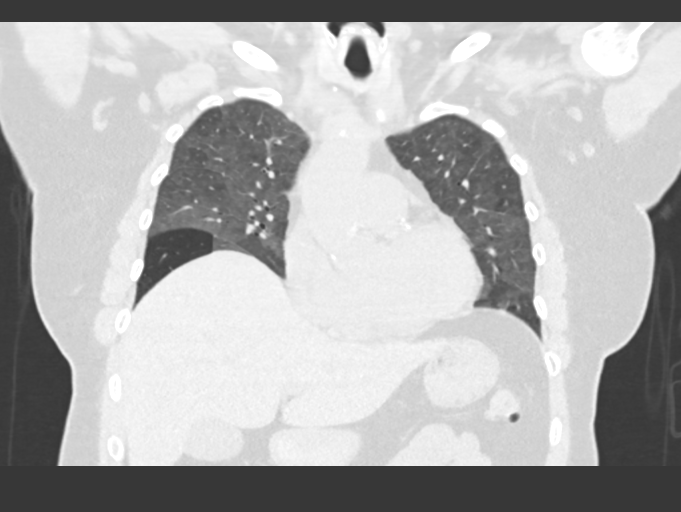
[im 50/84  lung]
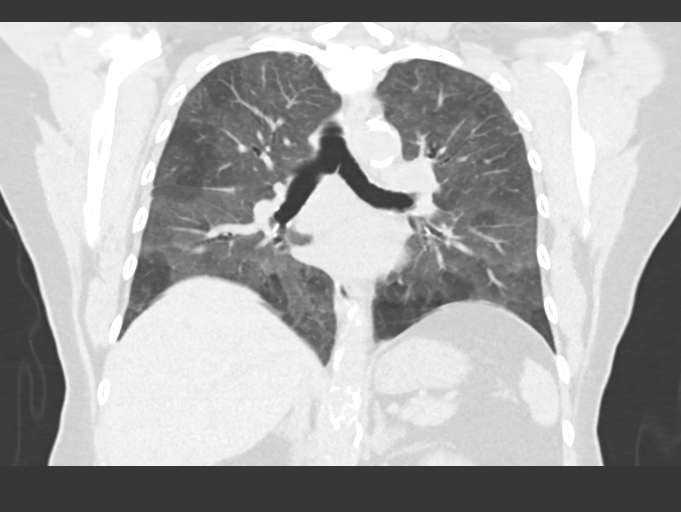

[14 of 36 positions shown; findings below may reference images not displayed]

FINDINGS: Cardiovascular: Normal heart size. No significant pericardial
effusion/thickening. Three-vessel coronary atherosclerosis.
Atherosclerotic nonaneurysmal thoracic aorta. Normal caliber
pulmonary arteries.

Mediastinum/Nodes: No discrete thyroid nodules. Unremarkable
esophagus. No axillary adenopathy. Newly mildly enlarged 1.1 cm
subcarinal node (series 2/image 53). No discrete hilar adenopathy on
these noncontrast images.

Lungs/Pleura: No pneumothorax. No pleural effusion. No acute
consolidative airspace disease, lung masses or significant pulmonary
nodules. Diffuse patchy ground-glass opacity throughout both lungs
involving all lung lobes with relative sparing of the lung bases,
worsened since 10/10/2018 chest CT. No significant regions of
subpleural reticulation, traction bronchiectasis, architectural
distortion or frank honeycombing. Prominent patchy air trapping in
both lungs on the expiration sequence, without evidence of
tracheobronchomalacia, unchanged.

Upper abdomen: Small hiatal hernia.

Musculoskeletal: No aggressive appearing focal osseous lesions.
Moderate thoracic spondylosis.
IMPRESSION: 1. Chronic diffuse patchy ground-glass opacity in both lungs with
relative sparing of the lung bases, worsened since 10/10/2018 chest
CT. Prominent patchy air trapping, indicative of small airways
disease. Findings are compatible with chronic hypersensitivity
pneumonitis, as previously described on imaging studies and as
clinically suspected. Findings are suggestive of an alternative
diagnosis (not UIP) per consensus guidelines: Diagnosis of
Idiopathic Pulmonary Fibrosis: An Official ATS/ERS/JRS/ALAT Clinical
Practice Guideline. Am J Respir Crit Care Med Vol 198, [place] 5,
ppe33-e[DATE].
2. New mild subcarinal lymphadenopathy, nonspecific, possibly
reactive.
3. Three-vessel coronary atherosclerosis.
4. Small hiatal hernia.
5. Aortic Atherosclerosis (XLYL1-012.2).

## 2021-11-07 ENCOUNTER — Other Ambulatory Visit: Payer: Self-pay | Admitting: Internal Medicine

## 2021-11-08 LAB — CBC WITH DIFFERENTIAL/PLATELET
Basophils Absolute: 0 10*3/uL (ref 0.0–0.2)
Basos: 0 %
EOS (ABSOLUTE): 0.1 10*3/uL (ref 0.0–0.4)
Eos: 1 %
Hematocrit: 47.1 % — ABNORMAL HIGH (ref 34.0–46.6)
Hemoglobin: 15.4 g/dL (ref 11.1–15.9)
Immature Grans (Abs): 0 10*3/uL (ref 0.0–0.1)
Immature Granulocytes: 0 %
Lymphocytes Absolute: 3.2 10*3/uL — ABNORMAL HIGH (ref 0.7–3.1)
Lymphs: 44 %
MCH: 26.6 pg (ref 26.6–33.0)
MCHC: 32.7 g/dL (ref 31.5–35.7)
MCV: 81 fL (ref 79–97)
Monocytes Absolute: 0.5 10*3/uL (ref 0.1–0.9)
Monocytes: 7 %
Neutrophils Absolute: 3.4 10*3/uL (ref 1.4–7.0)
Neutrophils: 48 %
Platelets: 376 10*3/uL (ref 150–450)
RBC: 5.8 x10E6/uL — ABNORMAL HIGH (ref 3.77–5.28)
RDW: 15.2 % (ref 11.7–15.4)
WBC: 7.2 10*3/uL (ref 3.4–10.8)

## 2021-11-08 LAB — COMPREHENSIVE METABOLIC PANEL
ALT: 23 IU/L (ref 0–32)
AST: 20 IU/L (ref 0–40)
Albumin/Globulin Ratio: 1.8 (ref 1.2–2.2)
Albumin: 4.8 g/dL (ref 3.9–4.9)
Alkaline Phosphatase: 156 IU/L — ABNORMAL HIGH (ref 44–121)
BUN/Creatinine Ratio: 19 (ref 12–28)
BUN: 13 mg/dL (ref 8–27)
Bilirubin Total: 0.2 mg/dL (ref 0.0–1.2)
CO2: 22 mmol/L (ref 20–29)
Calcium: 9.9 mg/dL (ref 8.7–10.3)
Chloride: 101 mmol/L (ref 96–106)
Creatinine, Ser: 0.68 mg/dL (ref 0.57–1.00)
Globulin, Total: 2.6 g/dL (ref 1.5–4.5)
Glucose: 103 mg/dL — ABNORMAL HIGH (ref 70–99)
Potassium: 4.1 mmol/L (ref 3.5–5.2)
Sodium: 139 mmol/L (ref 134–144)
Total Protein: 7.4 g/dL (ref 6.0–8.5)
eGFR: 97 mL/min/{1.73_m2} (ref >59)

## 2021-11-21 ENCOUNTER — Other Ambulatory Visit: Payer: Self-pay

## 2021-12-18 ENCOUNTER — Ambulatory Visit (INDEPENDENT_AMBULATORY_CARE_PROVIDER_SITE_OTHER): Payer: No Typology Code available for payment source | Admitting: Internal Medicine

## 2021-12-18 ENCOUNTER — Encounter: Payer: Self-pay | Admitting: Internal Medicine

## 2021-12-18 ENCOUNTER — Other Ambulatory Visit: Payer: Self-pay

## 2021-12-18 VITALS — BP 130/80 | HR 69 | Temp 97.6°F | Ht <= 58 in | Wt 134.0 lb

## 2021-12-18 DIAGNOSIS — J679 Hypersensitivity pneumonitis due to unspecified organic dust: Secondary | ICD-10-CM

## 2021-12-18 LAB — PULMONARY FUNCTION TEST
DL/VA % pred: 87 %
DL/VA: 3.82 ml/min/mmHg/L
DLCO cor % pred: 71 %
DLCO cor: 11.44 ml/min/mmHg
DLCO unc % pred: 75 %
DLCO unc: 12.09 ml/min/mmHg
FEF 25-75 Post: 3.25 L/sec
FEF 25-75 Pre: 3.22 L/sec
FEF2575-%Change-Post: 0 %
FEF2575-%Pred-Post: 180 %
FEF2575-%Pred-Pre: 178 %
FEV1-%Change-Post: 0 %
FEV1-%Pred-Post: 85 %
FEV1-%Pred-Pre: 85 %
FEV1-Post: 1.59 L
FEV1-Pre: 1.6 L
FEV1FVC-%Change-Post: -1 %
FEV1FVC-%Pred-Pre: 121 %
FEV6-%Change-Post: 0 %
FEV6-%Pred-Post: 73 %
FEV6-%Pred-Pre: 72 %
FEV6-Post: 1.73 L
FEV6-Pre: 1.71 L
FEV6FVC-%Pred-Post: 104 %
FEV6FVC-%Pred-Pre: 104 %
FVC-%Change-Post: 0 %
FVC-%Pred-Post: 70 %
FVC-%Pred-Pre: 69 %
FVC-Post: 1.73 L
FVC-Pre: 1.71 L
Post FEV1/FVC ratio: 92 %
Post FEV6/FVC ratio: 100 %
Pre FEV1/FVC ratio: 93 %
Pre FEV6/FVC Ratio: 100 %
RV % pred: 57 %
RV: 1.02 L
TLC % pred: 87 %
TLC: 3.62 L

## 2021-12-18 MED ORDER — MYCOPHENOLATE MOFETIL 500 MG PO TABS
500.0000 mg | ORAL_TABLET | Freq: Two times a day (BID) | ORAL | 11 refills | Status: DC
  Filled 2021-12-18 – 2022-01-15 (×2): qty 60, 30d supply, fill #0
  Filled 2022-03-22: qty 60, 30d supply, fill #1
  Filled 2022-05-01: qty 60, 30d supply, fill #2
  Filled 2022-06-18: qty 60, 30d supply, fill #3
  Filled 2022-08-06: qty 60, 30d supply, fill #4
  Filled 2022-10-19: qty 60, 30d supply, fill #5
  Filled 2022-12-11: qty 60, 30d supply, fill #6

## 2021-12-18 NOTE — Patient Instructions (Addendum)
Please schedule follow up scheduled with myself in 6 months.  If my schedule is not open yet, we will contact you with a reminder closer to that time. Please call [phone] if you haven't heard from [place] a month before.   Before your next visit I would like you to have:  Please get your blood work drawn every 8 weeks - next in end of December.  Continue cell cept 500 mg BID every day. Your lung function is stable to improved! We will continue the cell cept for now, at least the next year.  We will repeat PFT in a year. If lungs are stable we can discuss stopping the medication.    Por favor programe un seguimiento programado conmigo mismo en 6 meses. Si mi horario an no est abierto, nos comunicaremos con usted con un recordatorio ms cerca de esa hora. Llame al [phone] si no ha tenido noticias nuestras un mes antes.  Antes de tu prxima visita me gustara que tuvieras: Hgase un anlisis de sangre cada 8 semanas; el prximo, a finales de diciembre. Contine con [employer] manager, excepto 500 mg dos veces al [employer] Su funcin pulmonar es estable o mejorada! Continuaremos con el concepto celular por ahora, al menos el prximo ao. Repetiremos PFT en un ao. Si los pulmones estn estables, podemos discutir la suspensin del medicamento.

## 2021-12-18 NOTE — Progress Notes (Signed)
Tammy Osborne    ER:[id number]    [birth year]  Primary Care Physician:Johnson, Dalbert Batman, MD Date of Appointment: 12/18/2021 Established Patient Visit  Chief complaint:   Chief Complaint  Patient presents with   Follow-up    Pft ,      HPI: Tammy Osborne is a 66 y.o. woman spanish speaking who presents for hypersensitivity pneumonitis.  Interpreter present. Intolerant of azathioprine due to hepatotoxicity.   Interval Updates:  Here for follow up with interpreter.   Tolerating cell cept 500 mg BID and off prednisone without issues. Here for follow up after PFTs which show stable to improved FVC and DLCO since starting prednisone and cell cept.   Still working at [employer]. No issues or concerns with breathing.    Social History: Originally from [place], moved here around 2007. Never smoker, but did have indoor wood burning stove in childhood.  Has lived in the same house for 5 years which is when  her symptoms started. With her brother sister and a nephew. No one else has breathing problems at home. She has worked in the same job for the last 5 years. No pets at home. No chickens or birds. Works at [employer] in [place]. 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 No musical instruments. No visible mold. Does not use down feather pillows. Has mice in her home, but no bats.   I have reviewed the patient's family social and past medical history and updated as appropriate.   Past Medical History:  Diagnosis Date   Diabetes mellitus without complication ([place])    Hypertension     Past Surgical History:  Procedure Laterality Date   ABDOMINAL HYSTERECTOMY      Family History  Problem Relation Age of Onset   Hypertension Mother     Social History   Occupational History   Not on file  Tobacco Use   Smoking status: Never    Passive exposure: Yes   Smokeless tobacco: Never   Tobacco comments:    spouse smoked in  home X6-7 years.   Vaping Use   Vaping Use: Never used  Substance and Sexual Activity   Alcohol use: No   Drug use: Not on file   Sexual activity: Not on file     Physical Exam: Blood pressure 130/80, pulse 69, temperature 97.6 F (36.4 C), temperature source Oral, height 4\' 10"  (1.473 m), weight 134 lb (60.8 kg), SpO2 97 %.  Gen:      NAD Lungs:   ctab no wheezes or crackles CV:       RRR no mrg   Data Reviewed: Imaging: I have personally reviewed the CT scan from Sept 2020 which shows mosaic attenuation. The ct chest in 2018 has diffuse GGOs which have improved in 2020.   PFTs:      Latest Ref Rng & Units 12/18/2021   11:42 AM 03/23/2020   10:39 AM 06/18/2017   10:36 AM 11/22/2016   10:43 AM  PFT Results  FVC-Pre L 1.71  P 1.20  1.57  1.73   FVC-Predicted Pre % 69  P 47  58  63   FVC-Post L 1.73  P 1.23  1.58  1.76   FVC-Predicted Post % 70  P 48  58  64   Pre FEV1/FVC % % 93  P 99  91  92   Post FEV1/FCV % % 92  P 97  92  91   FEV1-Pre L 1.60  P 1.18  1.42  1.59   FEV1-Predicted Pre % 85  P 61  68  76   FEV1-Post L 1.59  P 1.19  1.46  1.60   DLCO uncorrected ml/min/mmHg 12.09  P  11.45  11.90   DLCO UNC% % 75  P  65  67   DLCO corrected ml/min/mmHg 11.44  P   11.75   DLCO COR %Predicted % 71  P   67   DLVA Predicted % 87  P  104  98   TLC L 3.62  P 2.51  2.99  2.81   TLC % Predicted % 87  P 60  69  65   RV % Predicted % 57  P 45  42  55     P Preliminary result   I have personally reviewed the patient's PFTs and they show moderate restriction to ventilation. FVC Nov 2023 shows stable lung function (improved actually) to 1 and 4 years prior. DLCO stable.   Labs: Labs reviewed - had autoimmune serologies negative in 2018. HP panel negative.  CMP and CBC reviewed  Immunization status: Immunization History  Administered Date(s) Administered   Influenza,inj,Quad PF,6+ Mos 10/19/2016, 09/19/2017, 11/05/2018, 11/17/2019, 10/13/2020, 10/13/2021   Moderna  Sars-Covid-2 Vaccination 06/27/2019, 07/25/2019   PNEUMOCOCCAL CONJUGATE-20 07/06/2020   Pneumococcal Polysaccharide-23 10/19/2016   Tdap 12/24/2016    Assessment:  Subacute Hypersensitivity Pneumonitis, unknown etiology, stable/improved PFT GERD, controlled Encounter for high risk drug monitoring - cell cept.    Plan/Recommendations:   Please get your blood work drawn every 8 weeks - next in end of December. We will switch to only q8 week CBC.  Continue cell cept 500 mg BID every day. Your lung function is stable to improved! We will continue the cell cept for now, at least the next year.  We will repeat PFT in a year. If lungs are stable we can discuss stopping the medication.    Return to Care: Return in about 6 months (around 06/18/2022).   Durel Salts, MD Pulmonary and Critical Care Medicine [facility] Office:[phone]

## 2021-12-18 NOTE — Patient Instructions (Signed)
Full PFT Performed Today  

## 2021-12-18 NOTE — Progress Notes (Signed)
Full PFT Performed Today  

## 2021-12-22 ENCOUNTER — Other Ambulatory Visit: Payer: Self-pay

## 2022-01-04 ENCOUNTER — Other Ambulatory Visit: Payer: Self-pay

## 2022-01-04 ENCOUNTER — Ambulatory Visit: Payer: Self-pay | Attending: Internal Medicine

## 2022-01-15 ENCOUNTER — Other Ambulatory Visit: Payer: Self-pay

## 2022-01-24 NOTE — Progress Notes (Unsigned)
Pt denied for [facility] Card due to over income (FPL 225%). Mailed denial letter to pt along w/ DHHS app for health coverage and [employer] resources for health coverage.

## 2022-02-08 ENCOUNTER — Other Ambulatory Visit: Payer: Self-pay

## 2022-02-13 ENCOUNTER — Other Ambulatory Visit: Payer: Self-pay

## 2022-02-13 ENCOUNTER — Ambulatory Visit: Payer: Self-pay | Attending: Internal Medicine | Admitting: Internal Medicine

## 2022-02-13 ENCOUNTER — Encounter: Payer: Self-pay | Admitting: Internal Medicine

## 2022-02-13 VITALS — BP 143/62 | HR 65 | Temp 97.8°F | Ht <= 58 in | Wt 134.0 lb

## 2022-02-13 DIAGNOSIS — E1142 Type 2 diabetes mellitus with diabetic polyneuropathy: Secondary | ICD-10-CM

## 2022-02-13 DIAGNOSIS — I700 Atherosclerosis of aorta: Secondary | ICD-10-CM

## 2022-02-13 DIAGNOSIS — Z1231 Encounter for screening mammogram for malignant neoplasm of breast: Secondary | ICD-10-CM

## 2022-02-13 DIAGNOSIS — Z1211 Encounter for screening for malignant neoplasm of colon: Secondary | ICD-10-CM

## 2022-02-13 DIAGNOSIS — Z23 Encounter for immunization: Secondary | ICD-10-CM

## 2022-02-13 DIAGNOSIS — H8110 Benign paroxysmal vertigo, unspecified ear: Secondary | ICD-10-CM

## 2022-02-13 DIAGNOSIS — E785 Hyperlipidemia, unspecified: Secondary | ICD-10-CM

## 2022-02-13 DIAGNOSIS — E1159 Type 2 diabetes mellitus with other circulatory complications: Secondary | ICD-10-CM

## 2022-02-13 DIAGNOSIS — L309 Dermatitis, unspecified: Secondary | ICD-10-CM

## 2022-02-13 DIAGNOSIS — J679 Hypersensitivity pneumonitis due to unspecified organic dust: Secondary | ICD-10-CM

## 2022-02-13 DIAGNOSIS — E1169 Type 2 diabetes mellitus with other specified complication: Secondary | ICD-10-CM

## 2022-02-13 DIAGNOSIS — I152 Hypertension secondary to endocrine disorders: Secondary | ICD-10-CM

## 2022-02-13 LAB — POCT GLYCOSYLATED HEMOGLOBIN (HGB A1C): HbA1c, POC (controlled diabetic range): 6.1 % (ref 0.0–7.0)

## 2022-02-13 LAB — GLUCOSE, POCT (MANUAL RESULT ENTRY): POC Glucose: 92 mg/dl (ref 70–99)

## 2022-02-13 MED ORDER — ROSUVASTATIN CALCIUM 5 MG PO TABS
5.0000 mg | ORAL_TABLET | Freq: Every day | ORAL | 1 refills | Status: DC
  Filled 2022-02-13: qty 90, 90d supply, fill #0

## 2022-02-13 MED ORDER — ZOSTER VAC RECOMB ADJUVANTED 50 MCG/0.5ML IM SUSR: 0.5000 mL | Freq: Once | INTRAMUSCULAR | 0 refills | Status: AC

## 2022-02-13 MED ORDER — TRIAMCINOLONE ACETONIDE 0.1 % EX CREA
1.0000 | TOPICAL_CREAM | Freq: Two times a day (BID) | CUTANEOUS | 0 refills | Status: DC
  Filled 2022-02-13: qty 30, 15d supply, fill #0

## 2022-02-13 NOTE — Patient Instructions (Signed)
Vrtigo posicional benigno Benign Positional Vertigo El vrtigo es la sensacin de que usted o todo lo que lo rodea se mueve cuando en realidad eso no sucede. El vrtigo posicional benigno es el tipo de vrtigo ms comn. Generalmente se trata de una enfermedad no daina (benigna). Esta afeccin es posicional. Esto significa que los sntomas son desencadenados por ciertos movimientos y posiciones. Esta afeccin puede ser peligrosa si ocurre mientras est haciendo algo que podra suponer un riesgo para usted o para los dems. Incluye actividades como conducir u operar maquinaria. Cules son las causas? El odo interno tiene canales llenos de lquido que ayudan a que el cerebro perciba el movimiento y el equilibrio. Cuando el lquido se mueve, el cerebro recibe mensajes sobre la posicin del cuerpo. En el vrtigo posicional benigno, los cristales de calcio que estn en el odo interno se desprenden y alteran la zona del odo interno. Esto hace que el cerebro reciba mensajes confusos sobre la posicin del cuerpo. Qu incrementa el riesgo? Es ms probable que sufra esta afeccin si: Es mujer. Es mayor de 50 aos. Ha sufrido una lesin en la cabeza recientemente. Tiene una enfermedad en el odo interno. Cules son los signos o sntomas? Generalmente, los sntomas de este trastorno se presentan al mover la cabeza o los ojos en diferentes direcciones. Los sntomas pueden aparecer repentinamente y suelen durar menos de un minuto. Incluyen los siguientes: Prdida del equilibrio y cadas. Sensacin de estar dando vueltas o movindose. Sensacin de que el entorno est dando vueltas o movindose. Nuseas y vmitos. Visin borrosa. Mareos. Movimientos oculares involuntarios (nistagmo). Los sntomas pueden ser leves y solo causar problemas menores, o pueden ser graves e interferir en la vida cotidiana. Los episodios de vrtigo posicional benigno pueden repetirse (volver a aparecer) con el transcurso del  tiempo. Los sntomas tambin pueden mejorar con el tiempo. Cmo se diagnostica? Esta afeccin se puede diagnosticar en funcin de lo siguiente: Sus antecedentes mdicos. Un examen fsico de la cabeza, el cuello y los odos. Pruebas de posicin para detectar o estimular el vrtigo. Es posible que le pidan que gire la cabeza y cambie de posicin, como pasar de estar sentado a acostarse. El mdico estar pendiente por si aparecen sntomas de vrtigo. Tal vez lo deriven a un mdico especialista en problemas de la garganta, la nariz y el odo (otorrinolaringlogo), o a uno que se especializa en trastornos del sistema nervioso (neurlogo). Cmo se trata?  Esta afeccin se puede tratar en una sesin en la cual el mdico le pondr la cabeza en posiciones especficas para ayudar a que los cristales desplazados del odo interno se muevan. El tratamiento de esta afeccin puede llevar varias sesiones. Cuando los casos son graves, tal vez haya que realizar una ciruga, pero esto no es frecuente. En algunos casos, el vrtigo posicional benigno se resuelve por s solo en el trmino de 2 o 4 semanas. Siga estas instrucciones en su casa: Seguridad Muvase lentamente. Evite algunas posiciones o determinados movimientos repentinos de la cabeza y el cuerpo como se lo haya indicado el mdico. Evite conducir y operar maquinaria hasta que el mdico le indique que es seguro hacerlo. No haga ninguna tarea que podra ser peligrosa para usted o para otras personas en caso de vrtigo. Si tiene dificultad para caminar o mantener el equilibrio, use un bastn para mantener la estabilidad. Si se siente mareado o inestable, sintese de inmediato. Retome sus actividades normales segn lo indicado por el mdico. Pregntele al mdico qu actividades son   seguras para usted. Instrucciones generales Use los medicamentos de venta libre y los recetados solamente como se lo haya indicado el mdico. Beba suficiente lquido como para  mantener la orina de color amarillo plido. Concurra a todas las visitas de seguimiento. Esto es importante. Comunquese con un mdico si: Tiene fiebre. Su afeccin empeora o presenta sntomas nuevos. Sus familiares o amigos advierten cambios en su comportamiento. Tiene nuseas o vmitos que empeoran. Tiene adormecimiento o sensacin de pinchazos y hormigueo. Busque ayuda de inmediato si: Tiene dificultad para hablar o para moverse. Siempre est mareado o se desmaya. Presenta dolores de cabeza intensos. Tiene debilidad en los brazos o las piernas. Presenta cambios en la audicin o la visin. Presenta rigidez en el cuello. Presenta sensibilidad a la luz. Estos sntomas pueden representar un problema grave que constituye una emergencia. No espere a ver si los sntomas desaparecen. Solicite atencin mdica de inmediato. Comunquese con el servicio de emergencias de su localidad (911 en los [place]). No conduzca por sus propios medios hasta el hospital. Resumen El vrtigo es la sensacin de que usted o todo lo que lo rodea se mueve cuando en realidad eso no sucede. El vrtigo posicional benigno es el tipo de vrtigo ms comn. La causa de esta afeccin es el desplazamiento de los cristales de calcio del odo interno. Esto produce una alteracin en una zona del odo interno que ayuda al cerebro a percibir el movimiento y el equilibrio. Los sntomas incluyen prdida del equilibrio y cadas, sensacin de que usted o su entorno se mueve, nuseas y vmitos, y visin borrosa. Esta afeccin se puede diagnosticar en funcin de los sntomas, un examen fsico y pruebas de posicionamiento. Siga las instrucciones de seguridad que le haya dado el mdico y vaya a todas las visitas de seguimiento. Esto es importante. Esta informacin no tiene como fin reemplazar el consejo del mdico. Asegrese de hacerle al mdico cualquier pregunta que tenga. Document Revised: 01/11/2020 Document Reviewed:  01/11/2020 Elsevier Patient Education  2023 Elsevier Inc.  

## 2022-02-13 NOTE — Progress Notes (Signed)
Patient ID: Tammy Osborne, female    DOB: January 16, 1956  MRN: [id number]  CC: Diabetes (DM f/u. Tammy Osborne received flu vax this season. )   Subjective: Tammy Osborne is a 67 y.o. female who presents for chronic ds management Her concerns today include:  Patient with history of HTN,  DM 2, ILD, HL, anemia, GERD, aortic atherosclerosis   DM:   Results for orders placed or performed in visit on 02/13/22  POCT glucose (manual entry)  Result Value Ref Range   POC Glucose 92 70 - 99 mg/dl  POCT glycosylated hemoglobin (Hb A1C)  Result Value Ref Range   Hemoglobin A1C     HbA1c POC (<> result, manual entry)     HbA1c, POC (prediabetic range)     HbA1c, POC (controlled diabetic range) 6.1 0.0 - 7.0 %  Does not check BS Diet control.  Does well with eating habits For BP she should be on Coreg 12.5 mg BID, Losartan 50 mg and Norvasc10 mg.  She tells me she takes her medications but does not recall names and forget to bring meds.  Took meds already for today.  No device to check BP She  declined taking cholesterol med.  Feels she is on too much med.  Last LDL 131 Saw Dr. Shearon Stalls 12/18/2021.  She tells me Prednisone d/c and inhaler d/c.  Continued on CellCept.  C/o itching RT shin x 3 wks.  No initiating factors.  Some dizziness when she lay in bed x 1wk.  Last quick second.  Was happening a lot initially but now has not occurred in several days.  No associated headaches, ringing in the ear or hearing loss.  Patient Active Problem List   Diagnosis Date Noted   Aortic atherosclerosis ([place]) 09/21/2019   Hyperlipidemia associated with type 2 diabetes mellitus ([place]) 09/21/2019   Gastroesophageal reflux disease without esophagitis 04/04/2018   Controlled type 2 diabetes mellitus with diabetic polyneuropathy, without long-term current use of insulin ([place]) 10/19/2016   Essential hypertension 09/06/2016   Interstitial pulmonary disease ([place]) 09/06/2016   Cough in adult 09/06/2016    Anemia 09/06/2016   Hyperlipidemia 09/06/2016   Numbness of lower limb 09/06/2016     Current Outpatient Medications on File Prior to Visit  Medication Sig Dispense Refill   amLODipine (NORVASC) 10 MG tablet Take 1 tablet (10 mg total) by mouth daily. 30 tablet 6   aspirin EC 81 MG tablet Take 1 tablet (81 mg total) by mouth daily. 100 tablet 1   gabapentin (NEURONTIN) 100 MG capsule Take 2 caps by mouth twice daily 120 capsule 5   losartan (COZAAR) 50 MG tablet Take 1 tablet (50 mg total) by mouth daily. 90 tablet 1   mycophenolate (CELLCEPT) 500 MG tablet Take 1 tablet (500 mg total) by mouth 2 (two) times daily. 60 tablet 11   omeprazole (PRILOSEC) 20 MG capsule Take 1 capsule (20 mg total) by mouth 2 (two) times daily before a meal. 60 capsule 6   carvedilol (COREG) 12.5 MG tablet TAKE 1 TABLET (12.5 MG TOTAL) BY MOUTH 2 (TWO) TIMES DAILY WITH A MEAL. (Patient not taking: Reported on 02/13/2022) 60 tablet 6   No current facility-administered medications on file prior to visit.    No Known Allergies  Social History   Socioeconomic History   Marital status: Single    Spouse name: Not on file   Number of children: 2   Years of education: Not on file  Highest education level: Not on file  Occupational History   Not on file  Tobacco Use   Smoking status: Never    Passive exposure: Yes   Smokeless tobacco: Never   Tobacco comments:    spouse smoked in home X6-7 years.   Vaping Use   Vaping Use: Never used  Substance and Sexual Activity   Alcohol use: No   Drug use: Not on file   Sexual activity: Not on file  Other Topics Concern   Not on file  Social History Narrative   Not on file   Social Determinants of Health   Financial Resource Strain: Not on file  Food Insecurity: Not on file  Transportation Needs: Not on file  Physical Activity: Not on file  Stress: Not on file  Social Connections: Not on file  Intimate Partner Violence: Not on file    Family History   Problem Relation Age of Onset   Hypertension Mother     Past Surgical History:  Procedure Laterality Date   ABDOMINAL HYSTERECTOMY      ROS: Review of Systems Negative except as stated above  PHYSICAL EXAM: BP (!) 143/62 (BP Location: Left Arm, Patient Position: Sitting, Cuff Size: Normal)   Pulse 65   Temp 97.8 F (36.6 C) (Oral)   Ht 4\' 10"  (1.473 m)   Wt 134 lb (60.8 kg)   LMP  (LMP Unknown)   SpO2 95%   BMI 28.01 kg/m   Physical Exam  General appearance - alert, well appearing, and in no distress Mental status - normal mood, behavior, speech, dress, motor activity, and thought processes Neck - supple, no significant adenopathy Eye: Pink conjunctiva Mouth: Oral mucosa is moist. Chest - clear to auscultation, no wheezes, rales or rhonchi, symmetric air entry Heart - normal rate, regular rhythm, normal S1, S2, no murmurs, rubs, clicks or gallops Neurological -cranial nerves grossly intact.  Power 5/5 throughout.  Gait is normal. Extremities - peripheral pulses normal, no pedal edema, no clubbing or cyanosis Skin: She has some excoriation on the right shin laterally.     Latest Ref Rng & Units 11/07/2021    8:34 AM 09/29/2021   11:35 AM 08/11/2021   11:14 AM  CMP  Glucose 70 - 99 mg/dL 103  95  94   BUN 8 - 27 mg/dL 13  15  12    Creatinine 0.57 - 1.00 mg/dL 0.68  0.77  0.65   Sodium 134 - 144 mmol/L 139  140  141   Potassium 3.5 - 5.2 mmol/L 4.1  3.8  4.0   Chloride 96 - 106 mmol/L 101  102  107   CO2 20 - 29 mmol/L 22  20  21    Calcium 8.7 - 10.3 mg/dL 9.9  10.1  9.4   Total Protein 6.0 - 8.5 g/dL 7.4  7.3  6.7   Total Bilirubin 0.0 - 1.2 mg/dL 0.2  0.3  <0.2   Alkaline Phos 44 - 121 IU/L 156  157  140   AST 0 - 40 IU/L 20  21  20    ALT 0 - 32 IU/L 23  23  23     Lipid Panel     Component Value Date/Time   CHOL 262 (H) 06/16/2021 1209   TRIG 511 (H) 06/16/2021 1209   HDL 38 (L) 06/16/2021 1209   CHOLHDL 6.9 (H) 06/16/2021 1209   LDLCALC 131 (H)  06/16/2021 1209    CBC    Component Value Date/Time  WBC 7.2 11/07/2021 0834   WBC 8.8 02/27/2021 1439   RBC 5.80 (H) 11/07/2021 0834   RBC 5.02 02/27/2021 1439   HGB 15.4 11/07/2021 0834   HCT 47.1 (H) 11/07/2021 0834   PLT 376 11/07/2021 0834   MCV 81 11/07/2021 0834   MCH 26.6 11/07/2021 0834   MCH 27.0 08/15/2017 0611   MCHC 32.7 11/07/2021 0834   MCHC 33.0 02/27/2021 1439   RDW 15.2 11/07/2021 0834   LYMPHSABS 3.2 (H) 11/07/2021 0834   MONOABS 0.9 02/27/2021 1439   EOSABS 0.1 11/07/2021 0834   BASOSABS 0.0 11/07/2021 0834    ASSESSMENT AND PLAN:  1. Type 2 diabetes mellitus with peripheral neuropathy (HCC) At goal on diet control.  Encouraged her to continue healthy eating habits Encouraged to get eye exam when she is able to afford.  Patient is uninsured. - POCT glucose (manual entry) - POCT glycosylated hemoglobin (Hb A1C)  2. Hypertension associated with diabetes (HCC) Not at goal.  Patient does not know the names of her medications that she should be on.  She should be on Norvasc 10 mg daily, carvedilol 12.5 mg twice a day and Cozaar 50 mg daily.  Request that she follows up with the clinical pharmacist in 3 weeks and bring all of her medicines with her.  3. Hyperlipidemia associated with type 2 diabetes mellitus (HCC) Discussed cardiovascular risks associated with having diabetes, hypertension and hyperlipidemia.  Strongly advised taking cholesterol-lowering medication.  She is agreeable to trying the rosuvastatin.  Since she had not been taking it, I will resend the prescription for the 5 mg tablet instead of 20. - rosuvastatin (CRESTOR) 5 MG tablet; Take 1 tablet (5 mg total) by mouth daily.  Dispense: 90 tablet; Refill: 1  4. Benign paroxysmal positional vertigo, unspecified laterality Discussed diagnosis and management.  I was going to refer her for vestibular PT.  However patient states it has not occurred as frequent anymore.  She will let me know if it  starts occurring again.  5. Dermatitis - triamcinolone cream (KENALOG) 0.1 %; Apply 1 Application topically 2 (two) times daily.  Dispense: 30 g; Refill: 0  6. Hypersensitivity pneumonitis (HCC) Followed by pulmonary  7. Encounter for screening mammogram for malignant neoplasm of breast - MM Digital Screening; Future  8. Screening for colon cancer - Fecal occult blood, imunochemical([facility])  9. Need for shingles vaccine - Zoster Vaccine Adjuvanted [facility]) injection; Inject 0.5 mLs into the muscle once for 1 dose.  Dispense: 0.5 mL; Refill: 0   [employer] Language interpreter used during this encounter. #[id number] Osborne  Patient was given the opportunity to ask questions.  Patient verbalized understanding of the plan and was able to repeat key elements of the plan.   This documentation was completed using Paediatric nurse.  Any transcriptional errors are unintentional.  Orders Placed This Encounter  Procedures   Fecal occult blood, imunochemical([facility])   MM Digital Screening   POCT glucose (manual entry)   POCT glycosylated hemoglobin (Hb A1C)     Requested Prescriptions   Signed Prescriptions Disp Refills   rosuvastatin (CRESTOR) 5 MG tablet 90 tablet 1    Sig: Take 1 tablet (5 mg total) by mouth daily.   triamcinolone cream (KENALOG) 0.1 % 30 g 0    Sig: Apply 1 Application topically 2 (two) times daily.   Zoster Vaccine Adjuvanted [facility]) injection 0.5 mL 0    Sig: Inject 0.5 mLs into the muscle once for 1 dose.  Return in about 4 months (around 06/14/2022) for Appt with [facility] in 4 wks for BP check.  Karle Plumber, MD, FACP

## 2022-02-14 ENCOUNTER — Other Ambulatory Visit: Payer: Self-pay

## 2022-02-18 LAB — FECAL OCCULT BLOOD, IMMUNOCHEMICAL: Fecal Occult Bld: NEGATIVE

## 2022-03-01 ENCOUNTER — Telehealth: Payer: Self-pay

## 2022-03-01 NOTE — Telephone Encounter (Signed)
Telephoned patient using language line interpreter#[id number]. Left a voice message with [employer] (scholarship) contact information.

## 2022-03-15 ENCOUNTER — Ambulatory Visit
Admission: RE | Admit: 2022-03-15 | Discharge: 2022-03-15 | Disposition: A | Discharge status: Home / Self Care | Payer: No Typology Code available for payment source | Source: Ambulatory Visit | Attending: Internal Medicine | Admitting: Internal Medicine

## 2022-03-15 DIAGNOSIS — Z1231 Encounter for screening mammogram for malignant neoplasm of breast: Secondary | ICD-10-CM

## 2022-03-16 ENCOUNTER — Ambulatory Visit: Payer: Self-pay | Admitting: Pharmacist

## 2022-03-22 ENCOUNTER — Other Ambulatory Visit: Payer: Self-pay | Admitting: Internal Medicine

## 2022-03-22 ENCOUNTER — Other Ambulatory Visit: Payer: Self-pay

## 2022-03-22 DIAGNOSIS — M5430 Sciatica, unspecified side: Secondary | ICD-10-CM

## 2022-03-22 MED ORDER — GABAPENTIN 100 MG PO CAPS
ORAL_CAPSULE | ORAL | 5 refills | Status: DC
  Filled 2022-03-22: qty 120, 30d supply, fill #0

## 2022-03-22 NOTE — Telephone Encounter (Signed)
Requested Prescriptions  Pending Prescriptions Disp Refills   gabapentin (NEURONTIN) 100 MG capsule 120 capsule 5    Sig: Take 2 caps by mouth twice daily     Neurology: Anticonvulsants - gabapentin Passed - 03/22/2022  3:55 PM      Passed - Cr in normal range and within 360 days    Creatinine, Ser  Date Value Ref Range Status  11/07/2021 0.68 0.57 - 1.00 mg/dL Final         Passed - Completed PHQ-2 or PHQ-9 in the last 360 days      Passed - Valid encounter within last 12 months    Recent Outpatient Visits           1 month ago Type 2 diabetes mellitus with peripheral neuropathy [facility])   [place] Karle Plumber B, MD   5 months ago Type 2 diabetes mellitus with peripheral neuropathy [facility])   [place] Karle Plumber B, MD   9 months ago Type 2 diabetes mellitus with peripheral neuropathy [facility])   [place] Karle Plumber B, MD   1 year ago Controlled type 2 diabetes mellitus with diabetic polyneuropathy, without long-term current use of insulin [facility])   [place] Karle Plumber B, MD   1 year ago Controlled type 2 diabetes mellitus with diabetic polyneuropathy, without long-term current use of insulin [facility])   [place], MD       Future Appointments             In 2 months Wynetta Emery, Dalbert Batman, MD [place]

## 2022-05-01 ENCOUNTER — Other Ambulatory Visit: Payer: Self-pay | Admitting: Internal Medicine

## 2022-05-01 ENCOUNTER — Other Ambulatory Visit: Payer: Self-pay

## 2022-05-01 DIAGNOSIS — I152 Hypertension secondary to endocrine disorders: Secondary | ICD-10-CM

## 2022-05-01 DIAGNOSIS — I10 Essential (primary) hypertension: Secondary | ICD-10-CM

## 2022-05-01 DIAGNOSIS — K219 Gastro-esophageal reflux disease without esophagitis: Secondary | ICD-10-CM

## 2022-05-01 MED ORDER — AMLODIPINE BESYLATE 10 MG PO TABS
10.0000 mg | ORAL_TABLET | Freq: Every day | ORAL | 0 refills | Status: DC
  Filled 2022-05-01: qty 90, 90d supply, fill #0

## 2022-05-01 MED ORDER — OMEPRAZOLE 20 MG PO CPDR
20.0000 mg | DELAYED_RELEASE_CAPSULE | Freq: Two times a day (BID) | ORAL | 0 refills | Status: DC
  Filled 2022-05-01: qty 180, 90d supply, fill #0

## 2022-05-01 MED ORDER — CARVEDILOL 12.5 MG PO TABS
12.5000 mg | ORAL_TABLET | Freq: Two times a day (BID) | ORAL | 0 refills | Status: DC
  Filled 2022-05-01: qty 180, 90d supply, fill #0

## 2022-05-31 ENCOUNTER — Encounter: Payer: Self-pay | Admitting: Critical Care Medicine

## 2022-05-31 NOTE — Progress Notes (Signed)
pft  

## 2022-06-18 ENCOUNTER — Encounter: Payer: Self-pay | Admitting: Internal Medicine

## 2022-06-18 ENCOUNTER — Other Ambulatory Visit: Payer: Self-pay

## 2022-06-18 ENCOUNTER — Ambulatory Visit: Payer: Self-pay | Attending: Internal Medicine | Admitting: Internal Medicine

## 2022-06-18 VITALS — BP 145/65 | HR 56 | Temp 98.0°F | Resp 16 | Wt 137.0 lb

## 2022-06-18 DIAGNOSIS — E1169 Type 2 diabetes mellitus with other specified complication: Secondary | ICD-10-CM

## 2022-06-18 DIAGNOSIS — E785 Hyperlipidemia, unspecified: Secondary | ICD-10-CM

## 2022-06-18 DIAGNOSIS — K219 Gastro-esophageal reflux disease without esophagitis: Secondary | ICD-10-CM

## 2022-06-18 DIAGNOSIS — E1159 Type 2 diabetes mellitus with other circulatory complications: Secondary | ICD-10-CM

## 2022-06-18 DIAGNOSIS — M546 Pain in thoracic spine: Secondary | ICD-10-CM

## 2022-06-18 DIAGNOSIS — I10 Essential (primary) hypertension: Secondary | ICD-10-CM

## 2022-06-18 DIAGNOSIS — I152 Hypertension secondary to endocrine disorders: Secondary | ICD-10-CM

## 2022-06-18 DIAGNOSIS — E1142 Type 2 diabetes mellitus with diabetic polyneuropathy: Secondary | ICD-10-CM

## 2022-06-18 LAB — POCT GLYCOSYLATED HEMOGLOBIN (HGB A1C): HbA1c, POC (prediabetic range): 6.1 % (ref 5.7–6.4)

## 2022-06-18 LAB — GLUCOSE, POCT (MANUAL RESULT ENTRY): POC Glucose: 100 mg/dl — AB (ref 70–99)

## 2022-06-18 MED ORDER — METHOCARBAMOL 500 MG PO TABS
500.0000 mg | ORAL_TABLET | Freq: Every evening | ORAL | 1 refills | Status: DC | PRN
  Filled 2022-06-18: qty 30, 30d supply, fill #0

## 2022-06-18 MED ORDER — ROSUVASTATIN CALCIUM 5 MG PO TABS
5.0000 mg | ORAL_TABLET | Freq: Every day | ORAL | 1 refills | Status: DC
Start: 2022-06-18 — End: 2022-10-19
  Filled 2022-06-18: qty 30, 30d supply, fill #0
  Filled 2022-08-06: qty 30, 30d supply, fill #1

## 2022-06-18 MED ORDER — GABAPENTIN 100 MG PO CAPS
200.0000 mg | ORAL_CAPSULE | Freq: Two times a day (BID) | ORAL | 5 refills | Status: DC
Start: 2022-06-18 — End: 2023-08-14
  Filled 2022-06-18 – 2022-07-05 (×3): qty 120, 30d supply, fill #0
  Filled 2022-10-24: qty 120, 30d supply, fill #1
  Filled 2022-12-11: qty 120, 30d supply, fill #2
  Filled 2023-02-21: qty 120, 30d supply, fill #3
  Filled 2023-05-27: qty 120, 30d supply, fill #4

## 2022-06-18 MED ORDER — LOSARTAN POTASSIUM 100 MG PO TABS
100.0000 mg | ORAL_TABLET | Freq: Every day | ORAL | 1 refills | Status: DC
Start: 2022-06-18 — End: 2022-10-19
  Filled 2022-06-18: qty 30, 30d supply, fill #0
  Filled 2022-07-05 (×2): qty 90, 90d supply, fill #0

## 2022-06-18 MED ORDER — CARVEDILOL 12.5 MG PO TABS
12.5000 mg | ORAL_TABLET | Freq: Two times a day (BID) | ORAL | 0 refills | Status: DC
Start: 2022-06-18 — End: 2022-10-19
  Filled 2022-06-18: qty 180, 90d supply, fill #0

## 2022-06-18 MED ORDER — OMEPRAZOLE 20 MG PO CPDR
20.0000 mg | DELAYED_RELEASE_CAPSULE | Freq: Two times a day (BID) | ORAL | 0 refills | Status: DC
Start: 2022-06-18 — End: 2022-10-19
  Filled 2022-06-18: qty 180, 90d supply, fill #0

## 2022-06-18 MED ORDER — AMLODIPINE BESYLATE 10 MG PO TABS
10.0000 mg | ORAL_TABLET | Freq: Every day | ORAL | 0 refills | Status: DC
Start: 2022-06-18 — End: 2022-10-19
  Filled 2022-06-18 – 2022-07-05 (×3): qty 90, 90d supply, fill #0

## 2022-06-18 NOTE — Progress Notes (Signed)
DM Back pain x 1 month, taking advil and tylenol Bilateral burning on feet  [place]- [phone]

## 2022-06-18 NOTE — Progress Notes (Signed)
Patient ID: Tammy Osborne, female    DOB: [birth year]  MRN: [id number]  CC: Diabetes, Back Pain, and feet burning   Subjective: Tammy Osborne is a 67 y.o. female who presents for chronic ds management Her concerns today include:  Patient with history of HTN,  DM 2, ILD, HL, anemia, GERD, aortic atherosclerosis   [employer] Language interpreter used during this encounter. #[id number] Favre.  Celine Mans [id number]  DM: Results for orders placed or performed in visit on 06/18/22  Glucose (CBG)  Result Value Ref Range   POC Glucose 100 (A) 70 - 99 mg/dl  HgB N5A  Result Value Ref Range   Hemoglobin A1C     HbA1c POC (<> result, manual entry)     HbA1c, POC (prediabetic range) 6.1 5.7 - 6.4 %   HbA1c, POC (controlled diabetic range)    Diet controlled Due for eye exam, no insurance C/o burning in the soles of the feet x 2 wks.  Constant; no numbness; does a lot of standing at work; works in [employer] and does a lot of standing.  Should be on Gabapentin 200 mg BID but has been taking just 1 cap BID Taking and tolerating Crestor 5 mg daily.   HTN:  For BP she should be on Coreg 12.5 mg BID, Losartan 50 mg and Norvasc 10 mg.  Took meds already today and taking consistently Limit salt in foods No CP/SOB  C/o pain across mid back below scapular both sides x 3 wks No initiating factors but thinks it may be related to work standing in one position all day No radiation Uses Ibuprofen/Tylenol which helps some  ILD:  has f/u with pulmonary later this wk reports that her breathing has been good.  She is on CellCept.  GERD: Request refill on omeprazole. Patient Active Problem List   Diagnosis Date Noted   Aortic atherosclerosis (HCC) 09/21/2019   Hyperlipidemia associated with type 2 diabetes mellitus (HCC) 09/21/2019   Gastroesophageal reflux disease without esophagitis 04/04/2018   Controlled type 2 diabetes mellitus with diabetic polyneuropathy, without long-term current use of  insulin (HCC) 10/19/2016   Essential hypertension 09/06/2016   Interstitial pulmonary disease (HCC) 09/06/2016   Cough in adult 09/06/2016   Anemia 09/06/2016   Hyperlipidemia 09/06/2016   Numbness of lower limb 09/06/2016     Current Outpatient Medications on File Prior to Visit  Medication Sig Dispense Refill   aspirin EC 81 MG tablet Take 1 tablet (81 mg total) by mouth daily. 100 tablet 1   mycophenolate (CELLCEPT) 500 MG tablet Take 1 tablet (500 mg total) by mouth 2 (two) times daily. 60 tablet 11   triamcinolone cream (KENALOG) 0.1 % Apply 1 Application topically 2 (two) times daily. 30 g 0   No current facility-administered medications on file prior to visit.    No Known Allergies  Social History   Socioeconomic History   Marital status: Single    Spouse name: Not on file   Number of children: 2   Years of education: Not on file   Highest education level: Not on file  Occupational History   Not on file  Tobacco Use   Smoking status: Never    Passive exposure: Yes   Smokeless tobacco: Never   Tobacco comments:    spouse smoked in home X6-7 years.   Vaping Use   Vaping Use: Never used  Substance and Sexual Activity   Alcohol use: No   Drug use:  Not on file   Sexual activity: Not on file  Other Topics Concern   Not on file  Social History Narrative   Not on file   Social Determinants of Health   Financial Resource Strain: Not on file  Food Insecurity: Not on file  Transportation Needs: Not on file  Physical Activity: Not on file  Stress: Not on file  Social Connections: Not on file  Intimate Partner Violence: Not on file    Family History  Problem Relation Age of Onset   Hypertension Mother     Past Surgical History:  Procedure Laterality Date   ABDOMINAL HYSTERECTOMY      ROS: Review of Systems Negative except as stated above  PHYSICAL EXAM: BP (!) 145/65   Pulse (!) 56   Temp 98 F (36.7 C) (Oral)   Resp 16   Wt 137 lb (62.1 kg)    LMP  (LMP Unknown)   SpO2 98%   BMI 28.63 kg/m   Physical Exam  General appearance - alert, well appearing, and in no distress Mental status - normal mood, behavior, speech, dress, motor activity, and thought processes Neck - supple, no significant adenopathy Chest - clear to auscultation, no wheezes, rales or rhonchi, symmetric air entry Heart - normal rate, regular rhythm, normal S1, S2, no murmurs, rubs, clicks or gallops Extremities - peripheral pulses normal, no pedal edema, no clubbing or cyanosis MSK: No tenderness on palpation of the thoracic spine and surrounding paraspinal muscles.     Latest Ref Rng & Units 11/07/2021    8:34 AM 09/29/2021   11:35 AM 08/11/2021   11:14 AM  CMP  Glucose 70 - 99 mg/dL 161  95  94   BUN 8 - 27 mg/dL 13  15  12    Creatinine 0.57 - 1.00 mg/dL 0.96  0.45  4.09   Sodium 134 - 144 mmol/L 139  140  141   Potassium 3.5 - 5.2 mmol/L 4.1  3.8  4.0   Chloride 96 - 106 mmol/L 101  102  107   CO2 20 - 29 mmol/L 22  20  21    Calcium 8.7 - 10.3 mg/dL 9.9  81.1  9.4   Total Protein 6.0 - 8.5 g/dL 7.4  7.3  6.7   Total Bilirubin 0.0 - 1.2 mg/dL 0.2  0.3  <9.1   Alkaline Phos 44 - 121 IU/L 156  157  140   AST 0 - 40 IU/L 20  21  20    ALT 0 - 32 IU/L 23  23  23     Lipid Panel     Component Value Date/Time   CHOL 262 (H) 06/16/2021 1209   TRIG 511 (H) 06/16/2021 1209   HDL 38 (L) 06/16/2021 1209   CHOLHDL 6.9 (H) 06/16/2021 1209   LDLCALC 131 (H) 06/16/2021 1209    CBC    Component Value Date/Time   WBC 7.2 11/07/2021 0834   WBC 8.8 02/27/2021 1439   RBC 5.80 (H) 11/07/2021 0834   RBC 5.02 02/27/2021 1439   HGB 15.4 11/07/2021 0834   HCT 47.1 (H) 11/07/2021 0834   PLT 376 11/07/2021 0834   MCV 81 11/07/2021 0834   MCH 26.6 11/07/2021 0834   MCH 27.0 08/15/2017 0611   MCHC 32.7 11/07/2021 0834   MCHC 33.0 02/27/2021 1439   RDW 15.2 11/07/2021 0834   LYMPHSABS 3.2 (H) 11/07/2021 0834   MONOABS 0.9 02/27/2021 1439   EOSABS 0.1 11/07/2021  0834   BASOSABS  0.0 11/07/2021 0834    ASSESSMENT AND PLAN: 1. Type 2 diabetes mellitus with peripheral neuropathy (HCC) Continue healthy eating habits. Advised her that the gabapentin is written to be taking as 200 mg twice a day.  I want her to try the increased dose to see if it helps decrease the burning in her feet. Get eye exam when she is able to afford it. - Glucose (CBG) - HgB A1c - gabapentin (NEURONTIN) 100 MG capsule; Take 2 capsules (200 mg total) by mouth 2 (two) times daily.  Dispense: 120 capsule; Refill: 5  2. Hypertension associated with diabetes (HCC) Not at goal.  Increase Cozaar to 100 mg daily.  Continue current dose of amlodipine and carvedilol. - amLODipine (NORVASC) 10 MG tablet; Take 1 tablet (10 mg total) by mouth daily.  Dispense: 90 tablet; Refill: 0 - losartan (COZAAR) 100 MG tablet; Take 1 tablet (100 mg total) by mouth daily.  Dispense: 90 tablet; Refill: 1  3. Hyperlipidemia associated with type 2 diabetes mellitus (HCC) - rosuvastatin (CRESTOR) 5 MG tablet; Take 1 tablet (5 mg total) by mouth daily.  Dispense: 90 tablet; Refill: 1  4. Gastroesophageal reflux disease without esophagitis - omeprazole (PRILOSEC) 20 MG capsule; Take 1 capsule (20 mg total) by mouth 2 (two) times daily before a meal.  Dispense: 180 capsule; Refill: 0  6. Bilateral thoracic back pain, unspecified chronicity Most likely muscle strain associated with her job. Recommend using heating pad in the evenings.  Will try with Robaxin to take at bedtime.  Advised that the medicine can cause drowsiness. - methocarbamol (ROBAXIN) 500 MG tablet; Take 1 tablet (500 mg total) by mouth at bedtime as needed for muscle spasms.  Dispense: 30 tablet; Refill: 1     Patient was given the opportunity to ask questions.  Patient verbalized understanding of the plan and was able to repeat key elements of the plan.   This documentation was completed using Paediatric nurse.  Any  transcriptional errors are unintentional.  Orders Placed This Encounter  Procedures   Glucose (CBG)   HgB A1c     Requested Prescriptions   Signed Prescriptions Disp Refills   methocarbamol (ROBAXIN) 500 MG tablet 30 tablet 1    Sig: Take 1 tablet (500 mg total) by mouth at bedtime as needed for muscle spasms.   amLODipine (NORVASC) 10 MG tablet 90 tablet 0    Sig: Take 1 tablet (10 mg total) by mouth daily.   carvedilol (COREG) 12.5 MG tablet 180 tablet 0    Sig: Take 1 tablet (12.5 mg total) by mouth 2 (two) times daily with a meal.   gabapentin (NEURONTIN) 100 MG capsule 120 capsule 5    Sig: Take 2 capsules (200 mg total) by mouth 2 (two) times daily.   losartan (COZAAR) 100 MG tablet 90 tablet 1    Sig: Take 1 tablet (100 mg total) by mouth daily.   omeprazole (PRILOSEC) 20 MG capsule 180 capsule 0    Sig: Take 1 capsule (20 mg total) by mouth 2 (two) times daily before a meal.   rosuvastatin (CRESTOR) 5 MG tablet 90 tablet 1    Sig: Take 1 tablet (5 mg total) by mouth daily.    Return in about 4 months (around 10/19/2022).  Jonah Blue, MD, FACP

## 2022-06-20 ENCOUNTER — Ambulatory Visit (INDEPENDENT_AMBULATORY_CARE_PROVIDER_SITE_OTHER): Payer: Self-pay | Admitting: Internal Medicine

## 2022-06-20 ENCOUNTER — Encounter: Payer: Self-pay | Admitting: Internal Medicine

## 2022-06-20 VITALS — BP 130/70 | HR 61 | Ht <= 58 in | Wt 139.8 lb

## 2022-06-20 DIAGNOSIS — J679 Hypersensitivity pneumonitis due to unspecified organic dust: Secondary | ICD-10-CM

## 2022-06-20 DIAGNOSIS — Z5181 Encounter for therapeutic drug level monitoring: Secondary | ICD-10-CM

## 2022-06-20 NOTE — Progress Notes (Signed)
Tammy Osborne    [id number]    03/03/1955  Primary Care Physician:Johnson, Binnie Rail, MD Date of Appointment: 06/20/2022 Established Patient Visit  Chief complaint:   Chief Complaint  Patient presents with   Follow-up    F/up on PFT and LOV.     HPI: Tammy Osborne is a 67 y.o. woman spanish speaking who presents for hypersensitivity pneumonitis.  Interpreter present. Intolerant of azathioprine due to hepatotoxicity.   Interval Updates:  Here for follow up with interpreter. Tolerating cell cept 500 mg without adverse effects. No abdominal pain or diarrhea  Tolerating cell cept 500 mg BID and off prednisone without issues. Here for follow up after PFTs which show stable to improved FVC and DLCO since starting prednisone and cell cept.   Stopped working at [employer]. She is now working in [place].   Last labs reviewed in December 2023 wnl. She didn't get labs since then because she said when she went to the lab they didn't have the order.   Social History: [place] here around [place]. Never smoker, but did have indoor wood burning stove in childhood.  Has lived in the same house for 5 years which is when  her symptoms started. With her brother sister and a nephew. No one else has breathing problems at home. She has worked in the same job for the last 5 years. No pets at home. No chickens or birds. Works at [employer] in [place]. 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 No musical instruments. No visible mold. Does not use down feather pillows. Has mice in her home, but no bats.   I have reviewed the patient's family social and past medical history and updated as appropriate.   Past Medical History:  Diagnosis Date   Diabetes mellitus without complication (HCC)    Hypertension     Past Surgical History:  Procedure Laterality Date   ABDOMINAL HYSTERECTOMY      Family History  Problem  Relation Age of Onset   Hypertension Mother     Social History   Occupational History   Not on file  Tobacco Use   Smoking status: Never    Passive exposure: Yes   Smokeless tobacco: Never   Tobacco comments:    spouse smoked in home X6-7 years.   Vaping Use   Vaping Use: Never used  Substance and Sexual Activity   Alcohol use: No   Drug use: Not on file   Sexual activity: Not on file     Physical Exam: Blood pressure 130/70, pulse 61, height 4\' 10"  (1.473 m), weight 139 lb 12.8 oz (63.4 kg), SpO2 96 %.  Gen:     NAD Lungs:   ctab no wheezes or crackles CV:       RRR no mrg   Data Reviewed: Imaging: I have personally reviewed the CT scan from Sept 2020 which shows mosaic attenuation. The ct chest in 2018 has diffuse GGOs which have improved in 2020.   PFTs:      Latest Ref Rng & Units 12/18/2021   11:42 AM 03/23/2020   10:39 AM 06/18/2017   10:36 AM 11/22/2016   10:43 AM  PFT Results  FVC-Pre L 1.71  1.20  1.57  1.73   FVC-Predicted Pre % 69  47  58  63   FVC-Post L 1.73  1.23  1.58  1.76  FVC-Predicted Post % 70  48  58  64   Pre FEV1/FVC % % 93  99  91  92   Post FEV1/FCV % % 92  97  92  91   FEV1-Pre L 1.60  1.18  1.42  1.59   FEV1-Predicted Pre % 85  61  68  76   FEV1-Post L 1.59  1.19  1.46  1.60   DLCO uncorrected ml/min/mmHg 12.09   11.45  11.90   DLCO UNC% % 75   65  67   DLCO corrected ml/min/mmHg 11.44    11.75   DLCO COR %Predicted % 71    67   DLVA Predicted % 87   104  98   TLC L 3.62  2.51  2.99  2.81   TLC % Predicted % 87  60  69  65   RV % Predicted % 57  45  42  55    I have personally reviewed the patient's PFTs and they show moderate restriction to ventilation. FVC Nov 2023 shows stable lung function (improved actually) to 1 and 4 years prior. DLCO stable.   Labs: Labs reviewed - had autoimmune serologies negative in 2018. HP panel negative.  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    Plan/Recommendations: Full PFTs before next visit.  Please get blood work today. CBC and CMP Come back every two months for this to the lab in our office.   If lung function is stable after next labs we can discontinue the cell cept medication.   She has changed jobs from the [employer] so hopefully she does not have recurrence.  Return to Care: Return in about 6 months (around 12/21/2022) for follow up with PFT and same day appointment.   Durel Salts, MD Pulmonary and Critical Care Medicine [facility] [phone]

## 2022-06-20 NOTE — Patient Instructions (Addendum)
Please schedule follow up scheduled with myself in 6 months.  If my schedule is not open yet, we will contact you with a reminder closer to that time. Please call [phone] if you haven't heard from [place] a month before.   Before your next visit I would like you to have: Full PFTs  Please get blood work today.   Come back every two months for this to the lab in our office.   If lung function is stable after next labs we can discontinue the cell cept medication.    Por favor programe un seguimiento programado conmigo mismo en 6 meses. Si mi horario an no est abierto, nos comunicaremos con usted con un recordatorio ms cerca de esa hora. Llame al [phone] si no ha tenido noticias nuestras un mes antes.  Antes de tu prxima visita me gustara que tuvieras: PFT completos  Por favor, hgase un anlisis de [employer].  [employer] meses para esto al laboratorio de [place] oficina.  Si la funcin pulmonar es estable despus de los prximos anlisis, podemos suspender la medicacin con cepto Aeronautical engineer.

## 2022-06-21 LAB — CBC WITH DIFFERENTIAL/PLATELET
Basophils Absolute: 0.1 10*3/uL (ref 0.0–0.1)
Basophils Relative: 0.7 % (ref 0.0–3.0)
Eosinophils Absolute: 0 10*3/uL (ref 0.0–0.7)
Eosinophils Relative: 0.2 % (ref 0.0–5.0)
HCT: 39.8 % (ref 36.0–46.0)
Hemoglobin: 13 g/dL (ref 12.0–15.0)
Lymphocytes Relative: 46.9 % — ABNORMAL HIGH (ref 12.0–46.0)
Lymphs Abs: 3.7 10*3/uL (ref 0.7–4.0)
MCHC: 32.6 g/dL (ref 30.0–36.0)
MCV: 83.1 fl (ref 78.0–100.0)
Monocytes Absolute: 0.6 10*3/uL (ref 0.1–1.0)
Monocytes Relative: 7.2 % (ref 3.0–12.0)
Neutro Abs: 3.6 10*3/uL (ref 1.4–7.7)
Neutrophils Relative %: 45 % (ref 43.0–77.0)
Platelets: 384 10*3/uL (ref 150.0–400.0)
RBC: 4.79 Mil/uL (ref 3.87–5.11)
RDW: 15.1 % (ref 11.5–15.5)
WBC: 8 10*3/uL (ref 4.0–10.5)

## 2022-06-21 LAB — COMPREHENSIVE METABOLIC PANEL
ALT: 14 U/L (ref 0–35)
AST: 15 U/L (ref 0–37)
Albumin: 4.2 g/dL (ref 3.5–5.2)
Alkaline Phosphatase: 100 U/L (ref 39–117)
BUN: 17 mg/dL (ref 6–23)
CO2: 25 mEq/L (ref 19–32)
Calcium: 9 mg/dL (ref 8.4–10.5)
Chloride: 107 mEq/L (ref 96–112)
Creatinine, Ser: 0.74 mg/dL (ref 0.40–1.20)
GFR: 84.59 mL/min (ref >60.00)
Glucose, Bld: 107 mg/dL — ABNORMAL HIGH (ref 70–99)
Potassium: 3.9 mEq/L (ref 3.5–5.1)
Sodium: 139 mEq/L (ref 135–145)
Total Bilirubin: 0.2 mg/dL (ref 0.2–1.2)
Total Protein: 6.8 g/dL (ref 6.0–8.3)

## 2022-07-05 ENCOUNTER — Other Ambulatory Visit: Payer: Self-pay

## 2022-07-09 ENCOUNTER — Other Ambulatory Visit: Payer: Self-pay

## 2022-08-06 ENCOUNTER — Other Ambulatory Visit: Payer: Self-pay

## 2022-10-19 ENCOUNTER — Encounter: Payer: Self-pay | Admitting: Internal Medicine

## 2022-10-19 ENCOUNTER — Ambulatory Visit: Payer: Self-pay | Attending: Internal Medicine | Admitting: Internal Medicine

## 2022-10-19 ENCOUNTER — Other Ambulatory Visit: Payer: Self-pay

## 2022-10-19 VITALS — BP 170/75 | HR 84 | Temp 98.2°F | Ht <= 58 in | Wt 134.0 lb

## 2022-10-19 DIAGNOSIS — K219 Gastro-esophageal reflux disease without esophagitis: Secondary | ICD-10-CM

## 2022-10-19 DIAGNOSIS — E1159 Type 2 diabetes mellitus with other circulatory complications: Secondary | ICD-10-CM

## 2022-10-19 DIAGNOSIS — E1169 Type 2 diabetes mellitus with other specified complication: Secondary | ICD-10-CM

## 2022-10-19 DIAGNOSIS — E1142 Type 2 diabetes mellitus with diabetic polyneuropathy: Secondary | ICD-10-CM

## 2022-10-19 DIAGNOSIS — E785 Hyperlipidemia, unspecified: Secondary | ICD-10-CM

## 2022-10-19 DIAGNOSIS — I152 Hypertension secondary to endocrine disorders: Secondary | ICD-10-CM

## 2022-10-19 DIAGNOSIS — M546 Pain in thoracic spine: Secondary | ICD-10-CM

## 2022-10-19 LAB — POCT GLYCOSYLATED HEMOGLOBIN (HGB A1C): HbA1c, POC (controlled diabetic range): 6.3 % (ref 0.0–7.0)

## 2022-10-19 LAB — GLUCOSE, POCT (MANUAL RESULT ENTRY): POC Glucose: 99 mg/dl (ref 70–99)

## 2022-10-19 MED ORDER — ROSUVASTATIN CALCIUM 5 MG PO TABS
5.0000 mg | ORAL_TABLET | Freq: Every day | ORAL | 2 refills | Status: DC
  Filled 2022-10-19: qty 90, 90d supply, fill #0

## 2022-10-19 MED ORDER — AMLODIPINE BESYLATE 10 MG PO TABS
10.0000 mg | ORAL_TABLET | Freq: Every day | ORAL | 2 refills | Status: DC
Start: 2022-10-19 — End: 2023-06-27
  Filled 2022-10-19: qty 90, 90d supply, fill #0
  Filled 2023-02-21: qty 90, 90d supply, fill #1
  Filled 2023-05-27: qty 90, 90d supply, fill #2

## 2022-10-19 MED ORDER — LOSARTAN POTASSIUM 100 MG PO TABS
100.0000 mg | ORAL_TABLET | Freq: Every day | ORAL | 1 refills | Status: DC
  Filled 2022-10-19: qty 90, 90d supply, fill #0

## 2022-10-19 MED ORDER — METHOCARBAMOL 500 MG PO TABS
500.0000 mg | ORAL_TABLET | Freq: Every evening | ORAL | 1 refills | Status: DC | PRN
  Filled 2022-10-19: qty 30, 30d supply, fill #0

## 2022-10-19 MED ORDER — OMEPRAZOLE 20 MG PO CPDR
20.0000 mg | DELAYED_RELEASE_CAPSULE | Freq: Two times a day (BID) | ORAL | 2 refills | Status: AC
  Filled 2022-10-19: qty 180, 90d supply, fill #0
  Filled 2023-02-21: qty 180, 90d supply, fill #1

## 2022-10-19 MED ORDER — CARVEDILOL 12.5 MG PO TABS
12.5000 mg | ORAL_TABLET | Freq: Two times a day (BID) | ORAL | 2 refills | Status: DC
  Filled 2022-10-19: qty 180, 90d supply, fill #0

## 2022-10-19 NOTE — Progress Notes (Signed)
Patient ID: Tammy Osborne, female    DOB: March 01, 1955  MRN: [id number]  CC: Diabetes (DM f/u.)   Subjective: Tammy Osborne is a 67 y.o. female who presents for chronic ds management. Her concerns today include:  Patient with history of HTN,  DM 2 with neuropathy, ILD, HL, anemia, GERD, aortic atherosclerosis   AMN Language interpreter used during this encounter. #[id number]  DM: Results for orders placed or performed in visit on 10/19/22  POCT glycosylated hemoglobin (Hb A1C)  Result Value Ref Range   Hemoglobin A1C     HbA1c POC (<> result, manual entry)     HbA1c, POC (prediabetic range)     HbA1c, POC (controlled diabetic range) 6.3 0.0 - 7.0 %  POCT glucose (manual entry)  Result Value Ref Range   POC Glucose 99 70 - 99 mg/dl  Pt is diet control.  Does well with eating habits; avoid sugary foods. Does not check BS. Walks daily for 15-20 mins No eye exam as yet.  Not able to afford  HTN:  should be on Coreg 12.5 mg BID, Losartan 100 mg and Norvasc 10 mg.  Forgot to take meds this morning.  Just took them about 20 mins ago.  Reports she is very good about taking her meds, just forgot this a.m.  Has home BP monitoring device and checks several times a wk.  Reports range BP 120-130/7075 Limit salt in foods No CP/SOB/LE edema  HL: on Crestor 5 mg and taking consistently  ILD:  saw Dr. Celine Mans in May 2024. Taking and tolerating Cellcept.  No diarrhea.    Request refill on Robaxin for back pain.  HM:  Received flu shot at work today.   Due for Shingles vaccine.  Wants to hold off on getting this today.  States she would get it on next visit Patient Active Problem List   Diagnosis Date Noted   Aortic atherosclerosis (HCC) 09/21/2019   Hyperlipidemia associated with type 2 diabetes mellitus (HCC) 09/21/2019   Gastroesophageal reflux disease without esophagitis 04/04/2018   Controlled type 2 diabetes mellitus with diabetic polyneuropathy, without long-term  current use of insulin (HCC) 10/19/2016   Essential hypertension 09/06/2016   Interstitial pulmonary disease (HCC) 09/06/2016   Cough in adult 09/06/2016   Anemia 09/06/2016   Hyperlipidemia 09/06/2016   Numbness of lower limb 09/06/2016     Current Outpatient Medications on File Prior to Visit  Medication Sig Dispense Refill   aspirin EC 81 MG tablet Take 1 tablet (81 mg total) by mouth daily. 100 tablet 1   gabapentin (NEURONTIN) 100 MG capsule Take 2 capsules (200 mg total) by mouth 2 (two) times daily. 120 capsule 5   mycophenolate (CELLCEPT) 500 MG tablet Take 1 tablet (500 mg total) by mouth 2 (two) times daily. 60 tablet 11   triamcinolone cream (KENALOG) 0.1 % Apply 1 Application topically 2 (two) times daily. (Patient not taking: Reported on 10/19/2022) 30 g 0   No current facility-administered medications on file prior to visit.    No Known Allergies  Social History   Socioeconomic History   Marital status: Single    Spouse name: Not on file   Number of children: 2   Years of education: Not on file   Highest education level: Not on file  Occupational History   Not on file  Tobacco Use   Smoking status: Never    Passive exposure: Yes   Smokeless tobacco: Never   Tobacco  comments:    spouse smoked in home X6-7 years.   Vaping Use   Vaping status: Never Used  Substance and Sexual Activity   Alcohol use: No   Drug use: Not on file   Sexual activity: Not on file  Other Topics Concern   Not on file  Social History Narrative   Not on file   Social Determinants of Health   Financial Resource Strain: Not on file  Food Insecurity: Not on file  Transportation Needs: Not on file  Physical Activity: Not on file  Stress: Not on file  Social Connections: Not on file  Intimate Partner Violence: Not on file    Family History  Problem Relation Age of Onset   Hypertension Mother     Past Surgical History:  Procedure Laterality Date   ABDOMINAL HYSTERECTOMY       ROS: Review of Systems Negative except as stated above  PHYSICAL EXAM: BP (!) 170/75   Pulse 84   Temp 98.2 F (36.8 C) (Oral)   Ht 4\' 10"  (1.473 m)   Wt 134 lb (60.8 kg)   LMP  (LMP Unknown)   SpO2 96%   BMI 28.01 kg/m   Physical Exam  General appearance - alert, well appearing, and in no distress Mental status - normal mood, behavior, speech, dress, motor activity, and thought processes Neck - supple, no significant adenopathy Chest - clear to auscultation, no wheezes, rales or rhonchi, symmetric air entry Heart - normal rate, regular rhythm, normal S1, S2, no murmurs, rubs, clicks or gallops Extremities - peripheral pulses normal, no pedal edema, no clubbing or cyanosis      Latest Ref Rng & Units 06/20/2022    3:55 PM 11/07/2021    8:34 AM 09/29/2021   11:35 AM  CMP  Glucose 70 - 99 mg/dL 409  811  95   BUN 6 - 23 mg/dL 17  13  15    Creatinine 0.40 - 1.20 mg/dL 9.14  7.82  9.56   Sodium 135 - 145 mEq/L 139  139  140   Potassium 3.5 - 5.1 mEq/L 3.9  4.1  3.8   Chloride 96 - 112 mEq/L 107  101  102   CO2 19 - 32 mEq/L 25  22  20    Calcium 8.4 - 10.5 mg/dL 9.0  9.9  21.3   Total Protein 6.0 - 8.3 g/dL 6.8  7.4  7.3   Total Bilirubin 0.2 - 1.2 mg/dL 0.2  0.2  0.3   Alkaline Phos 39 - 117 U/L 100  156  157   AST 0 - 37 U/L 15  20  21    ALT 0 - 35 U/L 14  23  23     Lipid Panel     Component Value Date/Time   CHOL 262 (H) 06/16/2021 1209   TRIG 511 (H) 06/16/2021 1209   HDL 38 (L) 06/16/2021 1209   CHOLHDL 6.9 (H) 06/16/2021 1209   LDLCALC 131 (H) 06/16/2021 1209    CBC    Component Value Date/Time   WBC 8.0 06/20/2022 1555   RBC 4.79 06/20/2022 1555   HGB 13.0 06/20/2022 1555   HGB 15.4 11/07/2021 0834   HCT 39.8 06/20/2022 1555   HCT 47.1 (H) 11/07/2021 0834   PLT 384.0 06/20/2022 1555   PLT 376 11/07/2021 0834   MCV 83.1 06/20/2022 1555   MCV 81 11/07/2021 0834   MCH 26.6 11/07/2021 0834   MCH 27.0 08/15/2017 0611   MCHC 32.6 06/20/2022  1555    RDW 15.1 06/20/2022 1555   RDW 15.2 11/07/2021 0834   LYMPHSABS 3.7 06/20/2022 1555   LYMPHSABS 3.2 (H) 11/07/2021 0834   MONOABS 0.6 06/20/2022 1555   EOSABS 0.0 06/20/2022 1555   EOSABS 0.1 11/07/2021 0834   BASOSABS 0.1 06/20/2022 1555   BASOSABS 0.0 11/07/2021 0834    ASSESSMENT AND PLAN: 1. Type 2 diabetes mellitus with peripheral neuropathy (HCC) Diet control.  Continue healthy eating habits and regular exercise. - POCT glycosylated hemoglobin (Hb A1C) - POCT glucose (manual entry) - Microalbumin / creatinine urine ratio  2. Hypertension associated with diabetes (HCC) Not at goal.  Patient took medications for late today but reports good home blood pressure readings.  I will make no changes to her current doses.  She will continue the current medications.  I will have her follow-up with the clinical pharmacist for repeat blood pressure check in 2 weeks. - losartan (COZAAR) 100 MG tablet; Take 1 tablet (100 mg total) by mouth daily.  Dispense: 90 tablet; Refill: 1 - carvedilol (COREG) 12.5 MG tablet; Take 1 tablet (12.5 mg total) by mouth 2 (two) times daily with a meal.  Dispense: 180 tablet; Refill: 2 - amLODipine (NORVASC) 10 MG tablet; Take 1 tablet (10 mg total) by mouth daily.  Dispense: 90 tablet; Refill: 2  3. Hyperlipidemia associated with type 2 diabetes mellitus (HCC) Continue Crestor. - rosuvastatin (CRESTOR) 5 MG tablet; Take 1 tablet (5 mg total) by mouth daily.  Dispense: 90 tablet; Refill: 2  4. Gastroesophageal reflux disease without esophagitis - omeprazole (PRILOSEC) 20 MG capsule; Take 1 capsule (20 mg total) by mouth 2 (two) times daily before a meal.  Dispense: 180 capsule; Refill: 2  5. Bilateral thoracic back pain, unspecified chronicity - methocarbamol (ROBAXIN) 500 MG tablet; Take 1 tablet (500 mg total) by mouth at bedtime as needed for muscle spasms.  Dispense: 30 tablet; Refill: 1    Patient was given the opportunity to ask questions.  Patient  verbalized understanding of the plan and was able to repeat key elements of the plan.   This documentation was completed using Paediatric nurse.  Any transcriptional errors are unintentional.  Orders Placed This Encounter  Procedures   Microalbumin / creatinine urine ratio   POCT glycosylated hemoglobin (Hb A1C)   POCT glucose (manual entry)     Requested Prescriptions   Signed Prescriptions Disp Refills   omeprazole (PRILOSEC) 20 MG capsule 180 capsule 2    Sig: Take 1 capsule (20 mg total) by mouth 2 (two) times daily before a meal.   losartan (COZAAR) 100 MG tablet 90 tablet 1    Sig: Take 1 tablet (100 mg total) by mouth daily.   carvedilol (COREG) 12.5 MG tablet 180 tablet 2    Sig: Take 1 tablet (12.5 mg total) by mouth 2 (two) times daily with a meal.   rosuvastatin (CRESTOR) 5 MG tablet 90 tablet 2    Sig: Take 1 tablet (5 mg total) by mouth daily.   amLODipine (NORVASC) 10 MG tablet 90 tablet 2    Sig: Take 1 tablet (10 mg total) by mouth daily.   methocarbamol (ROBAXIN) 500 MG tablet 30 tablet 1    Sig: Take 1 tablet (500 mg total) by mouth at bedtime as needed for muscle spasms.    Return in about 4 months (around 02/18/2023) for Appt with [facility] in 2 wks for BP check.  Jonah Blue, MD, FACP

## 2022-10-21 LAB — MICROALBUMIN / CREATININE URINE RATIO
Creatinine, Urine: 40 mg/dL
Microalb/Creat Ratio: 528 mg/g creat — ABNORMAL HIGH (ref 0–29)
Microalbumin, Urine: 211.1 ug/mL

## 2022-10-24 ENCOUNTER — Other Ambulatory Visit: Payer: Self-pay

## 2022-11-07 ENCOUNTER — Other Ambulatory Visit (INDEPENDENT_AMBULATORY_CARE_PROVIDER_SITE_OTHER): Payer: Self-pay

## 2022-11-07 ENCOUNTER — Other Ambulatory Visit: Payer: Self-pay | Admitting: *Deleted

## 2022-11-07 DIAGNOSIS — Z5181 Encounter for therapeutic drug level monitoring: Secondary | ICD-10-CM

## 2022-11-07 LAB — COMPREHENSIVE METABOLIC PANEL
ALT: 14 U/L (ref 0–35)
AST: 14 U/L (ref 0–37)
Albumin: 4.6 g/dL (ref 3.5–5.2)
Alkaline Phosphatase: 116 U/L (ref 39–117)
BUN: 21 mg/dL (ref 6–23)
CO2: 26 meq/L (ref 19–32)
Calcium: 10 mg/dL (ref 8.4–10.5)
Chloride: 104 meq/L (ref 96–112)
Creatinine, Ser: 0.91 mg/dL (ref 0.40–1.20)
GFR: 65.82 mL/min (ref >60.00)
Glucose, Bld: 86 mg/dL (ref 70–99)
Potassium: 3.7 meq/L (ref 3.5–5.1)
Sodium: 139 meq/L (ref 135–145)
Total Bilirubin: 0.3 mg/dL (ref 0.2–1.2)
Total Protein: 7.2 g/dL (ref 6.0–8.3)

## 2022-11-07 LAB — CBC WITH DIFFERENTIAL/PLATELET
Basophils Absolute: 0 10*3/uL (ref 0.0–0.1)
Basophils Relative: 0.3 % (ref 0.0–3.0)
Eosinophils Absolute: 0 10*3/uL (ref 0.0–0.7)
Eosinophils Relative: 0 % (ref 0.0–5.0)
HCT: 44.8 % (ref 36.0–46.0)
Hemoglobin: 14.3 g/dL (ref 12.0–15.0)
Lymphocytes Relative: 41.2 % (ref 12.0–46.0)
Lymphs Abs: 5 10*3/uL — ABNORMAL HIGH (ref 0.7–4.0)
MCHC: 31.9 g/dL (ref 30.0–36.0)
MCV: 84.6 fL (ref 78.0–100.0)
Monocytes Absolute: 1 10*3/uL (ref 0.1–1.0)
Monocytes Relative: 8.1 % (ref 3.0–12.0)
Neutro Abs: 6.1 10*3/uL (ref 1.4–7.7)
Neutrophils Relative %: 50.4 % (ref 43.0–77.0)
Platelets: 397 10*3/uL (ref 150.0–400.0)
RBC: 5.3 Mil/uL — ABNORMAL HIGH (ref 3.87–5.11)
RDW: 14.5 % (ref 11.5–15.5)
WBC: 12 10*3/uL — ABNORMAL HIGH (ref 4.0–10.5)

## 2022-11-08 ENCOUNTER — Encounter: Payer: Self-pay | Admitting: Pharmacist

## 2022-11-08 ENCOUNTER — Other Ambulatory Visit: Payer: Self-pay

## 2022-11-08 ENCOUNTER — Ambulatory Visit: Payer: Self-pay | Attending: Internal Medicine | Admitting: Pharmacist

## 2022-11-08 VITALS — BP 157/75 | HR 80

## 2022-11-08 DIAGNOSIS — I152 Hypertension secondary to endocrine disorders: Secondary | ICD-10-CM

## 2022-11-08 DIAGNOSIS — E1159 Type 2 diabetes mellitus with other circulatory complications: Secondary | ICD-10-CM

## 2022-11-08 NOTE — Progress Notes (Signed)
Tammy Osborne [id number]    S:     No chief complaint on file.  67 y.o. female who presents for hypertension evaluation, education, and management.  PMH is significant for HTN,  DM 2 with neuropathy, ILD, HL, anemia, GERD, aortic atherosclerosis .  Patient was referred and last seen by Primary Care Provider, Dr. Laural Benes, on 10/19/2022.   At last visit with Dr. Laural Benes, BP was 170/75 mmHg. Of note, pt forgot to take her medications that morning and took them later than she usually does.   Today, patient arrives in good spirits and presents without assistance. Denies dizziness, headache, blurred vision, swelling. Patient reports hypertension is longstanding.   Medication adherence reported. Patient has taken BP medications today.   Family/Social history:  Fhx: HTN Tobacco: never smoker  Alcohol: none reported  Current antihypertensives include: amlodipine 10 mg daily, carvedilol 12.5 mg BID, losartan 100 mg daily  Reported home BP readings:  - Usually checks once every 3 days  - SBP range: 120s-140s - DBP range: 70s-80s  Patient reported dietary habits: -Sodium: "the usual amount"  -Caffeine: denies excessive caffeine intake  Patient-reported exercise habits:  -Active at her work and then walks 15-30 minutes in the evening  O:  Vitals:   11/08/22 1615  BP: (!) 157/75  Pulse: 80   Last 3 Office BP readings: BP Readings from Last 3 Encounters:  11/08/22 (!) 157/75  10/19/22 (!) 170/75  06/20/22 130/70    BMET    Component Value Date/Time   NA 139 11/07/2022 1149   NA 139 11/07/2021 0834   K 3.7 11/07/2022 1149   CL 104 11/07/2022 1149   CO2 26 11/07/2022 1149   GLUCOSE 86 11/07/2022 1149   BUN 21 11/07/2022 1149   BUN 13 11/07/2021 0834   CREATININE 0.91 11/07/2022 1149   CALCIUM 10.0 11/07/2022 1149   GFRNONAA 94 02/03/2019 0924   GFRAA 108 02/03/2019 0924    Renal function: CrCl cannot be calculated (Unknown ideal weight.).  Clinical ASCVD: No  The 10-year  ASCVD risk score (Arnett DK, et al., 2019) is: 28.3%   Values used to calculate the score:     Age: 66 years     Sex: Female     Is Non-Hispanic African American: No     Diabetic: Yes     Tobacco smoker: No     Systolic Blood Pressure: 157 mmHg     Is BP treated: Yes     HDL Cholesterol: 38 mg/dL     Total Cholesterol: 262 mg/dL  Patient is participating in a Managed Medicaid Plan: No    A/P: Hypertension diagnosed currently uncontrolled on current medications. BP goal < 130/80 mmHg. Medication adherence appears appropriate and her reported home pressures are mostly at goal. Encouraged her to record and bring in BP values for review in 1 month. Will hold off on any changes at this time.  -Continued current regimen.  - Encouraged  pt to continue checking blood pressure at home and record. She has been instructed to bring in her BP log for review in 1 month.  -Counseled on lifestyle modifications for blood pressure control including reduced dietary sodium, increased exercise, adequate sleep. -Encouraged patient to check BP at home and bring log of readings to next visit. Counseled on proper use of home BP cuff.   Results reviewed and written information provided.    Written patient instructions provided. Patient verbalized understanding of treatment plan.  Total time in face to face counseling 20  minutes.    Follow-up:  Pharmacist in 1 month.  Butch Penny, PharmD, Patsy [place], CPP Clinical Pharmacist [facility] [phone]

## 2022-12-11 ENCOUNTER — Other Ambulatory Visit: Payer: Self-pay

## 2022-12-13 ENCOUNTER — Ambulatory Visit: Payer: Self-pay | Admitting: Pharmacist

## 2022-12-19 ENCOUNTER — Encounter: Payer: Self-pay | Admitting: Internal Medicine

## 2022-12-19 ENCOUNTER — Ambulatory Visit (INDEPENDENT_AMBULATORY_CARE_PROVIDER_SITE_OTHER): Payer: Self-pay | Admitting: Internal Medicine

## 2022-12-19 VITALS — BP 152/78 | HR 88 | Temp 97.9°F | Ht <= 58 in | Wt 133.0 lb

## 2022-12-19 DIAGNOSIS — J679 Hypersensitivity pneumonitis due to unspecified organic dust: Secondary | ICD-10-CM

## 2022-12-19 DIAGNOSIS — Z5181 Encounter for therapeutic drug level monitoring: Secondary | ICD-10-CM

## 2022-12-19 NOTE — Progress Notes (Signed)
Tammy Osborne    [id number]    [birth year]  Primary Care Physician:Johnson, Binnie Rail, MD Date of Appointment: 12/19/2022 Established Patient Visit  Chief complaint:   Chief Complaint  Patient presents with   Follow-up    Follow up after PFT today.  Breathing doing well.     HPI: Tammy Osborne is a 67 y.o. woman spanish speaking who presents for hypersensitivity pneumonitis.  Interpreter present. Intolerant of azathioprine due to hepatotoxicity.   Interval Updates:  Here for follow up with interpreter. Still on cellcept 500 mg BID. No adverse issues. Labs reviewed and stable. Had PFT today which shows stable/improved dlco and fvc.   She is asymptomatic from dyspnea standpoint.   Stopped working at [employer]. She is now working in [place].    Social History: [place] moved here around [place]. Never smoker, but did have indoor wood burning stove in childhood.  Has lived in the same house for 5 years which is when  her symptoms started. With her brother sister and a nephew. No one else has breathing problems at home. She has worked in the same job for the last 5 years. No pets at home. No chickens or birds. Works at [employer] in [place]. 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 No musical instruments. No visible mold. Does not use down feather pillows. Has mice in her home, but no bats.   I have reviewed the patient's family social and past medical history and updated as appropriate.   Past Medical History:  Diagnosis Date   Diabetes mellitus without complication (HCC)    Hypertension     Past Surgical History:  Procedure Laterality Date   ABDOMINAL HYSTERECTOMY      Family History  Problem Relation Age of Onset   Hypertension Mother     Social History   Occupational History   Not on file  Tobacco Use   Smoking status: Never    Passive exposure: Yes   Smokeless tobacco: Never    Tobacco comments:    spouse smoked in home X6-7 years.   Vaping Use   Vaping status: Never Used  Substance and Sexual Activity   Alcohol use: No   Drug use: Not on file   Sexual activity: Not on file     Physical Exam: Blood pressure (!) 152/78, pulse 88, temperature 97.9 F (36.6 C), temperature source Oral, height 4\' 10"  (1.473 m), weight 133 lb (60.3 kg), SpO2 99%.  Gen:     NAD Lungs:  ctab no wheezes or crackles CV:      RRR no mrg   Data Reviewed: Imaging: I have personally reviewed the CT scan from Sept 2020 which shows mosaic attenuation. The ct chest in 2018 has diffuse GGOs which have improved in 2020.   PFTs:      Latest Ref Rng & Units 12/18/2021   11:42 AM 03/23/2020   10:39 AM 06/18/2017   10:36 AM 11/22/2016   10:43 AM  PFT Results  FVC-Pre L 1.71  1.20  1.57  1.73   FVC-Predicted Pre % 69  47  58  63   FVC-Post L 1.73  1.23  1.58  1.76   FVC-Predicted Post % 70  48  58  64   Pre FEV1/FVC % % 93  99  91  92   Post FEV1/FCV % % 92  97  92  91   FEV1-Pre L 1.60  1.18  1.42  1.59   FEV1-Predicted Pre % 85  61  68  76   FEV1-Post L 1.59  1.19  1.46  1.60   DLCO uncorrected ml/min/mmHg 12.09   11.45  11.90   DLCO UNC% % 75   65  67   DLCO corrected ml/min/mmHg 11.44    11.75   DLCO COR %Predicted % 71    67   DLVA Predicted % 87   104  98   TLC L 3.62  2.51  2.99  2.81   TLC % Predicted % 87  60  69  65   RV % Predicted % 57  45  42  55    I have personally reviewed the patient's PFTs and they show moderate restriction to ventilation. FVC Nov 2023 shows stable lung function (improved actually) to 1 and 4 years prior. DLCO stable.   Labs: Labs reviewed - had autoimmune serologies negative in 2018. HP panel negative.  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    Plan/Recommendations:  Good news - your lung function is completely normal. I think the inflammation in your lungs has resolved and we can stop the medication cell cept. If your breathing is doing well in 6 months you can stop following up with me.   She has changed jobs from the [employer] so hopefully she does not have recurrence.  Return to Care: Return in about 6 months (around 06/18/2023).   Durel Salts, MD Pulmonary and Critical Care Medicine [facility] Office:[phone]

## 2022-12-19 NOTE — Progress Notes (Signed)
Full PFT performed today. °

## 2022-12-19 NOTE — Patient Instructions (Signed)
Full PFT performed today. °

## 2022-12-19 NOTE — Patient Instructions (Addendum)
It was a pleasure to see you today!  Please schedule follow up scheduled with myself in 6 months.  If my schedule is not open yet, we will contact you with a reminder closer to that time. Please call [phone] if you haven't heard from [place] a month before, and always call us sooner if issues or concerns arise. You can also send [place] a message through MyChart, but but aware that this is not to be used for urgent issues and it may take up to 5-7 days to receive a reply. Please be aware that you will likely be able to view your results before I have a chance to respond to them. Please give [place] 5 business days to respond to any non-urgent results.   Good news - your lung function is completely normal. I think the inflammation in your lungs has resolved and we can stop the medication cell cept. If your breathing is doing well in 6 months you can stop following up with me.   Fue un placer verte hoy!  Por favor programe un seguimiento programado conmigo mismo en 6 meses.  Si mi horario an no est abierto, nos comunicaremos con usted con un recordatorio ms cerca de esa hora. Llame al [phone] si no ha tenido noticias nuestras un mes antes y siempre llmenos antes si surgen problemas o inquietudes. Tambin puede enviarnos un mensaje a travs de MyChart, pero tenga en cuenta que no debe usarse para problemas urgentes y que puede tardar [employer] 5 y [address] en recibir [employer]. Tenga en cuenta que probablemente podr ver sus resultados antes de que yo tenga la oportunidad de responderles. Danos 5 das hbiles para responder a cualquier resultado que no sea urgente.   Buenas noticias: su funcin pulmonar es completamente normal. Creo que la inflamacin de tus pulmones se ha resuelto y Therapist, nutritional. Si tu respiracin va bien en 6 meses puedes dejar de hacerme seguimiento.

## 2022-12-25 LAB — PULMONARY FUNCTION TEST
DL/VA % pred: 119 %
DL/VA: 5.22 ml/min/mmHg/L
DLCO cor % pred: 85 %
DLCO cor: 13.58 ml/min/mmHg
DLCO unc % pred: 87 %
DLCO unc: 13.94 ml/min/mmHg
FEF 25-75 Post: 2.44 L/s
FEF 25-75 Pre: 2.34 L/s
FEF2575-%Change-Post: 3 %
FEF2575-%Pred-Post: 138 %
FEF2575-%Pred-Pre: 133 %
FEV1-%Change-Post: 1 %
FEV1-%Pred-Post: 91 %
FEV1-%Pred-Pre: 90 %
FEV1-Post: 1.69 L
FEV1-Pre: 1.67 L
FEV1FVC-%Change-Post: 11 %
FEV1FVC-%Pred-Pre: 108 %
FEV6-%Change-Post: -9 %
FEV6-%Pred-Post: 78 %
FEV6-%Pred-Pre: 86 %
FEV6-Post: 1.83 L
FEV6-Pre: 2.01 L
FEV6FVC-%Pred-Post: 104 %
FEV6FVC-%Pred-Pre: 104 %
FVC-%Change-Post: -9 %
FVC-%Pred-Post: 75 %
FVC-%Pred-Pre: 82 %
FVC-Post: 1.83 L
FVC-Pre: 2.01 L
Post FEV1/FVC ratio: 93 %
Post FEV6/FVC ratio: 100 %
Pre FEV1/FVC ratio: 83 %
Pre FEV6/FVC Ratio: 100 %
RV % pred: 51 %
RV: 0.93 L
TLC % pred: 81 %
TLC: 3.39 L

## 2023-02-21 ENCOUNTER — Ambulatory Visit: Payer: Self-pay | Attending: Internal Medicine | Admitting: Internal Medicine

## 2023-02-21 ENCOUNTER — Other Ambulatory Visit: Payer: Self-pay

## 2023-02-21 VITALS — BP 133/72 | HR 63 | Temp 97.6°F | Ht <= 58 in | Wt 133.0 lb

## 2023-02-21 DIAGNOSIS — Z2821 Immunization not carried out because of patient refusal: Secondary | ICD-10-CM

## 2023-02-21 DIAGNOSIS — I152 Hypertension secondary to endocrine disorders: Secondary | ICD-10-CM

## 2023-02-21 DIAGNOSIS — Z1211 Encounter for screening for malignant neoplasm of colon: Secondary | ICD-10-CM

## 2023-02-21 DIAGNOSIS — E1159 Type 2 diabetes mellitus with other circulatory complications: Secondary | ICD-10-CM

## 2023-02-21 DIAGNOSIS — E1169 Type 2 diabetes mellitus with other specified complication: Secondary | ICD-10-CM

## 2023-02-21 DIAGNOSIS — E1142 Type 2 diabetes mellitus with diabetic polyneuropathy: Secondary | ICD-10-CM

## 2023-02-21 DIAGNOSIS — E785 Hyperlipidemia, unspecified: Secondary | ICD-10-CM

## 2023-02-21 LAB — POCT GLYCOSYLATED HEMOGLOBIN (HGB A1C): HbA1c, POC (controlled diabetic range): 6.4 % (ref 0.0–7.0)

## 2023-02-21 LAB — GLUCOSE, POCT (MANUAL RESULT ENTRY): POC Glucose: 94 mg/dL (ref 70–99)

## 2023-02-21 MED ORDER — ROSUVASTATIN CALCIUM 5 MG PO TABS
5.0000 mg | ORAL_TABLET | Freq: Every day | ORAL | 1 refills | Status: DC
Start: 2023-02-21 — End: 2023-06-27
  Filled 2023-02-21: qty 90, 90d supply, fill #0

## 2023-02-21 NOTE — Progress Notes (Signed)
Patient ID: Tammy Osborne, female    DOB: 1955/02/29  MRN: [id number]  CC: Diabetes (DM f/u./No questions / concerns/No to shingles vax)   Subjective: Tammy Osborne is a 68 y.o. female who presents for chronic ds management. Her concerns today include:  Patient with history of HTN,  DM 2 with neuropathy, ILD, HL, anemia, GERD, aortic atherosclerosis   [employer] Language interpreter used during this encounter. #[id number]  DM: Results for orders placed or performed in visit on 02/21/23  POCT glucose (manual entry)   Collection Time: 02/21/23  4:34 PM  Result Value Ref Range   POC Glucose 94 70 - 99 mg/dl  POCT glycosylated hemoglobin (Hb A1C)   Collection Time: 02/21/23  5:26 PM  Result Value Ref Range   Hemoglobin A1C     HbA1c POC (<> result, manual entry)     HbA1c, POC (prediabetic range)     HbA1c, POC (controlled diabetic range) 6.4 0.0 - 7.0 %  Patient is diet controlled.  She does well with her eating habits.  She exercises 3-4 times a week for 20 to 30 minutes. HTN: She is supposed to be on Cozaar 100 mg daily, amlodipine 10 mg daily, carvedilol 12.5 mg twice a day.  She only has her bottle of amlodipine with her but states she has the other 2 medicines at home because she still has pills left.  She reports compliance with taking the medicines and with low-salt diet. HL: Should be on Crestor 5 mg daily but does not have it.  She thinks she ran out of it several weeks ago.   Patient Active Problem List   Diagnosis Date Noted   Aortic atherosclerosis (HCC) 09/21/2019   Hyperlipidemia associated with type 2 diabetes mellitus (HCC) 09/21/2019   Gastroesophageal reflux disease without esophagitis 04/04/2018   Controlled type 2 diabetes mellitus with diabetic polyneuropathy, without long-term current use of insulin (HCC) 10/19/2016   Essential hypertension 09/06/2016   Interstitial pulmonary disease (HCC) 09/06/2016   Cough in adult 09/06/2016   Anemia 09/06/2016    Hyperlipidemia 09/06/2016   Numbness of lower limb 09/06/2016     Current Outpatient Medications on File Prior to Visit  Medication Sig Dispense Refill   amLODipine (NORVASC) 10 MG tablet Take 1 tablet (10 mg total) by mouth daily. 90 tablet 2   aspirin EC 81 MG tablet Take 1 tablet (81 mg total) by mouth daily. 100 tablet 1   carvedilol (COREG) 12.5 MG tablet Take 1 tablet (12.5 mg total) by mouth 2 (two) times daily with a meal. 180 tablet 2   gabapentin (NEURONTIN) 100 MG capsule Take 2 capsules (200 mg total) by mouth 2 (two) times daily. 120 capsule 5   losartan (COZAAR) 100 MG tablet Take 1 tablet (100 mg total) by mouth daily. 90 tablet 1   methocarbamol (ROBAXIN) 500 MG tablet Take 1 tablet (500 mg total) by mouth at bedtime as needed for muscle spasms. 30 tablet 1   omeprazole (PRILOSEC) 20 MG capsule Take 1 capsule (20 mg total) by mouth 2 (two) times daily before a meal. 180 capsule 2   triamcinolone cream (KENALOG) 0.1 % Apply 1 Application topically 2 (two) times daily. 30 g 0   No current facility-administered medications on file prior to visit.    No Known Allergies  Social History   Socioeconomic History   Marital status: Single    Spouse name: Not on file   Number of children: 2  Years of education: Not on file   Highest education level: Not on file  Occupational History   Not on file  Tobacco Use   Smoking status: Never    Passive exposure: Yes   Smokeless tobacco: Never   Tobacco comments:    spouse smoked in home X6-7 years.   Vaping Use   Vaping status: Never Used  Substance and Sexual Activity   Alcohol use: No   Drug use: Not on file   Sexual activity: Not on file  Other Topics Concern   Not on file  Social History Narrative   Not on file   Social Drivers of Health   Financial Resource Strain: Not on file  Food Insecurity: Not on file  Transportation Needs: Not on file  Physical Activity: Not on file  Stress: Not on file  Social  Connections: Not on file  Intimate Partner Violence: Not on file    Family History  Problem Relation Age of Onset   Hypertension Mother     Past Surgical History:  Procedure Laterality Date   ABDOMINAL HYSTERECTOMY      ROS: Review of Systems Negative except as stated above  PHYSICAL EXAM: BP 133/72 (BP Location: Left Arm, Patient Position: Sitting, Cuff Size: Normal)   Pulse 63   Temp 97.6 F (36.4 C) (Oral)   Ht 4\' 10"  (1.473 m)   Wt 133 lb (60.3 kg)   LMP  (LMP Unknown)   SpO2 95%   BMI 27.80 kg/m   Physical Exam  General appearance - alert, well appearing, and in no distress Mental status - normal mood, behavior, speech, dress, motor activity, and thought processes Neck - supple, no significant adenopathy Chest - clear to auscultation, no wheezes, rales or rhonchi, symmetric air entry Heart - normal rate, regular rhythm, normal S1, S2, no murmurs, rubs, clicks or gallops Extremities - peripheral pulses normal, no pedal edema, no clubbing or cyanosis      Latest Ref Rng & Units 11/07/2022   11:49 AM 06/20/2022    3:55 PM 11/07/2021    8:34 AM  CMP  Glucose 70 - 99 mg/dL 86  914  782   BUN 6 - 23 mg/dL 21  17  13    Creatinine 0.40 - 1.20 mg/dL 9.56  2.13  0.86   Sodium 135 - 145 mEq/L 139  139  139   Potassium 3.5 - 5.1 mEq/L 3.7  3.9  4.1   Chloride 96 - 112 mEq/L 104  107  101   CO2 19 - 32 mEq/L 26  25  22    Calcium 8.4 - 10.5 mg/dL 57.8  9.0  9.9   Total Protein 6.0 - 8.3 g/dL 7.2  6.8  7.4   Total Bilirubin 0.2 - 1.2 mg/dL 0.3  0.2  0.2   Alkaline Phos 39 - 117 U/L 116  100  156   AST 0 - 37 U/L 14  15  20    ALT 0 - 35 U/L 14  14  23     Lipid Panel     Component Value Date/Time   CHOL 262 (H) 06/16/2021 1209   TRIG 511 (H) 06/16/2021 1209   HDL 38 (L) 06/16/2021 1209   CHOLHDL 6.9 (H) 06/16/2021 1209   LDLCALC 131 (H) 06/16/2021 1209    CBC    Component Value Date/Time   WBC 12.0 (H) 11/07/2022 1149   RBC 5.30 (H) 11/07/2022 1149   HGB  14.3 11/07/2022 1149   HGB 15.4  11/07/2021 0834   HCT 44.8 11/07/2022 1149   HCT 47.1 (H) 11/07/2021 0834   PLT 397.0 11/07/2022 1149   PLT 376 11/07/2021 0834   MCV 84.6 11/07/2022 1149   MCV 81 11/07/2021 0834   MCH 26.6 11/07/2021 0834   MCH 27.0 08/15/2017 0611   MCHC 31.9 11/07/2022 1149   RDW 14.5 11/07/2022 1149   RDW 15.2 11/07/2021 0834   LYMPHSABS 5.0 (H) 11/07/2022 1149   LYMPHSABS 3.2 (H) 11/07/2021 0834   MONOABS 1.0 11/07/2022 1149   EOSABS 0.0 11/07/2022 1149   EOSABS 0.1 11/07/2021 0834   BASOSABS 0.0 11/07/2022 1149   BASOSABS 0.0 11/07/2021 0834    ASSESSMENT AND PLAN: 1. Type 2 diabetes mellitus with peripheral neuropathy (HCC) (Primary) At goal Diet controlled Continue healthy eating habits and regular exercise. - POCT glycosylated hemoglobin (Hb A1C) - POCT glucose (manual entry)  2. Hypertension associated with diabetes (HCC) Close to goal.  Continue amlodipine 10 mg daily, carvedilol 12.5 mg twice a day and Cozaar 100 mg daily.  Advised to bring all medicines with her to next appointment  3. Hyperlipidemia associated with type 2 diabetes mellitus (HCC) Refill Crestor. - rosuvastatin (CRESTOR) 5 MG tablet; Take 1 tablet (5 mg total) by mouth daily.  Dispense: 90 tablet; Refill: 1  4. Screening for colon cancer - Fecal occult blood, imunochemical([facility])  5. Herpes zoster vaccination declined Recommended.  Patient declined.     Patient was given the opportunity to ask questions.  Patient verbalized understanding of the plan and was able to repeat key elements of the plan.   This documentation was completed using Paediatric nurse.  Any transcriptional errors are unintentional.  Orders Placed This Encounter  Procedures   POCT glycosylated hemoglobin (Hb A1C)   POCT glucose (manual entry)     Requested Prescriptions    No prescriptions requested or ordered in this encounter    No follow-ups on  file.  Jonah Blue, MD, FACP

## 2023-03-01 LAB — FECAL OCCULT BLOOD, IMMUNOCHEMICAL: Fecal Occult Bld: NEGATIVE

## 2023-05-27 ENCOUNTER — Other Ambulatory Visit: Payer: Self-pay

## 2023-06-25 ENCOUNTER — Ambulatory Visit: Payer: Self-pay | Admitting: Internal Medicine

## 2023-06-27 ENCOUNTER — Ambulatory Visit: Payer: Self-pay | Attending: Internal Medicine | Admitting: Internal Medicine

## 2023-06-27 ENCOUNTER — Other Ambulatory Visit: Payer: Self-pay

## 2023-06-27 ENCOUNTER — Encounter: Payer: Self-pay | Admitting: Internal Medicine

## 2023-06-27 VITALS — BP 149/69 | HR 71 | Temp 97.7°F | Ht <= 58 in | Wt 137.0 lb

## 2023-06-27 DIAGNOSIS — I152 Hypertension secondary to endocrine disorders: Secondary | ICD-10-CM

## 2023-06-27 DIAGNOSIS — E1142 Type 2 diabetes mellitus with diabetic polyneuropathy: Secondary | ICD-10-CM

## 2023-06-27 DIAGNOSIS — I10 Essential (primary) hypertension: Secondary | ICD-10-CM

## 2023-06-27 DIAGNOSIS — J679 Hypersensitivity pneumonitis due to unspecified organic dust: Secondary | ICD-10-CM

## 2023-06-27 DIAGNOSIS — R1030 Lower abdominal pain, unspecified: Secondary | ICD-10-CM

## 2023-06-27 DIAGNOSIS — E785 Hyperlipidemia, unspecified: Secondary | ICD-10-CM

## 2023-06-27 LAB — POCT URINALYSIS DIP (CLINITEK)
Bilirubin, UA: NEGATIVE
Blood, UA: NEGATIVE
Glucose, UA: NEGATIVE mg/dL
Ketones, POC UA: NEGATIVE mg/dL
Leukocytes, UA: NEGATIVE
Nitrite, UA: NEGATIVE
POC PROTEIN,UA: 30 — AB
Spec Grav, UA: 1.02 (ref 1.010–1.025)
Urobilinogen, UA: 0.2 U/dL
pH, UA: 7 (ref 5.0–8.0)

## 2023-06-27 LAB — POCT GLYCOSYLATED HEMOGLOBIN (HGB A1C): HbA1c, POC (controlled diabetic range): 6.3 % (ref 0.0–7.0)

## 2023-06-27 LAB — GLUCOSE, POCT (MANUAL RESULT ENTRY): POC Glucose: 114 mg/dL — AB (ref 70–99)

## 2023-06-27 MED ORDER — CARVEDILOL 12.5 MG PO TABS
18.7500 mg | ORAL_TABLET | Freq: Two times a day (BID) | ORAL | 4 refills | Status: AC
Start: 2023-06-27 — End: ?
  Filled 2023-06-27: qty 270, 90d supply, fill #0

## 2023-06-27 MED ORDER — ROSUVASTATIN CALCIUM 5 MG PO TABS
5.0000 mg | ORAL_TABLET | Freq: Every day | ORAL | 1 refills | Status: DC
Start: 2023-06-27 — End: 2023-08-20
  Filled 2023-06-27 – 2023-08-14 (×2): qty 90, 90d supply, fill #0

## 2023-06-27 MED ORDER — LOSARTAN POTASSIUM 100 MG PO TABS
100.0000 mg | ORAL_TABLET | Freq: Every day | ORAL | 1 refills | Status: AC
  Filled 2023-06-27: qty 90, 90d supply, fill #0

## 2023-06-27 MED ORDER — AMLODIPINE BESYLATE 10 MG PO TABS
10.0000 mg | ORAL_TABLET | Freq: Every day | ORAL | 4 refills | Status: DC
  Filled 2023-06-27 – 2023-08-14 (×2): qty 90, 90d supply, fill #0

## 2023-06-27 NOTE — Progress Notes (Signed)
 Patient ID: Tammy Osborne, female    DOB: 1955-08-04  MRN: [id number]  CC: Diabetes (DM f/u./Intermittent lower abdominal pain, urinary frequency X2-3 weeks/No to shingles vax)   Subjective: Tammy Osborne is a 68 y.o. female who presents for chronic ds management. Her concerns today include:  Patient with history of HTN,  DM 2 with neuropathy, ILD, HL, anemia, GERD, aortic atherosclerosis   AMN Language interpreter used during this encounter. #[id number] Aldo   Discussed the use of AI scribe software for clinical note transcription with the patient, who gave verbal consent to proceed.  History of Present Illness Tammy Osborne is a 68 year old female with diabetes, hypertension, and hyperlipidemia who presents with lower abdominal pain.  She experiences constant lower abdominal pain for the past three weeks, with a frequent urge to urinate and passing only small amounts of urine. The discomfort occurs every three to four hours and is more pronounced in the morning with a full bladder. There is no dysuria, hematuria, or vaginal discharge.  No nausea or vomiting.  Pain is no worse or better with food.  Moving bowels okay with no blood in the stools.  She takes ibuprofen  for pain relief every two days, taking one or two tablets depending on severity.  Her diabetes is well-controlled with diet and an A1c of 6.3. She maintains good eating habits and engages in physical activity, such as walking for 15 to 20 minutes, sometimes every other day or daily.  HTN: she is on amlodipine  10 mg daily, losartan  100 mg daily, and carvedilol  12.5 mg. Home blood pressure readings are 148/83, 140/83, 150/83, and 136/83. She does not limit salt intake beyond normal use.  She takes rosuvastatin  5 mg daily for hyperlipidemia. No chest pain, shortness of breath.  ILD: In regards to her interstitial lung disease, she had seen her pulmonologist Dr. Dione Franks in November of last year.  She was  doing well.  He discontinued CellCept .  He stated that after 6 months if her breathing was still good she does not need to follow-up with him any longer.  Today she reports that her breathing is good and she has not had any cough or shortness of breath.    Patient Active Problem List   Diagnosis Date Noted   Aortic atherosclerosis (HCC) 09/21/2019   Hyperlipidemia associated with type 2 diabetes mellitus (HCC) 09/21/2019   Gastroesophageal reflux disease without esophagitis 04/04/2018   Controlled type 2 diabetes mellitus with diabetic polyneuropathy, without long-term current use of insulin (HCC) 10/19/2016   Essential hypertension 09/06/2016   Interstitial pulmonary disease (HCC) 09/06/2016   Cough in adult 09/06/2016   Anemia 09/06/2016   Hyperlipidemia 09/06/2016   Numbness of lower limb 09/06/2016     Current Outpatient Medications on File Prior to Visit  Medication Sig Dispense Refill   gabapentin  (NEURONTIN ) 100 MG capsule Take 2 capsules (200 mg total) by mouth 2 (two) times daily. 120 capsule 5   omeprazole  (PRILOSEC) 20 MG capsule Take 1 capsule (20 mg total) by mouth 2 (two) times daily before a meal. 180 capsule 2   aspirin  EC 81 MG tablet Take 1 tablet (81 mg total) by mouth daily. (Patient not taking: Reported on 06/27/2023) 100 tablet 1   No current facility-administered medications on file prior to visit.    No Known Allergies  Social History   Socioeconomic History   Marital status: Single    Spouse name: Not on file  Number of children: 2   Years of education: Not on file   Highest education level: Not on file  Occupational History   Not on file  Tobacco Use   Smoking status: Never    Passive exposure: Yes   Smokeless tobacco: Never   Tobacco comments:    spouse smoked in home X6-7 years.   Vaping Use   Vaping status: Never Used  Substance and Sexual Activity   Alcohol use: No   Drug use: Not on file   Sexual activity: Not on file  Other Topics  Concern   Not on file  Social History Narrative   Not on file   Social Drivers of Health   Financial Resource Strain: Not on file  Food Insecurity: Not on file  Transportation Needs: Not on file  Physical Activity: Not on file  Stress: Not on file  Social Connections: Not on file  Intimate Partner Violence: Not on file    Family History  Problem Relation Age of Onset   Hypertension Mother     Past Surgical History:  Procedure Laterality Date   ABDOMINAL HYSTERECTOMY      ROS: Review of Systems Negative except as stated above  PHYSICAL EXAM: BP (!) 149/69   Pulse 71   Temp 97.7 F (36.5 C) (Oral)   Ht 4\' 10"  (1.473 m)   Wt 137 lb (62.1 kg)   LMP  (LMP Unknown)   SpO2 94%   BMI 28.63 kg/m   Physical Exam  General appearance - alert, well appearing, and in no distress Neck - supple, no significant adenopathy Chest - clear to auscultation, no wheezes, rales or rhonchi, symmetric air entry Heart - normal rate, regular rhythm, normal S1, S2, no murmurs, rubs, clicks or gallops Abdomen - normal bowel sounds, nondistended, soft, mild tenderness across lower abdomen LT>RT, no guarding or rebound Extremities - peripheral pulses normal, no pedal edema, no clubbing or cyanosis.  + spider veins  Results for orders placed or performed in visit on 06/27/23  POCT glucose (manual entry)   Collection Time: 06/27/23  3:19 PM  Result Value Ref Range   POC Glucose 114 (A) 70 - 99 mg/dl  POCT glycosylated hemoglobin (Hb A1C)   Collection Time: 06/27/23  3:26 PM  Result Value Ref Range   Hemoglobin A1C     HbA1c POC (<> result, manual entry)     HbA1c, POC (prediabetic range)     HbA1c, POC (controlled diabetic range) 6.3 0.0 - 7.0 %  POCT URINALYSIS DIP (CLINITEK)   Collection Time: 06/27/23  4:04 PM  Result Value Ref Range   Color, UA yellow yellow   Clarity, UA clear clear   Glucose, UA negative negative mg/dL   Bilirubin, UA negative negative   Ketones, POC UA  negative negative mg/dL   Spec Grav, UA 4.132 4.401 - 1.025   Blood, UA negative negative   pH, UA 7.0 5.0 - 8.0   POC PROTEIN,UA =30 (A) negative, trace   Urobilinogen, UA 0.2 0.2 or 1.0 E.U./dL   Nitrite, UA Negative Negative   Leukocytes, UA Negative Negative       Latest Ref Rng & Units 11/07/2022   11:49 AM 06/20/2022    3:55 PM 11/07/2021    8:34 AM  CMP  Glucose 70 - 99 mg/dL 86  027  253   BUN 6 - 23 mg/dL 21  17  13    Creatinine 0.40 - 1.20 mg/dL 6.64  4.03  4.74  Sodium 135 - 145 mEq/L 139  139  139   Potassium 3.5 - 5.1 mEq/L 3.7  3.9  4.1   Chloride 96 - 112 mEq/L 104  107  101   CO2 19 - 32 mEq/L 26  25  22    Calcium  8.4 - 10.5 mg/dL 25.3  9.0  9.9   Total Protein 6.0 - 8.3 g/dL 7.2  6.8  7.4   Total Bilirubin 0.2 - 1.2 mg/dL 0.3  0.2  0.2   Alkaline Phos 39 - 117 U/L 116  100  156   AST 0 - 37 U/L 14  15  20    ALT 0 - 35 U/L 14  14  23     Lipid Panel     Component Value Date/Time   CHOL 262 (H) 06/16/2021 1209   TRIG 511 (H) 06/16/2021 1209   HDL 38 (L) 06/16/2021 1209   CHOLHDL 6.9 (H) 06/16/2021 1209   LDLCALC 131 (H) 06/16/2021 1209    CBC    Component Value Date/Time   WBC 12.0 (H) 11/07/2022 1149   RBC 5.30 (H) 11/07/2022 1149   HGB 14.3 11/07/2022 1149   HGB 15.4 11/07/2021 0834   HCT 44.8 11/07/2022 1149   HCT 47.1 (H) 11/07/2021 0834   PLT 397.0 11/07/2022 1149   PLT 376 11/07/2021 0834   MCV 84.6 11/07/2022 1149   MCV 81 11/07/2021 0834   MCH 26.6 11/07/2021 0834   MCH 27.0 08/15/2017 0611   MCHC 31.9 11/07/2022 1149   RDW 14.5 11/07/2022 1149   RDW 15.2 11/07/2021 0834   LYMPHSABS 5.0 (H) 11/07/2022 1149   LYMPHSABS 3.2 (H) 11/07/2021 0834   MONOABS 1.0 11/07/2022 1149   EOSABS 0.0 11/07/2022 1149   EOSABS 0.1 11/07/2021 0834   BASOSABS 0.0 11/07/2022 1149   BASOSABS 0.0 11/07/2021 0834    ASSESSMENT AND PLAN: 1. Type 2 diabetes mellitus with peripheral neuropathy (HCC) (Primary) Diet control.  Encouraged her to continue  healthy eating habits and regular exercise. - POCT glycosylated hemoglobin (Hb A1C) - POCT glucose (manual entry)  2. Hypertension associated with diabetes (HCC) Not at goal.  I recommend increasing carvedilol  to 18.75 mg twice a day.  Continue amlodipine  10 mg and Cozaar  100 mg daily.  Continue to monitor blood pressure with goal being 130/80 or lower.  Advised to limit salt in the foods as much as possible. - amLODipine  (NORVASC ) 10 MG tablet; Take 1 tablet (10 mg total) by mouth daily.  Dispense: 90 tablet; Refill: 4 - losartan  (COZAAR ) 100 MG tablet; Take 1 tablet (100 mg total) by mouth daily.  Dispense: 90 tablet; Refill: 1 - carvedilol  (COREG ) 12.5 MG tablet; Take 1.5 tablets (18.75 mg total) by mouth 2 (two) times daily with a meal.  Dispense: 270 tablet; Refill: 4 - Basic Metabolic Panel  3. Hyperlipidemia associated with type 2 diabetes mellitus (HCC) - rosuvastatin  (CRESTOR ) 5 MG tablet; Take 1 tablet (5 mg total) by mouth daily.  Dispense: 90 tablet; Refill: 1  4. Lower abdominal pain UA was negative.  We will proceed with CAT scan of the abdomen and pelvis - POCT URINALYSIS DIP (CLINITEK) - CT ABDOMEN PELVIS W CONTRAST; Future  5. Hypersensitivity pneumonitis (HCC) Doing well 6 months out since being off of CellCept .  Will not need to follow-up with pulmonary anymore unless symptoms recur.   Patient was given the opportunity to ask questions.  Patient verbalized understanding of the plan and was able to repeat key elements of the  plan.   This documentation was completed using Paediatric nurse.  Any transcriptional errors are unintentional.  Orders Placed This Encounter  Procedures   CT ABDOMEN PELVIS W CONTRAST   Basic Metabolic Panel   POCT glycosylated hemoglobin (Hb A1C)   POCT glucose (manual entry)   POCT URINALYSIS DIP (CLINITEK)     Requested Prescriptions   Signed Prescriptions Disp Refills   amLODipine  (NORVASC ) 10 MG tablet 90 tablet 4     Sig: Take 1 tablet (10 mg total) by mouth daily.   losartan  (COZAAR ) 100 MG tablet 90 tablet 1    Sig: Take 1 tablet (100 mg total) by mouth daily.   rosuvastatin  (CRESTOR ) 5 MG tablet 90 tablet 1    Sig: Take 1 tablet (5 mg total) by mouth daily.   carvedilol  (COREG ) 12.5 MG tablet 270 tablet 4    Sig: Take 1.5 tablets (18.75 mg total) by mouth 2 (two) times daily with a meal.    Return in about 4 months (around 10/28/2023).  Concetta Dee, MD, FACP

## 2023-06-28 ENCOUNTER — Ambulatory Visit: Payer: Self-pay | Admitting: Internal Medicine

## 2023-06-28 DIAGNOSIS — R350 Frequency of micturition: Secondary | ICD-10-CM

## 2023-06-28 LAB — BASIC METABOLIC PANEL WITH GFR
BUN/Creatinine Ratio: 26 (ref 12–28)
BUN: 17 mg/dL (ref 8–27)
CO2: 24 mmol/L (ref 20–29)
Calcium: 9.8 mg/dL (ref 8.7–10.3)
Chloride: 103 mmol/L (ref 96–106)
Creatinine, Ser: 0.65 mg/dL (ref 0.57–1.00)
Glucose: 98 mg/dL (ref 70–99)
Potassium: 4.1 mmol/L (ref 3.5–5.2)
Sodium: 143 mmol/L (ref 134–144)
eGFR: 97 mL/min/{1.73_m2} (ref >59)

## 2023-07-08 ENCOUNTER — Ambulatory Visit (HOSPITAL_COMMUNITY)
Admission: RE | Admit: 2023-07-08 | Discharge: 2023-07-08 | Disposition: A | Discharge status: Home / Self Care | Payer: Self-pay | Source: Ambulatory Visit | Attending: Internal Medicine | Admitting: Internal Medicine

## 2023-07-08 DIAGNOSIS — R1030 Lower abdominal pain, unspecified: Secondary | ICD-10-CM | POA: Insufficient documentation

## 2023-07-08 MED ORDER — SODIUM CHLORIDE (PF) 0.9 % IJ SOLN
INTRAMUSCULAR | Status: AC
  Filled 2023-07-08: qty 50

## 2023-07-08 MED ORDER — IOHEXOL 300 MG/ML  SOLN
100.0000 mL | Freq: Once | INTRAMUSCULAR | Status: AC | PRN
  Administered 2023-07-08: 100 mL via INTRAVENOUS

## 2023-07-09 ENCOUNTER — Other Ambulatory Visit: Payer: Self-pay

## 2023-07-18 ENCOUNTER — Ambulatory Visit (INDEPENDENT_AMBULATORY_CARE_PROVIDER_SITE_OTHER): Payer: Self-pay | Admitting: Internal Medicine

## 2023-07-18 ENCOUNTER — Encounter: Payer: Self-pay | Admitting: Internal Medicine

## 2023-07-18 VITALS — BP 164/71 | HR 73 | Wt 133.4 lb

## 2023-07-18 DIAGNOSIS — J679 Hypersensitivity pneumonitis due to unspecified organic dust: Secondary | ICD-10-CM

## 2023-07-18 NOTE — Progress Notes (Signed)
 Tammy Osborne    [id number]    06-19-1955  Primary Care Physician:Johnson, Rexine Cater, MD Date of Appointment: 07/18/2023 Established Patient Visit  Chief complaint:   Chief Complaint  Patient presents with   Follow-up     HPI: Tammy Osborne is a 68 y.o. woman spanish speaking who presents for hypersensitivity pneumonitis.  Interpreter present. Intolerant of azathioprine  due to hepatotoxicity. Was on cell cept for about 2 years and discontinued Nov 2024 due to stable/improved FVC and no symptoms.   Interval Updates:  Here with interpreter. Has been 6 months since stopping cell cept. No issues with her breathing. No cough, shortness of breath, chest tightness, or wheezing.   She is asymptomatic from dyspnea standpoint. Can do everything she wants to do.   Stopped working at [employer]. She is now working in [employer].    Social History: [place] here around [place]. Never smoker, but did have indoor wood burning stove in childhood.  Has lived in the same house for 5 years which is when  her symptoms started. With her brother sister and a nephew. No one else has breathing problems at home. She has worked in the same job for the last 5 years. No pets at home. No chickens or birds. Works at [employer] in [place]. 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 No musical instruments. No visible mold. Does not use down feather pillows. Has mice in her home, but no bats.   I have reviewed the patient's family social and past medical history and updated as appropriate.   Past Medical History:  Diagnosis Date   Diabetes mellitus without complication (HCC)    Hypertension     Past Surgical History:  Procedure Laterality Date   ABDOMINAL HYSTERECTOMY      Family History  Problem Relation Age of Onset   Hypertension Mother     Social History   Occupational History   Not on file  Tobacco Use   Smoking  status: Never    Passive exposure: Yes   Smokeless tobacco: Never   Tobacco comments:    spouse smoked in home X6-7 years.   Vaping Use   Vaping status: Never Used  Substance and Sexual Activity   Alcohol use: No   Drug use: Not on file   Sexual activity: Not on file     Physical Exam: Blood pressure (!) 164/71, pulse 73, weight 133 lb 6.4 oz (60.5 kg), SpO2 96%.  Gen:     No distress, well appearing Lungs:  ctab no wheezes or crackles CV:      RRR no mrg   Data Reviewed: Imaging: I have personally reviewed the CT scan from Sept 2020 which shows mosaic attenuation. The ct chest in 2018 has diffuse GGOs which have improved in 2020.   PFTs:      Latest Ref Rng & Units 12/19/2022    1:41 PM 12/18/2021   11:42 AM 03/23/2020   10:39 AM 06/18/2017   10:36 AM 11/22/2016   10:43 AM  PFT Results  FVC-Pre L 2.01  1.71  1.20  1.57  1.73   FVC-Predicted Pre % 82  69  47  58  63   FVC-Post L 1.83  1.73  1.23  1.58  1.76   FVC-Predicted Post % 75  70  48  58  64   Pre FEV1/FVC % % 83  93  99  91  92   Post FEV1/FCV % % 93  92  97  92  91   FEV1-Pre L 1.67  1.60  1.18  1.42  1.59   FEV1-Predicted Pre % 90  85  61  68  76   FEV1-Post L 1.69  1.59  1.19  1.46  1.60   DLCO uncorrected ml/min/mmHg 13.94  12.09   11.45  11.90   DLCO UNC% % 87  75   65  67   DLCO corrected ml/min/mmHg 13.58  11.44    11.75   DLCO COR %Predicted % 85  71    67   DLVA Predicted % 119  87   104  98   TLC L 3.39  3.62  2.51  2.99  2.81   TLC % Predicted % 81  87  60  69  65   RV % Predicted % 51  57  45  42  55    I have personally reviewed the patient's PFTs and they show moderate restriction to ventilation. FVC Nov 2023 shows stable lung function (improved actually) to 1 and 4 years prior. DLCO stable.   Labs: Labs reviewed - had autoimmune serologies negative in 2018. HP panel negative.  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 I think the inflammation in your lungs has resolved and we can continue to monitor you off the medication. Hopefully since you have changed jobs from the del [employer] you should not have recurrence. If your breathing worsens please call me so we can check your CT scan and breathing testing.   Return to Care: Return if symptoms worsen or fail to improve.   Louie Rover, MD Pulmonary and Critical Care Medicine [facility] Office:[phone]

## 2023-07-18 NOTE — Patient Instructions (Addendum)
 It was a pleasure to see you today!  Please schedule follow up as needed. Please call [phone] if issues or concerns arise. You can also send us  a message through MyChart, but but aware that this is not to be used for urgent issues and it may take up to 5-7 days to receive a reply. Please be aware that you will likely be able to view your results before I have a chance to respond to them. Please give us  5 business days to respond to any non-urgent results.    I'm glad your breathing is doing well. I think the inflammation in your lungs (hypersensitivity pneumonitis)  has resolved and we can continue to monitor you off the medication. Hopefully since you have changed jobs from the del [employer] you should not have recurrence. If your breathing worsens please call me so we can check your CT scan and breathing testing.   Fue un placer verte hoy!  Por favor, programa una cita de seguimiento segn sea necesario. Si tienes algn problema o inquietud, llama al [phone]. Tambin puedes enviarnos un mensaje a travs de MyChart, pero ten en cuenta que no es para asuntos urgentes y que la respuesta puede tardar de 5 a 7 das. Es probable que puedas ver tus resultados antes de que pueda responderlos. Por favor, danos 5 das hbiles para responder a cualquier resultado que no sea urgente.  Me alegra que tu respiracin est bien. Creo que la inflamacin en tus pulmones (neumonitis por hipersensibilidad) se ha resuelto y podemos seguir monitorendote sin la medicacin. Esperamos que, dado que cambiaste de trabajo en la [employer] [place], no tengas recurrencia. Si tu respiracin empeora, llmame para que podamos revisar tu tomografa computarizada y las pruebas de respiracin.

## 2023-08-14 ENCOUNTER — Other Ambulatory Visit: Payer: Self-pay | Admitting: Internal Medicine

## 2023-08-14 ENCOUNTER — Other Ambulatory Visit: Payer: Self-pay

## 2023-08-14 DIAGNOSIS — E1142 Type 2 diabetes mellitus with diabetic polyneuropathy: Secondary | ICD-10-CM

## 2023-08-14 NOTE — Telephone Encounter (Unsigned)
 Copied from CRM [id number]. Topic: Clinical - Medication Refill >> Aug 14, 2023  3:45 PM Charlet HERO wrote: Medication: gabapentin  (NEURONTIN ) 100 MG capsule  Has the patient contacted their pharmacy? Yes Informed patient of no refills   This is the patient's preferred pharmacy:  [facility] [address], [address] [place] [address] Phone: [phone] Fax: [phone]    Is this the correct pharmacy for this prescription? Yes If no, delete pharmacy and type the correct one.   Has the prescription been filled recently? Yes  Is the patient out of the medication? No  Has the patient been seen for an appointment in the last year OR does the patient have an upcoming appointment? Yes  Can we respond through MyChart? No  Agent: Please be advised that Rx refills may take up to 3 business days. We ask that you follow-up with your pharmacy.

## 2023-08-15 ENCOUNTER — Inpatient Hospital Stay (HOSPITAL_COMMUNITY)
Admission: EM | Admit: 2023-08-15 | Discharge: 2023-08-20 | DRG: 322 | Disposition: A | Discharge status: Home / Self Care | Payer: Self-pay | Attending: Internal Medicine | Admitting: Internal Medicine

## 2023-08-15 ENCOUNTER — Other Ambulatory Visit: Payer: Self-pay

## 2023-08-15 ENCOUNTER — Emergency Department (HOSPITAL_COMMUNITY): Payer: Self-pay

## 2023-08-15 DIAGNOSIS — Z7982 Long term (current) use of aspirin: Secondary | ICD-10-CM

## 2023-08-15 DIAGNOSIS — I214 Non-ST elevation (NSTEMI) myocardial infarction: Principal | ICD-10-CM | POA: Diagnosis present

## 2023-08-15 DIAGNOSIS — R079 Chest pain, unspecified: Principal | ICD-10-CM | POA: Diagnosis present

## 2023-08-15 DIAGNOSIS — I700 Atherosclerosis of aorta: Secondary | ICD-10-CM | POA: Diagnosis present

## 2023-08-15 DIAGNOSIS — I200 Unstable angina: Secondary | ICD-10-CM

## 2023-08-15 DIAGNOSIS — Z90710 Acquired absence of both cervix and uterus: Secondary | ICD-10-CM

## 2023-08-15 DIAGNOSIS — E119 Type 2 diabetes mellitus without complications: Secondary | ICD-10-CM | POA: Diagnosis present

## 2023-08-15 DIAGNOSIS — R9431 Abnormal electrocardiogram [ECG] [EKG]: Secondary | ICD-10-CM

## 2023-08-15 DIAGNOSIS — Z8249 Family history of ischemic heart disease and other diseases of the circulatory system: Secondary | ICD-10-CM

## 2023-08-15 DIAGNOSIS — Z955 Presence of coronary angioplasty implant and graft: Secondary | ICD-10-CM

## 2023-08-15 DIAGNOSIS — Z79899 Other long term (current) drug therapy: Secondary | ICD-10-CM

## 2023-08-15 DIAGNOSIS — I10 Essential (primary) hypertension: Secondary | ICD-10-CM | POA: Diagnosis present

## 2023-08-15 DIAGNOSIS — E781 Pure hyperglyceridemia: Secondary | ICD-10-CM | POA: Diagnosis present

## 2023-08-15 LAB — BASIC METABOLIC PANEL WITH GFR
Anion gap: 12 (ref 5–15)
BUN: 23 mg/dL (ref 8–23)
CO2: 19 mmol/L — ABNORMAL LOW (ref 22–32)
Calcium: 9.5 mg/dL (ref 8.9–10.3)
Chloride: 104 mmol/L (ref 98–111)
Creatinine, Ser: 0.83 mg/dL (ref 0.44–1.00)
GFR, Estimated: >60 mL/min (ref >60)
Glucose, Bld: 203 mg/dL — ABNORMAL HIGH (ref 70–99)
Potassium: 3.9 mmol/L (ref 3.5–5.1)
Sodium: 135 mmol/L (ref 135–145)

## 2023-08-15 LAB — TROPONIN I (HIGH SENSITIVITY)
Troponin I (High Sensitivity): 7 ng/L (ref <18)
Troponin I (High Sensitivity): 12 ng/L (ref <18)

## 2023-08-15 LAB — CBC
HCT: 46.1 % — ABNORMAL HIGH (ref 36.0–46.0)
Hemoglobin: 14.6 g/dL (ref 12.0–15.0)
MCH: 26.3 pg (ref 26.0–34.0)
MCHC: 31.7 g/dL (ref 30.0–36.0)
MCV: 83.1 fL (ref 80.0–100.0)
Platelets: 362 K/uL (ref 150–400)
RBC: 5.55 MIL/uL — ABNORMAL HIGH (ref 3.87–5.11)
RDW: 14.5 % (ref 11.5–15.5)
WBC: 9.2 K/uL (ref 4.0–10.5)
nRBC: 0 % (ref 0.0–0.2)

## 2023-08-15 MED ORDER — GABAPENTIN 100 MG PO CAPS
200.0000 mg | ORAL_CAPSULE | Freq: Two times a day (BID) | ORAL | 0 refills | Status: DC
  Filled 2023-08-15 – 2023-08-26 (×2): qty 360, 90d supply, fill #0

## 2023-08-15 NOTE — ED Triage Notes (Signed)
 Patient here from home reporting central chest pain radiating up into neck and jaw. Nausea no vomiting.

## 2023-08-15 NOTE — Telephone Encounter (Signed)
 Requested Prescriptions  Pending Prescriptions Disp Refills   gabapentin  (NEURONTIN ) 100 MG capsule 360 capsule 0    Sig: Take 2 capsules (200 mg total) by mouth 2 (two) times daily.     Neurology: Anticonvulsants - gabapentin  Passed - 08/15/2023  4:08 PM      Passed - Cr in normal range and within 360 days    Creatinine, Ser  Date Value Ref Range Status  06/27/2023 0.65 0.57 - 1.00 mg/dL Final         Passed - Completed PHQ-2 or PHQ-9 in the last 360 days      Passed - Valid encounter within last 12 months    Recent Outpatient Visits           1 month ago Type 2 diabetes mellitus with peripheral neuropathy (HCC)   [facility] [place]. [facility] Vicci Sober B, MD   5 months ago Type 2 diabetes mellitus with peripheral neuropathy [facility])   [facility] Of [place]. [facility] Vicci Sober NOVAK, MD   9 months ago Hypertension associated with diabetes [facility])   [facility] Dept Of [place]. [facility] Fleeta Morris, [place] L, RPH-CPP   10 months ago Type 2 diabetes mellitus with peripheral neuropathy [facility])   [facility] Of [place]. [facility] Vicci Sober NOVAK, MD   1 year ago Type 2 diabetes mellitus with peripheral neuropathy [facility])   [facility] - A Dept Of [place]. [facility] Vicci Sober NOVAK, MD

## 2023-08-15 NOTE — ED Provider Notes (Signed)
  EMERGENCY DEPARTMENT AT [facility] Provider Note   CSN: [id number] Arrival date & time: 08/15/23  1812     Patient presents with: Chest Pain and Jaw Pain   Tammy Osborne is a 68 y.o. female.  {Add pertinent medical, surgical, social history, OB history to HPI:291} 68 year old female presents with family for chest pain (left side, burning in nature, stops breathing) moves to face and arms, bilateral face and arms, onset 3 days ago. Takes BP meds, unsure if related but BP has been high. Pain is intermittent, when present it is hard and takes time to ease, lasts 25 minutes to 30 minutes at a time. Pain is worse with walking or working. Pain improves with sitting down/resting. Associated with nausea, sweating, shortness of breath. Denies vomiting. Last episode of pain was today at 3pm, and then again when walking back to her room in the ER, no pain at present time.  History of similar pain a long time ago (maybe 5 years ago), burning chest pain with walking really fast. Was seen at that time, not told why this was happening, was given BP meds. Had a stress test at that time, no other testing she can recall, no follow up.  Non smoker. Hx of HTN, HLD. No hx of DM, although DM is on history.  No family cardiac history.   Language interpreter used: [phone] [facility].  Chest Pain      Prior to Admission medications   Medication Sig Start Date End Date Taking? Authorizing Provider  amLODipine  (NORVASC ) 10 MG tablet Take 1 tablet (10 mg total) by mouth daily. 06/27/23   Vicci Barnie NOVAK, MD  aspirin  EC 81 MG tablet Take 1 tablet (81 mg total) by mouth daily. 09/11/18   Vicci Barnie NOVAK, MD  carvedilol  (COREG ) 12.5 MG tablet Take 1.5 tablets (18.75 mg total) by mouth 2 (two) times daily with a meal. 06/27/23   Vicci Barnie NOVAK, MD  gabapentin  (NEURONTIN ) 100 MG capsule Take 2 capsules (200 mg total) by mouth 2 (two) times daily. 08/15/23   Vicci Barnie NOVAK, MD   losartan  (COZAAR ) 100 MG tablet Take 1 tablet (100 mg total) by mouth daily. 06/27/23   Vicci Barnie NOVAK, MD  omeprazole  (PRILOSEC) 20 MG capsule Take 1 capsule (20 mg total) by mouth 2 (two) times daily before a meal. 10/19/22   Vicci Barnie NOVAK, MD  rosuvastatin  (CRESTOR ) 5 MG tablet Take 1 tablet (5 mg total) by mouth daily. 06/27/23   Vicci Barnie NOVAK, MD    Allergies: Patient has no known allergies.    Review of Systems  Cardiovascular:  Positive for chest pain.  Negative except as per HPI  Updated Vital Signs BP (!) 141/56   Pulse 71   Temp 97.6 F (36.4 C) (Oral)   Resp 17   Ht 4' 10 (1.473 m)   Wt 61 kg   LMP  (LMP Unknown)   SpO2 99%   BMI 28.11 kg/m   Physical Exam Vitals and nursing note reviewed.  Constitutional:      General: She is not in acute distress.    Appearance: She is well-developed. She is not diaphoretic.  HENT:     Head: Normocephalic and atraumatic.  Cardiovascular:     Rate and Rhythm: Normal rate and regular rhythm.     Heart sounds: Normal heart sounds.  Pulmonary:     Effort: Pulmonary effort is normal.     Breath sounds: Normal breath  sounds. No decreased breath sounds or wheezing.  Chest:     Chest wall: No tenderness.  Abdominal:     Palpations: Abdomen is soft.     Tenderness: There is no abdominal tenderness.  Musculoskeletal:     Cervical back: Neck supple.     Right lower leg: No tenderness. No edema.     Left lower leg: No tenderness. No edema.  Skin:    General: Skin is warm and dry.     Findings: No erythema or rash.  Neurological:     Mental Status: She is alert and oriented to person, place, and time.  Psychiatric:        Behavior: Behavior normal.     (all labs ordered are listed, but only abnormal results are displayed) Labs Reviewed  BASIC METABOLIC PANEL WITH GFR - Abnormal; Notable for the following components:      Result Value   CO2 19 (*)    Glucose, Bld 203 (*)    All other components within normal  limits  CBC - Abnormal; Notable for the following components:   RBC 5.55 (*)    HCT 46.1 (*)    All other components within normal limits  TROPONIN I (HIGH SENSITIVITY)  TROPONIN I (HIGH SENSITIVITY)    EKG: EKG Interpretation Date/Time:  Thursday August 15 2023 18:24:03 EDT Ventricular Rate:  84 PR Interval:  181 QRS Duration:  97 QT Interval:  357 QTC Calculation: 422 R Axis:   47  Text Interpretation: Sinus rhythm Confirmed by Ruthe Cornet [phone]) on 08/15/2023 7:00:10 PM  Radiology: [facility] Chest Port 1 View Result Date: 08/15/2023 CLINICAL DATA:  Chest pain EXAM: PORTABLE CHEST 1 VIEW COMPARISON:  09/28/2019 FINDINGS: The heart size and mediastinal contours are within normal limits. Both lungs are clear. The visualized skeletal structures are unremarkable. IMPRESSION: No active disease. Electronically Signed   By: Oneil Devonshire M.D.   On: 08/15/2023 19:29    {Document cardiac monitor, telemetry assessment procedure when appropriate:32947} Procedures   Medications Ordered in the ED - No data to display    {Click here for ABCD2, HEART and other calculators REFRESH Note before signing:1}                              Medical Decision Making  This patient presents to the ED for concern of chest pain, this involves an extensive number of treatment options, and is a complaint that carries with it a high risk of complications and morbidity.  The differential diagnosis includes but not limited to NSTEMI, angina    Co morbidities / Chronic conditions that complicate the patient evaluation  HTN, ?DM   Additional history obtained:  Additional history obtained from EMR External records from outside source obtained and reviewed including ***   Lab Tests:  I Ordered, and personally interpreted labs.  The pertinent results include:  ***   Imaging Studies ordered:  I ordered imaging studies including ***  I independently visualized and interpreted imaging which showed *** I  agree with the radiologist interpretation   Cardiac Monitoring: / EKG:  The patient was maintained on a cardiac monitor.  I personally viewed and interpreted the cardiac monitored which showed an underlying rhythm of: ***   Problem List / ED Course / Critical interventions / Medication management  *** I ordered medication including ***   Reevaluation of the patient after these medicines showed that the patient *** I have reviewed  the patients home medicines and have made adjustments as needed   Consultations Obtained:  I requested consultation with the ***,  and discussed lab and imaging findings as well as pertinent plan - they recommend: ***   Social Determinants of Health:  ***   Test / Admission - Considered:  ***   {Document critical care time when appropriate  Document review of labs and clinical decision tools ie CHADS2VASC2, etc  Document your independent review of radiology images and any outside records  Document your discussion with family members, caretakers and with consultants  Document social determinants of health affecting pt's care  Document your decision making why or why not admission, treatments were needed:32947:::1}   Final diagnoses:  None    ED Discharge Orders     None

## 2023-08-15 NOTE — ED Notes (Signed)
 Save blue tube in main lab

## 2023-08-16 ENCOUNTER — Inpatient Hospital Stay (HOSPITAL_COMMUNITY): Payer: Self-pay

## 2023-08-16 ENCOUNTER — Encounter (HOSPITAL_COMMUNITY): Payer: Self-pay | Admitting: Internal Medicine

## 2023-08-16 ENCOUNTER — Other Ambulatory Visit: Payer: Self-pay

## 2023-08-16 DIAGNOSIS — R079 Chest pain, unspecified: Secondary | ICD-10-CM

## 2023-08-16 LAB — HEMOGLOBIN A1C
Hgb A1c MFr Bld: 6.2 % — ABNORMAL HIGH (ref 4.8–5.6)
Mean Plasma Glucose: 131.24 mg/dL

## 2023-08-16 LAB — LIPID PANEL
Cholesterol: 198 mg/dL (ref 0–200)
HDL: 48 mg/dL (ref >40)
LDL Cholesterol: 107 mg/dL — ABNORMAL HIGH (ref 0–99)
Total CHOL/HDL Ratio: 4.1 ratio
Triglycerides: 216 mg/dL — ABNORMAL HIGH (ref <150)
VLDL: 43 mg/dL — ABNORMAL HIGH (ref 0–40)

## 2023-08-16 LAB — D-DIMER, QUANTITATIVE: D-Dimer, Quant: 0.31 ug{FEU}/mL (ref 0.00–0.50)

## 2023-08-16 LAB — CBC
HCT: 41.6 % (ref 36.0–46.0)
Hemoglobin: 13.6 g/dL (ref 12.0–15.0)
MCH: 26.6 pg (ref 26.0–34.0)
MCHC: 32.7 g/dL (ref 30.0–36.0)
MCV: 81.4 fL (ref 80.0–100.0)
Platelets: 338 K/uL (ref 150–400)
RBC: 5.11 MIL/uL (ref 3.87–5.11)
RDW: 14.4 % (ref 11.5–15.5)
WBC: 14.1 K/uL — ABNORMAL HIGH (ref 4.0–10.5)
nRBC: 0 % (ref 0.0–0.2)

## 2023-08-16 LAB — ECHOCARDIOGRAM COMPLETE
Area-P 1/2: 5.95 cm2
Calc EF: 60.1 %
Height: 58 in
S' Lateral: 2.1 cm
Single Plane A2C EF: 56.7 %
Single Plane A4C EF: 63.4 %
Weight: 2151.69 [oz_av]

## 2023-08-16 LAB — CREATININE, SERUM
Creatinine, Ser: 0.58 mg/dL (ref 0.44–1.00)
GFR, Estimated: >60 mL/min (ref >60)

## 2023-08-16 LAB — BRAIN NATRIURETIC PEPTIDE: B Natriuretic Peptide: 175.1 pg/mL — ABNORMAL HIGH (ref 0.0–100.0)

## 2023-08-16 LAB — TROPONIN I (HIGH SENSITIVITY)
Troponin I (High Sensitivity): 17 ng/L (ref <18)
Troponin I (High Sensitivity): 34 ng/L — ABNORMAL HIGH (ref <18)
Troponin I (High Sensitivity): 49 ng/L — ABNORMAL HIGH (ref <18)

## 2023-08-16 LAB — HIV ANTIBODY (ROUTINE TESTING W REFLEX): HIV Screen 4th Generation wRfx: NONREACTIVE

## 2023-08-16 MED ORDER — ONDANSETRON HCL 4 MG/2ML IJ SOLN: 4.0000 mg | Freq: Four times a day (QID) | INTRAMUSCULAR | Status: DC | PRN

## 2023-08-16 MED ORDER — ROSUVASTATIN CALCIUM 5 MG PO TABS
5.0000 mg | ORAL_TABLET | Freq: Every day | ORAL | Status: DC
  Administered 2023-08-16 – 2023-08-20 (×5): 5 mg via ORAL
  Filled 2023-08-16 (×5): qty 1

## 2023-08-16 MED ORDER — AMLODIPINE BESYLATE 10 MG PO TABS
10.0000 mg | ORAL_TABLET | Freq: Every day | ORAL | Status: DC
  Administered 2023-08-16 – 2023-08-20 (×5): 10 mg via ORAL
  Filled 2023-08-16: qty 2
  Filled 2023-08-16 (×4): qty 1

## 2023-08-16 MED ORDER — HEPARIN SODIUM (PORCINE) 5000 UNIT/ML IJ SOLN
5000.0000 [IU] | Freq: Three times a day (TID) | INTRAMUSCULAR | Status: DC
  Administered 2023-08-16 – 2023-08-18 (×7): 5000 [IU] via SUBCUTANEOUS
  Filled 2023-08-16 (×7): qty 1

## 2023-08-16 MED ORDER — ALUM & MAG HYDROXIDE-SIMETH 200-200-20 MG/5ML PO SUSP
30.0000 mL | Freq: Once | ORAL | Status: AC
  Administered 2023-08-16: 30 mL via ORAL
  Filled 2023-08-16: qty 30

## 2023-08-16 MED ORDER — ACETAMINOPHEN 325 MG PO TABS
650.0000 mg | ORAL_TABLET | ORAL | Status: DC | PRN
  Administered 2023-08-17: 650 mg via ORAL
  Filled 2023-08-16: qty 2

## 2023-08-16 MED ORDER — PANTOPRAZOLE SODIUM 40 MG PO TBEC
40.0000 mg | DELAYED_RELEASE_TABLET | Freq: Every day | ORAL | Status: DC
  Administered 2023-08-16 – 2023-08-20 (×5): 40 mg via ORAL
  Filled 2023-08-16 (×5): qty 1

## 2023-08-16 MED ORDER — SODIUM CHLORIDE 0.9 % IV SOLN: INTRAVENOUS | Status: AC

## 2023-08-16 MED ORDER — GABAPENTIN 100 MG PO CAPS
200.0000 mg | ORAL_CAPSULE | Freq: Two times a day (BID) | ORAL | Status: DC
  Administered 2023-08-16 – 2023-08-20 (×9): 200 mg via ORAL
  Filled 2023-08-16 (×9): qty 2

## 2023-08-16 MED ORDER — LOSARTAN POTASSIUM 50 MG PO TABS
100.0000 mg | ORAL_TABLET | Freq: Every day | ORAL | Status: DC
  Administered 2023-08-16 – 2023-08-20 (×5): 100 mg via ORAL
  Filled 2023-08-16 (×5): qty 2

## 2023-08-16 MED ORDER — ASPIRIN 81 MG PO CHEW
324.0000 mg | CHEWABLE_TABLET | Freq: Once | ORAL | Status: DC
  Filled 2023-08-16 (×2): qty 4

## 2023-08-16 MED ORDER — CARVEDILOL 6.25 MG PO TABS
18.7500 mg | ORAL_TABLET | Freq: Two times a day (BID) | ORAL | Status: DC
  Administered 2023-08-16 – 2023-08-20 (×8): 18.75 mg via ORAL
  Filled 2023-08-16 (×6): qty 1
  Filled 2023-08-16: qty 2
  Filled 2023-08-16 (×2): qty 1

## 2023-08-16 MED ORDER — ASPIRIN 81 MG PO TBEC
81.0000 mg | DELAYED_RELEASE_TABLET | Freq: Every day | ORAL | Status: DC
  Administered 2023-08-17 – 2023-08-20 (×4): 81 mg via ORAL
  Filled 2023-08-16 (×4): qty 1

## 2023-08-16 MED ORDER — NITROGLYCERIN 0.4 MG SL SUBL
0.4000 mg | SUBLINGUAL_TABLET | SUBLINGUAL | Status: DC | PRN
  Administered 2023-08-16 – 2023-08-18 (×8): 0.4 mg via SUBLINGUAL
  Filled 2023-08-16 (×5): qty 1

## 2023-08-16 NOTE — H&P (Signed)
 History and Physical    Tameisha Covell FMW:[id number] DOB: [birth year] DOA: 08/15/2023  PCP: Vicci Barnie NOVAK, MD  Patient coming from: home  I have personally briefly reviewed patient's old medical records in [facility]  Chief Complaint: chest pain   HPI: Tammy Osborne is a 68 y.o. female with medical history significant of DMII, HTN,HLD, who presents to ED with substernal chest pain with radiation to neck,jaw and shoulder that started today. Patient notes its worse with exertion and better with rest. She also notes associated n/diaphoresis.  Patient currently notes no chest pain currently and states she feels much improved.   ED Course:  IN ED patient evaluated noted to have continued chest pain, in setting of slightly elevated cardiac enzyme. Case was discussed with cardiology on call who reviewed EKG and chart and note no acute ischemic findings. However did recommend admission for further risk stratification.   Vitals:  Afeb, bp 188/68, HR 85, rr 16, sat 99%  EKG: snr   diffuse t wave flattening unchanged from prior Cxr:NAD Wbc 9.2, hgb 146, plt362 Na 135, K 3.9, Cl 104, bicar 19, glu 203, cr 0.83 CE7,12,17 Tx nitroglycerin,asa324 Review of Systems: As per HPI otherwise 10 point review of systems negative.   Past Medical History:  Diagnosis Date   Diabetes mellitus without complication (HCC)    Hypertension     Past Surgical History:  Procedure Laterality Date   ABDOMINAL HYSTERECTOMY       reports that she has never smoked. She has been exposed to tobacco smoke. She has never used smokeless tobacco. She reports that she does not drink alcohol. No history on file for drug use.  No Known Allergies  Family History  Problem Relation Age of Onset   Hypertension Mother     Prior to Admission medications   Medication Sig Start Date End Date Taking? Authorizing Provider  amLODipine  (NORVASC ) 10 MG tablet Take 1 tablet (10 mg total) by mouth daily.  06/27/23   Vicci Barnie NOVAK, MD  aspirin  EC 81 MG tablet Take 1 tablet (81 mg total) by mouth daily. 09/11/18   Vicci Barnie NOVAK, MD  carvedilol  (COREG ) 12.5 MG tablet Take 1.5 tablets (18.75 mg total) by mouth 2 (two) times daily with a meal. 06/27/23   Vicci Barnie NOVAK, MD  gabapentin  (NEURONTIN ) 100 MG capsule Take 2 capsules (200 mg total) by mouth 2 (two) times daily. 08/15/23   Vicci Barnie NOVAK, MD  losartan  (COZAAR ) 100 MG tablet Take 1 tablet (100 mg total) by mouth daily. 06/27/23   Vicci Barnie NOVAK, MD  omeprazole  (PRILOSEC) 20 MG capsule Take 1 capsule (20 mg total) by mouth 2 (two) times daily before a meal. 10/19/22   Vicci Barnie NOVAK, MD  rosuvastatin  (CRESTOR ) 5 MG tablet Take 1 tablet (5 mg total) by mouth daily. 06/27/23   Vicci Barnie NOVAK, MD    Physical Exam: Vitals:   08/15/23 2054 08/15/23 2108 08/15/23 2219 08/16/23 0015  BP:  (!) 141/56  (!) 150/92  Pulse:  71  69  Resp:  17  17  Temp:   98.5 F (36.9 C) 98.6 F (37 C)  TempSrc:   Oral   SpO2:  99%  97%  Weight: 61 kg     Height: 4' 10 (1.473 m)       Constitutional: NAD, calm, comfortable Vitals:   08/15/23 2054 08/15/23 2108 08/15/23 2219 08/16/23 0015  BP:  (!) 141/56  (!) 150/92  Pulse:  71  69  Resp:  17  17  Temp:   98.5 F (36.9 C) 98.6 F (37 C)  TempSrc:   Oral   SpO2:  99%  97%  Weight: 61 kg     Height: 4' 10 (1.473 m)      Eyes: PERRL, lids and conjunctivae normal ENMT: Mucous membranes are moist. Posterior pharynx clear of any exudate or lesions.Normal dentition.  Neck: normal, supple, no masses, no thyromegaly Respiratory: clear to auscultation bilaterally, no wheezing, no crackles. Normal respiratory effort. No accessory muscle use.  Cardiovascular: Regular rate and rhythm, no murmurs / rubs / gallops. No extremity edema. 2+ pedal pulses.   Abdomen: no tenderness, no masses palpated. No hepatosplenomegaly. Bowel sounds positive.  Musculoskeletal: no clubbing / cyanosis. No  joint deformity upper and lower extremities. Good ROM, no contractures. Normal muscle tone.  Skin: no rashes, lesions, ulcers. No induration Neurologic: CN 2-12 grossly intact. Sensation intact,  Strength 5/5 in all 4.  Psychiatric: Normal judgment and insight. Alert and oriented x 3. Normal mood.    Labs on Admission: I have personally reviewed following labs and imaging studies  CBC: Recent Labs  Lab 08/15/23 1921  WBC 9.2  HGB 14.6  HCT 46.1*  MCV 83.1  PLT 362   Basic Metabolic Panel: Recent Labs  Lab 08/15/23 1921  NA 135  K 3.9  CL 104  CO2 19*  GLUCOSE 203*  BUN 23  CREATININE 0.83  CALCIUM  9.5   GFR: Estimated Creatinine Clearance: 50.8 mL/min (by C-G formula based on SCr of 0.83 mg/dL). Liver Function Tests: No results for input(s): AST, ALT, ALKPHOS, BILITOT, PROT, ALBUMIN in the last 168 hours. No results for input(s): LIPASE, AMYLASE in the last 168 hours. No results for input(s): AMMONIA in the last 168 hours. Coagulation Profile: No results for input(s): INR, PROTIME in the last 168 hours. Cardiac Enzymes: No results for input(s): CKTOTAL, CKMB, CKMBINDEX, TROPONINI in the last 168 hours. BNP (last 3 results) No results for input(s): PROBNP in the last 8760 hours. HbA1C: No results for input(s): HGBA1C in the last 72 hours. CBG: No results for input(s): GLUCAP in the last 168 hours. Lipid Profile: No results for input(s): CHOL, HDL, LDLCALC, TRIG, CHOLHDL, LDLDIRECT in the last 72 hours. Thyroid Function Tests: No results for input(s): TSH, T4TOTAL, FREET4, T3FREE, THYROIDAB in the last 72 hours. Anemia Panel: No results for input(s): VITAMINB12, FOLATE, FERRITIN, TIBC, IRON, RETICCTPCT in the last 72 hours. Urine analysis:    Component Value Date/Time   COLORURINE STRAW (A) 08/15/2017 0551   APPEARANCEUR CLEAR 08/15/2017 0551   LABSPEC 1.010 08/15/2017 0551   PHURINE 5.0  08/15/2017 0551   GLUCOSEU NEGATIVE 08/15/2017 0551   HGBUR NEGATIVE 08/15/2017 0551   BILIRUBINUR negative 06/27/2023 1604   KETONESUR negative 06/27/2023 1604   KETONESUR NEGATIVE 08/15/2017 0551   PROTEINUR NEGATIVE 08/15/2017 0551   UROBILINOGEN 0.2 06/27/2023 1604   NITRITE Negative 06/27/2023 1604   NITRITE NEGATIVE 08/15/2017 0551   LEUKOCYTESUR Negative 06/27/2023 1604    Radiological Exams on Admission: DG Chest Port 1 View Result Date: 08/15/2023 CLINICAL DATA:  Chest pain EXAM: PORTABLE CHEST 1 VIEW COMPARISON:  09/28/2019 FINDINGS: The heart size and mediastinal contours are within normal limits. Both lungs are clear. The visualized skeletal structures are unremarkable. IMPRESSION: No active disease. Electronically Signed   By: Oneil Devonshire M.D.   On: 08/15/2023 19:29    EKG: Independently reviewed. See above  Assessment/Plan  Chest  pain r/o ACS -initial ischemic work up with stable EKG , flat trop without significant delta  - history however concerning as pain is exertional  and persistent  -admit to progressive care  -s/p asa in ED - placed on chest pain protocol  - cycle ce , echo in am   Hypertension  -stable  -resume carvedilol  and amlodipine    DMII -patient denies hx of DMII -check A1c  - patient denies this history  -will monitor poc glucose    DVT prophylaxis: heparin Code Status: full/ as discussed per patient wishes in event of cardiac arrest  Family Communication: none at bedside Disposition Plan: patient  expected to be admitted greater than 2 midnights  Consults called: Dr Gail Cardiology Admission status: progressive care   Camila DELENA Ned MD [facility]   If 7PM-7AM, please contact night-coverage [email or web address] Password [email or web address]  08/16/2023, 1:51 AM

## 2023-08-16 NOTE — Progress Notes (Signed)
 Subjective: Patient admitted this morning, see detailed H&P by Dr Debby 68 y.o. female with medical history significant of DMII, HTN,HLD, who presents to ED with substernal chest pain with radiation to neck,jaw and shoulder that started today. Patient notes its worse with exertion and better with rest. She also notes associated n/diaphoresis.  Patient currently notes no chest pain currently and states she feels much improved.  Vitals:   08/16/23 0500 08/16/23 0530  BP: 134/62 (!) 142/62  Pulse: 71 63  Resp: (!) 22 14  Temp: 97.7 F (36.5 C)   SpO2: 96% 95%      A/P  Chest pain r/o ACS -initial ischemic work up with stable EKG , flat trop without significant delta  - history however concerning as pain is exertional  and persistent  -admit to progressive care  -s/p asa in ED - placed on chest pain protocol  - Echocardiogram obtained, result pending -As patient is very typical chest pain, will consult cardiology for possible stress test in the hospital   Hypertension  -stable  -resume carvedilol  and amlodipine     DMII -patient denies hx of DMII -check A1c  - patient denies this history  -will monitor poc glucose    Tammy Osborne Brod [facility]

## 2023-08-16 NOTE — Plan of Care (Signed)

## 2023-08-17 DIAGNOSIS — R9431 Abnormal electrocardiogram [ECG] [EKG]: Secondary | ICD-10-CM

## 2023-08-17 LAB — LIPID PANEL
Cholesterol: 176 mg/dL (ref 0–200)
HDL: 36 mg/dL — ABNORMAL LOW (ref >40)
LDL Cholesterol: 65 mg/dL (ref 0–99)
Total CHOL/HDL Ratio: 4.9 ratio
Triglycerides: 373 mg/dL — ABNORMAL HIGH (ref <150)
VLDL: 75 mg/dL — ABNORMAL HIGH (ref 0–40)

## 2023-08-17 LAB — TROPONIN I (HIGH SENSITIVITY): Troponin I (High Sensitivity): 56 ng/L — ABNORMAL HIGH (ref <18)

## 2023-08-17 NOTE — Consult Note (Addendum)
 Cardiology Consultation   Patient ID: Tammy Osborne MRN: [id number]; DOB: May 18, 1955  Admit date: 08/15/2023 Date of Consult: 08/17/2023  PCP:  Tammy Barnie NOVAK, MD   [facility] Providers Cardiologist:  None      Dr Tammy Osborne in 2021  Patient Profile: Tammy Osborne is a 68 y.o. 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  History of Present Illness: Tammy Osborne was evaluated by Dr Tammy Osborne in 2021 for CP. At that time, Ca Ca++ was seen on a chest CT, but MV was negative.   She was admitted 07/18 with chest pain, worse w/ exertion and better w/ rest. She had nausea and diaphoresis with it.   Troponin initially 7 but went up to 49. She got Mylanta, SL NTG x 1, but no ASA as she says she gets stomach pain from it. Her pain was relieved.   She describes her discomfort as a burning sensation in her central left chest radiating broadly towards her jaw and both shoulders, with facial numbness.  It is predictably brought on by light exertion but recently has also been occurring unpredictably at rest.  This is occurring despite the fact that she is on relatively high doses of antianginal medications (amlodipine  10 mg daily and carvedilol  18.75 mg twice daily) and her heart rate is typically in the 50s.  She is on aspirin  and lipid-lowering therapy.   Past Medical History:  Diagnosis Date   Diabetes mellitus without complication (HCC)    Hypertension     Past Surgical History:  Procedure Laterality Date   ABDOMINAL HYSTERECTOMY       Home Medications:  Prior to Admission medications   Medication Sig Start Date End Date Taking? Authorizing Provider  amLODipine  (NORVASC ) 10 MG tablet Take 1 tablet (10 mg total) by mouth daily. 06/27/23  Yes Tammy Barnie NOVAK, MD  aspirin  EC 81 MG tablet Take 1 tablet (81 mg  total) by mouth daily. 09/11/18  Yes Tammy Barnie NOVAK, MD  carvedilol  (COREG ) 12.5 MG tablet Take 1.5 tablets (18.75 mg total) by mouth 2 (two) times daily with a meal. Patient taking differently: Take 12.5 mg by mouth daily. 06/27/23  Yes Tammy Barnie NOVAK, MD  gabapentin  (NEURONTIN ) 100 MG capsule Take 2 capsules (200 mg total) by mouth 2 (two) times daily. Patient taking differently: Take 100 mg by mouth 2 (two) times daily. 08/15/23  Yes Tammy Barnie NOVAK, MD  losartan  (COZAAR ) 100 MG tablet Take 1 tablet (100 mg total) by mouth daily. 06/27/23  Yes Tammy Barnie NOVAK, MD  omeprazole  (PRILOSEC) 20 MG capsule Take 1 capsule (20 mg total) by mouth 2 (two) times daily before a meal. Patient taking differently: Take 20 mg by mouth daily. 10/19/22  Yes Tammy Barnie NOVAK, MD  rosuvastatin  (CRESTOR ) 5 MG tablet Take 1 tablet (5 mg total) by mouth daily. 06/27/23  Yes Tammy Barnie NOVAK, MD    Scheduled Meds:  amLODipine   10 mg Oral Daily   aspirin   324 mg Oral Once   aspirin  EC  81 mg Oral Daily   carvedilol   18.75 mg Oral BID   gabapentin   200 mg Oral BID   heparin   5,000 Units Subcutaneous Q8H   losartan   100 mg Oral Daily   pantoprazole   40 mg Oral Daily  rosuvastatin   5 mg Oral Daily   Continuous Infusions:  PRN Meds: acetaminophen , nitroGLYCERIN , ondansetron  (ZOFRAN ) IV  Allergies:   No Known Allergies  Social History:   Social History   Socioeconomic History   Marital status: Single    Spouse name: Not on file   Number of children: 2   Years of education: Not on file   Highest education level: Not on file  Occupational History   Not on file  Tobacco Use   Smoking status: Never    Passive exposure: Yes   Smokeless tobacco: Never   Tobacco comments:    spouse smoked in home X6-7 years.   Vaping Use   Vaping status: Never Used  Substance and Sexual Activity   Alcohol use: No   Drug use: Never   Sexual activity: Not on file  Other Topics Concern   Not on file   Social History Narrative   Not on file   Social Drivers of Health   Financial Resource Strain: Not on file  Food Insecurity: Not on file  Transportation Needs: Not on file  Physical Activity: Not on file  Stress: Not on file  Social Connections: Not on file  Intimate Partner Violence: Not on file    Family History:   Family History  Problem Relation Age of Onset   Hypertension Mother      ROS:  Please see the history of present illness.  All other ROS reviewed and negative.     Physical Exam/Data: Vitals:   08/16/23 2110 08/16/23 2142 08/17/23 0224 08/17/23 0529  BP: (!) 117/51 (!) 132/59 (!) 142/50 126/63  Pulse: (!) 58 (!) 59 (!) 58 (!) 59  Resp: 20  20 18   Temp: 97.6 F (36.4 C)  99.1 F (37.3 C) 97.8 F (36.6 C)  TempSrc: Oral  Oral Oral  SpO2: 97%  96% 95%  Weight:      Height:        Intake/Output Summary (Last 24 hours) at 08/17/2023 0750 Last data filed at 08/16/2023 1148 Gross per 24 hour  Intake 621.69 ml  Output --  Net 621.69 ml      08/15/2023    8:54 PM 07/18/2023   10:11 AM 06/27/2023    3:15 PM  Last 3 Weights  Weight (lbs) 134 lb 7.7 oz 133 lb 6.4 oz 137 lb  Weight (kg) 61 kg 60.51 kg 62.143 kg     Body mass index is 28.11 kg/m.  General:  Well nourished, well developed, in no acute distress HEENT: normal Neck: no JVD Vascular: No carotid bruits; Distal pulses 2+ bilaterally Cardiac:  normal S1, S2; RRR; no murmur  Lungs:  clear to auscultation bilaterally, no wheezing, rhonchi or rales  Abd: soft, nontender, no hepatomegaly  Ext: no edema Musculoskeletal:  No deformities, BUE and BLE strength normal and equal Skin: warm and dry  Neuro:  CNs 2-12 intact, no focal abnormalities noted Psych:  Normal affect   EKG:  The EKG was personally reviewed and demonstrates:   07/18 SR, HR 74, diffuse T wave flattening 07/17 SR, HR 84 nl T waves Telemetry:  Telemetry was personally reviewed and demonstrates:  SR  Relevant CV Studies:  ECHO:  08/16/2023  1. Left ventricular ejection fraction, by estimation, is 65 to 70%. The  left ventricle has normal function. The left ventricle has no regional  wall motion abnormalities. There is mild asymmetric left ventricular  hypertrophy. Left ventricular diastolic parameters are indeterminate.   2. Right ventricular  systolic function is normal. The right ventricular  size is normal.   3. Left atrial size was mild to moderately dilated.   4. The mitral valve is normal in structure. Trivial mitral valve  regurgitation.   5. The aortic valve is tricuspid. Aortic valve regurgitation is not  visualized.   6. The inferior vena cava is normal in size with greater than 50%  respiratory variability, suggesting right atrial pressure of 3 mmHg.   NM Stress 02/24/2019 Nuclear stress EF: 73%. The left ventricular ejection fraction is hyperdynamic (>65%). There was no ST segment deviation noted during stress. This is a low risk study. The study is normal. There is no evidence of ischemia or previous infarction.  Laboratory Data: High Sensitivity Troponin:   Recent Labs  Lab 08/15/23 1921 08/15/23 2240 08/16/23 0020 08/16/23 0336 08/16/23 0541  TROPONINIHS 7 12 17  34* 49*     Chemistry Recent Labs  Lab 08/15/23 1921 08/16/23 0336  NA 135  --   K 3.9  --   CL 104  --   CO2 19*  --   GLUCOSE 203*  --   BUN 23  --   CREATININE 0.83 0.58  CALCIUM  9.5  --   GFRNONAA >60 >60  ANIONGAP 12  --     No results for input(s): PROT, ALBUMIN, AST, ALT, ALKPHOS, BILITOT in the last 168 hours. Lipids  Recent Labs  Lab 08/17/23 0538  CHOL 176  TRIG 373*  HDL 36*  LDLCALC 65  CHOLHDL 4.9    Hematology Recent Labs  Lab 08/15/23 1921 08/16/23 0336  WBC 9.2 14.1*  RBC 5.55* 5.11  HGB 14.6 13.6  HCT 46.1* 41.6  MCV 83.1 81.4  MCH 26.3 26.6  MCHC 31.7 32.7  RDW 14.5 14.4  PLT 362 338   Thyroid No results for input(s): TSH, FREET4 in the last 168 hours.   BNP Recent Labs  Lab 08/16/23 0215  BNP 175.1*    DDimer  Recent Labs  Lab 08/16/23 0215  DDIMER 0.31    Radiology/Studies:  ECHOCARDIOGRAM COMPLETE Result Date: 08/16/2023    ECHOCARDIOGRAM REPORT   Patient Name:   Tammy Osborne Date of Exam: 08/16/2023 Medical Rec #:  [id number]              Height:       58.0 in Accession #:    [id number]             Weight:       134.5 lb Date of Birth:  1955/12/12               BSA:          1.538 m Patient Age:    68 years               BP:           128/59 mmHg Patient Gender: F                      HR:           60 bpm. Exam Location:  Inpatient Procedure: Cardiac Doppler and Color Doppler (Both Spectral and Color Flow            Doppler were utilized during procedure). Indications:    Chest Pain  History:        Patient has prior history of Echocardiogram examinations, most  recent 11/29/2016.  Sonographer:    Therisa Crouch Referring Phys: [id number] SARA-MAIZ A THOMAS IMPRESSIONS  1. Left ventricular ejection fraction, by estimation, is 65 to 70%. The left ventricle has normal function. The left ventricle has no regional wall motion abnormalities. There is mild asymmetric left ventricular hypertrophy. Left ventricular diastolic parameters are indeterminate.  2. Right ventricular systolic function is normal. The right ventricular size is normal.  3. Left atrial size was mild to moderately dilated.  4. The mitral valve is normal in structure. Trivial mitral valve regurgitation.  5. The aortic valve is tricuspid. Aortic valve regurgitation is not visualized.  6. The inferior vena cava is normal in size with greater than 50% respiratory variability, suggesting right atrial pressure of 3 mmHg. FINDINGS  Left Ventricle: Left ventricular ejection fraction, by estimation, is 65 to 70%. The left ventricle has normal function. The left ventricle has no regional wall motion abnormalities. The left ventricular internal cavity size was normal in size.  There is  mild asymmetric left ventricular hypertrophy. Left ventricular diastolic parameters are indeterminate. Right Ventricle: The right ventricular size is normal. Right vetricular wall thickness was not assessed. Right ventricular systolic function is normal. Left Atrium: Left atrial size was mild to moderately dilated. Right Atrium: Right atrial size was normal in size. Pericardium: There is no evidence of pericardial effusion. Mitral Valve: The mitral valve is normal in structure. Trivial mitral valve regurgitation. Tricuspid Valve: The tricuspid valve is normal in structure. Tricuspid valve regurgitation is trivial. Aortic Valve: The aortic valve is tricuspid. Aortic valve regurgitation is not visualized. Pulmonic Valve: The pulmonic valve was normal in structure. Pulmonic valve regurgitation is not visualized. Aorta: The aortic root and ascending aorta are structurally normal, with no evidence of dilitation. Venous: The inferior vena cava is normal in size with greater than 50% respiratory variability, suggesting right atrial pressure of 3 mmHg. IAS/Shunts: No atrial level shunt detected by color flow Doppler.  LEFT VENTRICLE PLAX 2D LVIDd:         3.70 cm     Diastology LVIDs:         2.10 cm     LV e' medial:    6.74 cm/s LV PW:         1.00 cm     LV E/e' medial:  8.3 LV IVS:        1.40 cm     LV e' lateral:   8.81 cm/s LVOT diam:     1.90 cm     LV E/e' lateral: 6.3 LVOT Area:     2.84 cm  LV Volumes (MOD) LV vol d, MOD A2C: 69.0 ml LV vol d, MOD A4C: 68.9 ml LV vol s, MOD A2C: 29.9 ml LV vol s, MOD A4C: 25.2 ml LV SV MOD A2C:     39.1 ml LV SV MOD A4C:     68.9 ml LV SV MOD BP:      42.1 ml RIGHT VENTRICLE            IVC RV Basal diam:  3.10 cm    IVC diam: 1.40 cm RV S prime:     8.92 cm/s TAPSE (M-mode): 2.1 cm LEFT ATRIUM             Index LA diam:        3.90 cm 2.54 cm/m LA Vol (A2C):   43.9 ml 28.54 ml/m LA Vol (A4C):   61.8 ml 40.17 ml/m LA Biplane Vol: 52.4 ml 34.06 ml/m  AORTA Ao Root  diam: 2.90 cm Ao Asc diam:  2.60 cm MITRAL VALVE               TRICUSPID VALVE MV Area (PHT): 5.95 cm    TR Peak grad:   15.1 mmHg MV E velocity: 55.70 cm/s  TR Vmax:        194.00 cm/s MV A velocity: 55.30 cm/s MV E/A ratio:  1.01        SHUNTS                            Systemic Diam: 1.90 cm Vina Gull MD Electronically signed by Vina Gull MD Signature Date/Time: 08/16/2023/5:41:12 PM    Final    DG Chest Port 1 View Result Date: 08/15/2023 CLINICAL DATA:  Chest pain EXAM: PORTABLE CHEST 1 VIEW COMPARISON:  09/28/2019 FINDINGS: The heart size and mediastinal contours are within normal limits. Both lungs are clear. The visualized skeletal structures are unremarkable. IMPRESSION: No active disease. Electronically Signed   By: Oneil Devonshire M.D.   On: 08/15/2023 19:29     Assessment and Plan: Angina pectoris: She describes typical exertional angina pectoris with minimal activity and has recently began experiencing angina at rest in a pattern consistent with unstable coronary syndrome.  Minimal elevation in cardiac enzymes and no major ST segment elevation changes on EKG, although she has diffuse T wave flattening.  Recommend coronary angiography and appropriate revascularization.  (Unless she develops acute ST segment elevation over the weekend in which case we will do emergency cardiac catheterization).  Continue aspirin  and lipid-lowering therapy.  Note severe atherosclerosis of the abdominal aorta on CT.  Informed Consent   Shared Decision Making/Informed Consent The risks [stroke (1 in 1000), death (1 in 1000), kidney failure [usually temporary] (1 in 500), bleeding (1 in 200), allergic reaction [possibly serious] (1 in 200)], benefits (diagnostic support and management of coronary artery disease) and alternatives of a cardiac catheterization were discussed in detail with Tammy Osborne and she is willing to proceed.      Risk Assessment/Risk Scores:     For questions or updates, please  contact [facility] Please consult [email or web address] for contact info under    Signed, Shona Shad, PA-C  08/17/2023 7:50 AM

## 2023-08-17 NOTE — Plan of Care (Signed)
  Problem: Education: Goal: Understanding of cardiac disease, CV risk reduction, and recovery process will improve Outcome: Progressing   Problem: Cardiac: Goal: Ability to achieve and maintain adequate cardiovascular perfusion will improve Outcome: Progressing   Problem: Clinical Measurements: Goal: Diagnostic test results will improve Outcome: Progressing   Problem: Pain Managment: Goal: General experience of comfort will improve and/or be controlled Outcome: Progressing

## 2023-08-17 NOTE — Plan of Care (Signed)

## 2023-08-17 NOTE — Progress Notes (Signed)
 [facility]  PROGRESS NOTE  Tammy Osborne FMW:[id number] DOB: [birth year] DOA: 08/15/2023 PCP: Vicci Barnie NOVAK, MD   Brief HPI:   68 y.o. female with medical history significant of DMII, HTN,HLD, who presents to ED with substernal chest pain with radiation to neck,jaw and shoulder that started today. Patient notes its worse with exertion and better with rest. She also notes associated n/diaphoresis.  Patient currently notes no chest pain currently and states she feels much improved.     Assessment/Plan:    Chest pain r/o ACS -initial ischemic work up with stable EKG , flat trop without significant delta  - history however concerning as pain is exertional and typical for angina pectoris -Cardiology consulted, plan for cardiac catheterization on Monday. - Echocardiogram obtained, showed EF of 65 to 70%, mild asymmetric LVH, left ventricular diastolic parameters are indeterminate    Hypertension  -stable  -resumed carvedilol  and amlodipine     DMII -patient denies hx of DMII - Hemoglobin A1c 6.2   Medications     amLODipine   10 mg Oral Daily   aspirin   324 mg Oral Once   aspirin  EC  81 mg Oral Daily   carvedilol   18.75 mg Oral BID   gabapentin   200 mg Oral BID   heparin   5,000 Units Subcutaneous Q8H   losartan   100 mg Oral Daily   pantoprazole   40 mg Oral Daily   rosuvastatin   5 mg Oral Daily     Data Reviewed:   CBG:  No results for input(s): GLUCAP in the last 168 hours.  SpO2: 95 %    Vitals:   08/16/23 2110 08/16/23 2142 08/17/23 0224 08/17/23 0529  BP: (!) 117/51 (!) 132/59 (!) 142/50 126/63  Pulse: (!) 58 (!) 59 (!) 58 (!) 59  Resp: 20  20 18   Temp: 97.6 F (36.4 C)  99.1 F (37.3 C) 97.8 F (36.6 C)  TempSrc: Oral  Oral Oral  SpO2: 97%  96% 95%  Weight:      Height:          Data Reviewed:  Basic Metabolic Panel: Recent Labs  Lab 08/15/23 1921 08/16/23 0336  NA 135  --   K 3.9  --   CL 104  --   CO2 19*  --   GLUCOSE  203*  --   BUN 23  --   CREATININE 0.83 0.58  CALCIUM  9.5  --     CBC: Recent Labs  Lab 08/15/23 1921 08/16/23 0336  WBC 9.2 14.1*  HGB 14.6 13.6  HCT 46.1* 41.6  MCV 83.1 81.4  PLT 362 338    LFT No results for input(s): AST, ALT, ALKPHOS, BILITOT, PROT, ALBUMIN in the last 168 hours.   Antibiotics: Anti-infectives (From admission, onward)    None        DVT prophylaxis: Heparin   Code Status: Full code  Family Communication:    CONSULTS    Subjective   Denies chest pain   Objective    Physical Examination:  General-appears in no acute distress Heart-S1-S2, regular, no murmur auscultated Lungs-clear to auscultation bilaterally, no wheezing or crackles auscultated Abdomen-soft, nontender, no organomegaly Extremities-no edema in the lower extremities Neuro-alert, oriented x3, no focal deficit noted  Status is: Inpatient:             Tammy Osborne   [facility] If 7PM-7AM, please contact night-coverage at [email or web address], Office  [phone]   08/17/2023, 7:58 AM  LOS: 1 day

## 2023-08-17 NOTE — Plan of Care (Signed)
  Problem: Education: Goal: Understanding of cardiac disease, CV risk reduction, and recovery process will improve Outcome: Progressing   Problem: Clinical Measurements: Goal: Cardiovascular complication will be avoided Outcome: Progressing   Problem: Coping: Goal: Level of anxiety will decrease Outcome: Progressing

## 2023-08-18 ENCOUNTER — Other Ambulatory Visit: Payer: Self-pay

## 2023-08-18 DIAGNOSIS — I200 Unstable angina: Secondary | ICD-10-CM

## 2023-08-18 LAB — PROTIME-INR
INR: 0.9 (ref 0.8–1.2)
Prothrombin Time: 13.1 s (ref 11.4–15.2)

## 2023-08-18 LAB — TROPONIN I (HIGH SENSITIVITY): Troponin I (High Sensitivity): 53 ng/L — ABNORMAL HIGH (ref <18)

## 2023-08-18 LAB — HEPARIN LEVEL (UNFRACTIONATED): Heparin Unfractionated: 0.38 [IU]/mL (ref 0.30–0.70)

## 2023-08-18 LAB — APTT: aPTT: 36 s (ref 24–36)

## 2023-08-18 MED ORDER — SODIUM CHLORIDE 0.9 % WEIGHT BASED INFUSION
1.0000 mL/kg/h | INTRAVENOUS | Status: DC
  Administered 2023-08-19: 1 mL/kg/h via INTRAVENOUS

## 2023-08-18 MED ORDER — ASPIRIN 81 MG PO CHEW
81.0000 mg | CHEWABLE_TABLET | ORAL | Status: AC
  Administered 2023-08-19: 81 mg via ORAL
  Filled 2023-08-18: qty 1

## 2023-08-18 MED ORDER — ALUM & MAG HYDROXIDE-SIMETH 200-200-20 MG/5ML PO SUSP
15.0000 mL | ORAL | Status: DC | PRN
  Administered 2023-08-18: 15 mL via ORAL
  Filled 2023-08-18: qty 30

## 2023-08-18 MED ORDER — HEPARIN (PORCINE) 25000 UT/250ML-% IV SOLN
650.0000 [IU]/h | INTRAVENOUS | Status: DC
  Administered 2023-08-18: 650 [IU]/h via INTRAVENOUS
  Filled 2023-08-18: qty 250

## 2023-08-18 MED ORDER — SODIUM CHLORIDE 0.9 % WEIGHT BASED INFUSION: 3.0000 mL/kg/h | INTRAVENOUS | Status: AC

## 2023-08-18 NOTE — Progress Notes (Signed)
 [facility]  PROGRESS NOTE  Tammy Osborne FMW:[id number] DOB: 1955-05-06 DOA: 08/15/2023 PCP: Vicci Barnie NOVAK, MD   Brief HPI:   68 y.o. female with medical history significant of DMII, HTN,HLD, who presents to ED with substernal chest pain with radiation to neck,jaw and shoulder that started today. Patient notes its worse with exertion and better with rest. She also notes associated n/diaphoresis.  Patient currently notes no chest pain currently and states she feels much improved.     Assessment/Plan:    Chest pain r/o ACS -initial ischemic work up with stable EKG , flat trop without significant delta  - history however concerning as pain is exertional and typical for angina pectoris -Cardiology consulted, plan for cardiac catheterization - Echocardiogram obtained, showed EF of 65 to 70%, mild asymmetric LVH, left ventricular diastolic parameters are indeterminate - Plan for cardiac cath on Monday, at [facility].    Hypertension  -stable  -resumed carvedilol  and amlodipine     DMII -patient denies hx of DMII - Hemoglobin A1c 6.2   Medications     amLODipine   10 mg Oral Daily   aspirin   324 mg Oral Once   aspirin  EC  81 mg Oral Daily   carvedilol   18.75 mg Oral BID   gabapentin   200 mg Oral BID   heparin   5,000 Units Subcutaneous Q8H   losartan   100 mg Oral Daily   pantoprazole   40 mg Oral Daily   rosuvastatin   5 mg Oral Daily     Data Reviewed:   CBG:  No results for input(s): GLUCAP in the last 168 hours.  SpO2: 97 %    Vitals:   08/17/23 2007 08/17/23 2128 08/17/23 2218 08/18/23 0453  BP: (!) 113/44  (!) 110/52 (!) 123/57  Pulse: 63 69  64  Resp: 18  18 18   Temp: 98.3 F (36.8 C)  98.4 F (36.9 C) 98.3 F (36.8 C)  TempSrc: Oral  Oral Oral  SpO2: 95%  99% 97%  Weight:      Height:          Data Reviewed:  Basic Metabolic Panel: Recent Labs  Lab 08/15/23 1921 08/16/23 0336  NA 135  --   K 3.9  --   CL 104  --    CO2 19*  --   GLUCOSE 203*  --   BUN 23  --   CREATININE 0.83 0.58  CALCIUM  9.5  --     CBC: Recent Labs  Lab 08/15/23 1921 08/16/23 0336  WBC 9.2 14.1*  HGB 14.6 13.6  HCT 46.1* 41.6  MCV 83.1 81.4  PLT 362 338    LFT No results for input(s): AST, ALT, ALKPHOS, BILITOT, PROT, ALBUMIN in the last 168 hours.   Antibiotics: Anti-infectives (From admission, onward)    None        DVT prophylaxis: Heparin   Code Status: Full code  Family Communication:    CONSULTS    Subjective   Developed chest pain last night.  Resolved with sublingual nitroglycerin .  EKG showed subtle ST-T changes.  Started on heparin  per cardiology this morning.   Objective    Physical Examination:  General-appears in no acute distress Heart-S1-S2, regular, no murmur auscultated Lungs-clear to auscultation bilaterally, no wheezing or crackles auscultated Abdomen-soft, nontender, no organomegaly Extremities-no edema in the lower extremities Neuro-alert, oriented x3, no focal deficit noted  Status is: Inpatient:             [place]  [facility] If 7PM-7AM, please contact night-coverage at [email or web address], Office  [phone]   08/18/2023, 8:11 AM  LOS: 2 days

## 2023-08-18 NOTE — Progress Notes (Signed)
 PHARMACY - ANTICOAGULATION CONSULT NOTE  Pharmacy Consult for IV Heparin  Indication: chest pain/ACS  No Known Allergies  Patient Measurements: Height: 4' 10 (147.3 cm) Weight: 61 kg (134 lb 7.7 oz) IBW/kg (Calculated) : 40.9 HEPARIN  DW (KG): 54.1  Vital Signs: Temp: 98.3 F (36.8 C) (07/20 0453) Temp Source: Oral (07/20 0453) BP: 123/57 (07/20 0453) Pulse Rate: 64 (07/20 0453)  Labs: Recent Labs    08/15/23 1921 08/15/23 2240 08/16/23 0336 08/16/23 0541 08/17/23 2236 08/18/23 0510  HGB 14.6  --  13.6  --   --   --   HCT 46.1*  --  41.6  --   --   --   PLT 362  --  338  --   --   --   CREATININE 0.83  --  0.58  --   --   --   TROPONINIHS 7   < > 34* 49* 56* 53*   < > = values in this interval not displayed.    Estimated Creatinine Clearance: 52.7 mL/min (by C-G formula based on SCr of 0.58 mg/dL).   Medical History: Past Medical History:  Diagnosis Date   Diabetes mellitus without complication (HCC)    Hypertension     Medications:  No prior to admission anticoagulant medications listed. On heparin  5000 units subcutaneous every 8 hours, last dose 7/20 0535  Assessment: Pharmacy consulted to manage IV heparin  per Cardiology for chest pain/ACS.  Cardiac catheterization planned for tomorrow.     Goal of Therapy:  Heparin  level 0.3-0.7 units/ml Monitor platelets by anticoagulation protocol: Yes   Plan:  Discontinue subcutaneous heparin  Initiate IV heparin  continuous infusion at 650 units/hr 6 hour heparin  level for titration Monitor daily heparin  level when within goal range, CBC, signs/symptoms of bleeding   Thank you for allowing pharmacy to be a part of this patient's care.  Eleanor EMERSON Agent, PharmD, BCPS Clinical Pharmacist [place] [facility] 08/18/2023 10:06 AM

## 2023-08-18 NOTE — Progress Notes (Signed)
  Progress Note  Patient Name: Tammy Osborne Date of Encounter: 08/18/2023 [facility] Cardiologist: None   Interval Summary   Had angina again last night, at rest, resolved with SL NTG.  ECG w subtle ST-T changes I and aVL, less severe than on admission  Vital Signs Vitals:   08/17/23 2007 08/17/23 2128 08/17/23 2218 08/18/23 0453  BP: (!) 113/44  (!) 110/52 (!) 123/57  Pulse: 63 69  64  Resp: 18  18 18   Temp: 98.3 F (36.8 C)  98.4 F (36.9 C) 98.3 F (36.8 C)  TempSrc: Oral  Oral Oral  SpO2: 95%  99% 97%  Weight:      Height:       No intake or output data in the 24 hours ending 08/18/23 0856    08/15/2023    8:54 PM 07/18/2023   10:11 AM 06/27/2023    3:15 PM  Last 3 Weights  Weight (lbs) 134 lb 7.7 oz 133 lb 6.4 oz 137 lb  Weight (kg) 61 kg 60.51 kg 62.143 kg      Telemetry/ECG  NSR - Personally Reviewed  Physical Exam  GEN: No acute distress.   Neck: No JVD Cardiac: RRR, no murmurs, rubs, or gallops.  Respiratory: Clear to auscultation bilaterally. GI: Soft, nontender, non-distended  MS: No edema  Assessment & Plan  For cardiac catheterization tomorrow, expect around 12 PM at [facility]. Will start IV heparin  with recurrent symptoms. Informed Consent   Shared Decision Making/Informed Consent The risks [stroke (1 in 1000), death (1 in 1000), kidney failure [usually temporary] (1 in 500), bleeding (1 in 200), allergic reaction [possibly serious] (1 in 200)], benefits (diagnostic support and management of coronary artery disease) and alternatives of a cardiac catheterization were discussed in detail with Tammy Osborne and she is willing to proceed.       For questions or updates, please contact [facility] Please consult [email or web address] for contact info under       Signed, Jerel Balding, MD

## 2023-08-18 NOTE — Progress Notes (Addendum)
 Pt  reported central chest pain radiating to the right jaw and shoulder.   Intervention:  2 doses Nitroglycerin  administered 5 mins    apart. 3rd dose was not administered pt verbalized having no pain after 2nd dose of Nitroglycerin   MD Mansy, paged, Vanessa CN at bedside  Verbal order for STAT Troponin 2 occurences  2L [place]  EKG completed and placed in chart  See chart for Vital signs   Pt reassessed verbalized having no chest pain or pressure. No signs of distress observed at this time. Pt resting comfortably in bed. MD Mansy updated post interventions. No new orders at this time. Will continue with plan of care.

## 2023-08-18 NOTE — Progress Notes (Signed)
 Pt c/o 2/10 chest pain that she reports began after eating lunch. She states it is mid-sternal and burning and feels similar to the pain that brought her to the hospital, but not as intense. She says the pain is not radiating at this time and she denies SOB, diaphoresis, nausea or other symptoms. This is the only complaint of chest pain today. VS stable. Dr. Drusilla notified. Order for Maalox received and administered to pt.

## 2023-08-18 NOTE — Plan of Care (Signed)

## 2023-08-18 NOTE — Progress Notes (Signed)
 PHARMACY - ANTICOAGULATION CONSULT NOTE  Pharmacy Consult for IV Heparin  Indication: chest pain/ACS  No Known Allergies  Patient Measurements: Height: 4' 10 (147.3 cm) Weight: 61 kg (134 lb 7.7 oz) IBW/kg (Calculated) : 40.9 HEPARIN  DW (KG): 54.1  Vital Signs: Temp: 97.8 F (36.6 C) (07/20 1545) Temp Source: Oral (07/20 1545) BP: 135/57 (07/20 1545) Pulse Rate: 58 (07/20 1545)  Labs: Recent Labs    08/15/23 1921 08/15/23 2240 08/16/23 0336 08/16/23 0541 08/17/23 2236 08/18/23 0510 08/18/23 0919 08/18/23 1639  HGB 14.6  --  13.6  --   --   --   --   --   HCT 46.1*  --  41.6  --   --   --   --   --   PLT 362  --  338  --   --   --   --   --   APTT  --   --   --   --   --   --  36  --   LABPROT  --   --   --   --   --   --  13.1  --   INR  --   --   --   --   --   --  0.9  --   HEPARINUNFRC  --   --   --   --   --   --   --  0.38  CREATININE 0.83  --  0.58  --   --   --   --   --   TROPONINIHS 7   < > 34* 49* 56* 53*  --   --    < > = values in this interval not displayed.    Estimated Creatinine Clearance: 52.7 mL/min (by C-G formula based on SCr of 0.58 mg/dL).   Medical History: Past Medical History:  Diagnosis Date   Diabetes mellitus without complication (HCC)    Hypertension     Medications:  No prior to admission anticoagulant medications listed. On heparin  5000 units subcutaneous every 8 hours, last dose 7/20 0535  Assessment: Pharmacy consulted to manage IV heparin  per Cardiology for chest pain/ACS.  Cardiac catheterization planned for tomorrow.    08/18/2023 First heparin  level = 0.38  - therapeutic after heparin  drip started at 650 units/hr No bleeding reported Per cardiology notes plan is cardiac cath at [facility] tomorrow around 12 noon   Goal of Therapy:  Heparin  level 0.3-0.7 units/ml Monitor platelets by anticoagulation protocol: Yes   Plan:  continue IV heparin  continuous infusion at 650 units/hr Check 6 hour confirmatory heparin  level  tonight Monitor daily heparin  level when within goal range, CBC, signs/symptoms of bleeding F/u anticoag plans after cardiac cath on 7/21   Rosaline IVAR Edison, Pharm.D Use secure chat for questions 08/18/2023 5:38 PM    Thank you for allowing pharmacy to be a part of this patient's care.

## 2023-08-19 ENCOUNTER — Encounter (HOSPITAL_COMMUNITY): Payer: Self-pay | Admitting: Cardiology

## 2023-08-19 ENCOUNTER — Encounter (HOSPITAL_COMMUNITY)
Admission: EM | Disposition: A | Discharge status: Home / Self Care | Payer: Self-pay | Source: Home / Self Care | Attending: Family Medicine

## 2023-08-19 DIAGNOSIS — I10 Essential (primary) hypertension: Secondary | ICD-10-CM

## 2023-08-19 DIAGNOSIS — I25110 Atherosclerotic heart disease of native coronary artery with unstable angina pectoris: Secondary | ICD-10-CM

## 2023-08-19 HISTORY — PX: CORONARY STENT INTERVENTION: CATH118234

## 2023-08-19 HISTORY — PX: LEFT HEART CATH AND CORONARY ANGIOGRAPHY: CATH118249

## 2023-08-19 LAB — CBC
HCT: 40.1 % (ref 36.0–46.0)
Hemoglobin: 12.6 g/dL (ref 12.0–15.0)
MCH: 26.4 pg (ref 26.0–34.0)
MCHC: 31.4 g/dL (ref 30.0–36.0)
MCV: 83.9 fL (ref 80.0–100.0)
Platelets: 311 K/uL (ref 150–400)
RBC: 4.78 MIL/uL (ref 3.87–5.11)
RDW: 14.7 % (ref 11.5–15.5)
WBC: 11 K/uL — ABNORMAL HIGH (ref 4.0–10.5)
nRBC: 0 % (ref 0.0–0.2)

## 2023-08-19 LAB — POCT ACTIVATED CLOTTING TIME: Activated Clotting Time: 268 s

## 2023-08-19 LAB — HEPARIN LEVEL (UNFRACTIONATED)
Heparin Unfractionated: 0.6 [IU]/mL (ref 0.30–0.70)
Heparin Unfractionated: 0.49 [IU]/mL (ref 0.30–0.70)

## 2023-08-19 LAB — GLUCOSE, CAPILLARY: Glucose-Capillary: 112 mg/dL — ABNORMAL HIGH (ref 70–99)

## 2023-08-19 MED ORDER — SODIUM CHLORIDE 0.9% FLUSH
3.0000 mL | Freq: Two times a day (BID) | INTRAVENOUS | Status: DC
  Administered 2023-08-19: 3 mL via INTRAVENOUS

## 2023-08-19 MED ORDER — VERAPAMIL HCL 2.5 MG/ML IV SOLN
INTRAVENOUS | Status: DC | PRN
  Administered 2023-08-19: 10 mL via INTRA_ARTERIAL

## 2023-08-19 MED ORDER — LABETALOL HCL 5 MG/ML IV SOLN: 10.0000 mg | INTRAVENOUS | Status: AC | PRN

## 2023-08-19 MED ORDER — TICAGRELOR 90 MG PO TABS
ORAL_TABLET | ORAL | Status: AC
Start: 2023-08-19 — End: 2023-08-19
  Filled 2023-08-19: qty 2

## 2023-08-19 MED ORDER — HEPARIN (PORCINE) IN NACL 1000-0.9 UT/500ML-% IV SOLN
INTRAVENOUS | Status: DC | PRN
  Administered 2023-08-19: 1000 mL via SURGICAL_CAVITY

## 2023-08-19 MED ORDER — HYDRALAZINE HCL 20 MG/ML IJ SOLN: 10.0000 mg | INTRAMUSCULAR | Status: AC | PRN

## 2023-08-19 MED ORDER — MIDAZOLAM HCL 2 MG/2ML IJ SOLN
INTRAMUSCULAR | Status: DC | PRN
  Administered 2023-08-19 (×2): 1 mg via INTRAVENOUS

## 2023-08-19 MED ORDER — SODIUM CHLORIDE 0.9 % IV SOLN: INTRAVENOUS | Status: AC

## 2023-08-19 MED ORDER — VERAPAMIL HCL 2.5 MG/ML IV SOLN
INTRAVENOUS | Status: AC
  Filled 2023-08-19: qty 2

## 2023-08-19 MED ORDER — NITROGLYCERIN 1 MG/10 ML FOR IR/CATH LAB
INTRA_ARTERIAL | Status: DC | PRN
  Administered 2023-08-19 (×2): 200 ug via INTRACORONARY

## 2023-08-19 MED ORDER — TICAGRELOR 90 MG PO TABS
ORAL_TABLET | ORAL | Status: DC | PRN
  Administered 2023-08-19: 180 mg via ORAL

## 2023-08-19 MED ORDER — TICAGRELOR 90 MG PO TABS
90.0000 mg | ORAL_TABLET | Freq: Two times a day (BID) | ORAL | Status: DC
  Administered 2023-08-19 – 2023-08-20 (×2): 90 mg via ORAL
  Filled 2023-08-19 (×2): qty 1

## 2023-08-19 MED ORDER — IOHEXOL 350 MG/ML SOLN
INTRAVENOUS | Status: DC | PRN
  Administered 2023-08-19: 55 mL

## 2023-08-19 MED ORDER — FENTANYL CITRATE (PF) 100 MCG/2ML IJ SOLN
INTRAMUSCULAR | Status: AC
Start: 2023-08-19 — End: 2023-08-19
  Filled 2023-08-19: qty 2

## 2023-08-19 MED ORDER — MIDAZOLAM HCL 2 MG/2ML IJ SOLN
INTRAMUSCULAR | Status: AC
  Filled 2023-08-19: qty 2

## 2023-08-19 MED ORDER — HEPARIN SODIUM (PORCINE) 1000 UNIT/ML IJ SOLN
INTRAMUSCULAR | Status: DC | PRN
  Administered 2023-08-19 (×2): 3500 [IU] via INTRAVENOUS

## 2023-08-19 MED ORDER — HEPARIN SODIUM (PORCINE) 1000 UNIT/ML IJ SOLN
INTRAMUSCULAR | Status: AC
  Filled 2023-08-19: qty 10

## 2023-08-19 MED ORDER — NITROGLYCERIN 1 MG/10 ML FOR IR/CATH LAB
INTRA_ARTERIAL | Status: AC
  Filled 2023-08-19: qty 10

## 2023-08-19 MED ORDER — FENTANYL CITRATE (PF) 100 MCG/2ML IJ SOLN
INTRAMUSCULAR | Status: DC | PRN
  Administered 2023-08-19 (×2): 25 ug via INTRAVENOUS

## 2023-08-19 MED ORDER — SODIUM CHLORIDE 0.9% FLUSH: 3.0000 mL | INTRAVENOUS | Status: DC | PRN

## 2023-08-19 MED ORDER — LIDOCAINE HCL (PF) 1 % IJ SOLN
INTRAMUSCULAR | Status: DC | PRN
  Administered 2023-08-19: 2 mL

## 2023-08-19 MED ORDER — SODIUM CHLORIDE 0.9 % IV SOLN: 250.0000 mL | INTRAVENOUS | Status: DC | PRN

## 2023-08-19 MED ORDER — LIDOCAINE HCL (PF) 1 % IJ SOLN
INTRAMUSCULAR | Status: AC
  Filled 2023-08-19: qty 30

## 2023-08-19 NOTE — H&P (View-Only) (Signed)
  Progress Note  Patient Name: Tammy Osborne Date of Encounter: 08/19/2023 [facility] Cardiologist: None   Interval Summary   Had angina again last night, with movement, resolved with SL NTG.  Going for cath today.  Reviewed CP and cath with spanish interpreter Hendrick present through IPAD.   Vital Signs Vitals:   08/18/23 2208 08/18/23 2210 08/18/23 2223 08/19/23 0300  BP: (!) 139/59 (!) 139/59 (!) 110/57   Pulse: 63 63 (!) 54   Resp:      Temp: 98.4 F (36.9 C) 98.4 F (36.9 C)  98.4 F (36.9 C)  TempSrc: Oral Oral  Oral  SpO2: 96% 97% 100%   Weight:      Height:        Intake/Output Summary (Last 24 hours) at 08/19/2023 0931 Last data filed at 08/19/2023 0800 Gross per 24 hour  Intake 36.68 ml  Output --  Net 36.68 ml      08/15/2023    8:54 PM 07/18/2023   10:11 AM 06/27/2023    3:15 PM  Last 3 Weights  Weight (lbs) 134 lb 7.7 oz 133 lb 6.4 oz 137 lb  Weight (kg) 61 kg 60.51 kg 62.143 kg      Telemetry/ECG  NSR - Personally Reviewed  Physical Exam  GEN: No acute distress.   Neck: No JVD Cardiac: RRR, no murmurs, rubs, or gallops.  Respiratory: Clear to auscultation bilaterally. GI: Soft, nontender, non-distended  MS: No edema  Assessment & Plan  #angina pectoris with minimal elevated troponin, NSTEMI #HTN - 3V cor cals on CT chest from 2022, severe aortic atherosclerosis. Hx of DM with A1c 6.2%. - IV heparin  - SL ntg with cp - asa 81 mg daily - crestor  5 mg daily - also on carvedilol  18.75 mg BID, losartan  100 mg daily, amlodipine  10 mg daily.    For questions or updates, please contact [facility] Please consult [email or web address] for contact info under       Signed, Kimarion Chery A Jevan Gaunt, MD

## 2023-08-19 NOTE — Progress Notes (Signed)
  Progress Note  Patient Name: Tammy Osborne Date of Encounter: 08/19/2023 [facility] Cardiologist: None   Interval Summary   Had angina again last night, with movement, resolved with SL NTG.  Going for cath today.  Reviewed CP and cath with spanish interpreter Hendrick present through IPAD.   Vital Signs Vitals:   08/18/23 2208 08/18/23 2210 08/18/23 2223 08/19/23 0300  BP: (!) 139/59 (!) 139/59 (!) 110/57   Pulse: 63 63 (!) 54   Resp:      Temp: 98.4 F (36.9 C) 98.4 F (36.9 C)  98.4 F (36.9 C)  TempSrc: Oral Oral  Oral  SpO2: 96% 97% 100%   Weight:      Height:        Intake/Output Summary (Last 24 hours) at 08/19/2023 0931 Last data filed at 08/19/2023 0800 Gross per 24 hour  Intake 36.68 ml  Output --  Net 36.68 ml      08/15/2023    8:54 PM 07/18/2023   10:11 AM 06/27/2023    3:15 PM  Last 3 Weights  Weight (lbs) 134 lb 7.7 oz 133 lb 6.4 oz 137 lb  Weight (kg) 61 kg 60.51 kg 62.143 kg      Telemetry/ECG  NSR - Personally Reviewed  Physical Exam  GEN: No acute distress.   Neck: No JVD Cardiac: RRR, no murmurs, rubs, or gallops.  Respiratory: Clear to auscultation bilaterally. GI: Soft, nontender, non-distended  MS: No edema  Assessment & Plan  #angina pectoris with minimal elevated troponin, NSTEMI #HTN - 3V cor cals on CT chest from 2022, severe aortic atherosclerosis. Hx of DM with A1c 6.2%. - IV heparin  - SL ntg with cp - asa 81 mg daily - crestor  5 mg daily - also on carvedilol  18.75 mg BID, losartan  100 mg daily, amlodipine  10 mg daily.    For questions or updates, please contact [facility] Please consult [email or web address] for contact info under       Signed, Kimarion Chery A Jevan Gaunt, MD

## 2023-08-19 NOTE — Interval H&P Note (Signed)
 History and Physical Interval Note:  08/19/2023 12:57 PM  Tammy Osborne  has presented today for surgery, with the diagnosis of unstable angina.  The various methods of treatment have been discussed with the patient and family. After consideration of risks, benefits and other options for treatment, the patient has consented to  Procedure(s): LEFT HEART CATH AND CORONARY ANGIOGRAPHY (N/A) as a surgical intervention.  The patient's history has been reviewed, patient examined, no change in status, stable for surgery.  I have reviewed the patient's chart and labs.  Questions were answered to the patient's satisfaction.     [name]

## 2023-08-19 NOTE — Plan of Care (Signed)

## 2023-08-19 NOTE — Plan of Care (Signed)
  Problem: Education: Goal: Understanding of cardiac disease, CV risk reduction, and recovery process will improve Outcome: Progressing   Problem: Activity: Goal: Ability to tolerate increased activity will improve Outcome: Progressing   Problem: Cardiac: Goal: Ability to achieve and maintain adequate cardiovascular perfusion will improve Outcome: Progressing   Problem: Health Behavior/Discharge Planning: Goal: Ability to manage health-related needs will improve Outcome: Progressing   Problem: Clinical Measurements: Goal: Ability to maintain clinical measurements within normal limits will improve Outcome: Progressing Goal: Cardiovascular complication will be avoided Outcome: Progressing   Problem: Activity: Goal: Risk for activity intolerance will decrease Outcome: Progressing   Problem: Pain Managment: Goal: General experience of comfort will improve and/or be controlled Outcome: Progressing   Problem: Safety: Goal: Ability to remain free from injury will improve Outcome: Progressing   Problem: Cardiovascular: Goal: Ability to achieve and maintain adequate cardiovascular perfusion will improve Outcome: Progressing Goal: Vascular access site(s) Level 0-1 will be maintained Outcome: Progressing

## 2023-08-19 NOTE — Progress Notes (Signed)
 Patient reported central chest pressure/pain radiating to the jaw. Nitroglycerin  administered x 2, EKG complete SR, SB, 2L [place]. Vital signs unstable after second dose of nitroglycerin  decreased in Bp - 94/71 Map 64 from Bp-139/59 patient symptomatic  nausea, light headedness, and diaphoretic. Patient placed and in trendelenburg position Bp reassessed 110/57 pt is asymptomatic and no signs of distress. CN at bedside, provider notified.  No new orders at this time will continue with plan of care.

## 2023-08-19 NOTE — Progress Notes (Addendum)
 [facility] called for pickup of patient to transport her to [facility] heart catherization lab. Heart catherization is scheduled for 12 noon. [facility] will pick up patient at 11am. Sending provider is Dr. Jerel Balding and Receiving provider is Dr. Patwardan.

## 2023-08-19 NOTE — Progress Notes (Signed)
   08/19/23 0922  TOC Brief Assessment  Insurance and Status Reviewed  Patient has primary care physician Yes  Home environment has been reviewed single family home  Prior level of function: independent  Prior/Current Home Services No current home services  Social Drivers of Health Review SDOH reviewed no interventions necessary  Readmission risk has been reviewed Yes  Transition of care needs no transition of care needs at this time    Heather Saltness, MSW, LCSW 08/19/2023 9:22 AM

## 2023-08-19 NOTE — Progress Notes (Signed)
 [facility]                                                                               Tammy Osborne, is a 68 y.o. female, DOB - 08-12-1955, FMW:[id number] Admit date - 08/15/2023    Outpatient Primary MD for the patient is Vicci Barnie NOVAK, MD  LOS - 3  days    Brief summary   67 y.o. female with medical history significant of DMII, HTN,HLD, who presents to ED with substernal chest pain with radiation to neck,jaw and shoulder that started today. Patient notes its worse with exertion and better with rest. She also notes associated n/diaphoresis.  Patient currently notes no chest pain currently and states she feels much improved.    Assessment & Plan    Assessment and Plan:   Exertional Chest pain  EKG no ischemic changes.  Cardiology on board.  Plan for cath later today.  Echo reviewed.     Hypertension Well controlled.    Type 2 DM CBG (last 3)  Recent Labs    08/19/23 1418  GLUCAP 112*   Resume SSI.     Estimated body mass index is 28.11 kg/m as calculated from the following:   Height as of this encounter: 4' 10 (1.473 m).   Weight as of this encounter: 61 kg.  Code Status: full code.  DVT Prophylaxis:  heparin .    Level of Care: Level of care: Progressive Family Communication: none at bedside.   Disposition Plan:     Remains inpatient appropriate:  pending.   Procedures:  Cath later today.   Consultants:   cardiology  Antimicrobials:   Anti-infectives (From admission, onward)    None        Medications  Scheduled Meds:  [MAR Hold] amLODipine   10 mg Oral Daily   [MAR Hold] aspirin   324 mg Oral Once   [MAR Hold] aspirin  EC  81 mg Oral Daily   [MAR Hold] carvedilol   18.75 mg Oral BID   [MAR Hold] gabapentin   200 mg Oral BID   [MAR Hold] losartan   100 mg Oral Daily   nitroGLYCERIN        [MAR Hold] pantoprazole   40 mg Oral Daily   [MAR Hold] rosuvastatin   5 mg Oral Daily   ticagrelor   90 mg Oral BID    Continuous Infusions:  sodium chloride  100 mL/hr at 08/19/23 1423   sodium chloride  1 mL/kg/hr (08/19/23 1156)   heparin  Stopped (08/19/23 1155)   PRN Meds:.[MAR Hold] acetaminophen , [MAR Hold] alum & mag hydroxide-simeth, fentaNYL , Heparin  (Porcine) in NaCl, heparin  sodium (porcine), iohexol , lidocaine  (PF), midazolam , [MAR Hold] nitroGLYCERIN , nitroGLYCERIN , nitroGLYCERIN , [MAR Hold] ondansetron  (ZOFRAN ) IV, Radial Cocktail/Verapamil  only, ticagrelor     Subjective:   Tammy Osborne was seen and examined today.  No  Chest pain.   Objective:   Vitals:   08/19/23 1545 08/19/23 1600 08/19/23 1615 08/19/23 1630  BP: (!) 88/67  (!) 116/31 (!) 120/51  Pulse: 65 61 63 66  Resp: 16 15 16 18   Temp:      TempSrc:      SpO2: 96% 95% 97% 98%  Weight:  Height:        Intake/Output Summary (Last 24 hours) at 08/19/2023 1643 Last data filed at 08/19/2023 1156 Gross per 24 hour  Intake 259.05 ml  Output --  Net 259.05 ml   Filed Weights   08/15/23 2054  Weight: 61 kg     Exam General: Alert and oriented x 3, NAD Cardiovascular: S1 S2 auscultated, no murmurs, RRR Respiratory: Clear to auscultation bilaterally, no wheezing, rales or rhonchi Gastrointestinal: Soft, nontender, nondistended, + bowel sounds Ext: no pedal edema bilaterally Neuro: AAOx3, Cr N's II- XII. Strength 5/5 upper and lower extremities bilaterally Skin: No rashes Psych: Normal affect and demeanor, alert and oriented x3    Data Reviewed:  I have personally reviewed following labs and imaging studies   CBC Lab Results  Component Value Date   WBC 11.0 (H) 08/19/2023   RBC 4.78 08/19/2023   HGB 12.6 08/19/2023   HCT 40.1 08/19/2023   MCV 83.9 08/19/2023   MCH 26.4 08/19/2023   PLT 311 08/19/2023   MCHC 31.4 08/19/2023   RDW 14.7 08/19/2023   LYMPHSABS 5.0 (H) 11/07/2022   MONOABS 1.0 11/07/2022   EOSABS 0.0 11/07/2022   BASOSABS 0.0 11/07/2022     Last metabolic panel Lab Results   Component Value Date   NA 135 08/15/2023   K 3.9 08/15/2023   CL 104 08/15/2023   CO2 19 (L) 08/15/2023   BUN 23 08/15/2023   CREATININE 0.58 08/16/2023   GLUCOSE 203 (H) 08/15/2023   GFRNONAA >60 08/16/2023   GFRAA 108 02/03/2019   CALCIUM  9.5 08/15/2023   PROT 7.2 11/07/2022   ALBUMIN 4.6 11/07/2022   LABGLOB 2.6 11/07/2021   AGRATIO 1.8 11/07/2021   BILITOT 0.3 11/07/2022   ALKPHOS 116 11/07/2022   AST 14 11/07/2022   ALT 14 11/07/2022   ANIONGAP 12 08/15/2023    CBG (last 3)  Recent Labs    08/19/23 1418  GLUCAP 112*      Coagulation Profile: Recent Labs  Lab 08/18/23 0919  INR 0.9     Radiology Studies: CARDIAC CATHETERIZATION Result Date: 08/19/2023 Images from the original result were not included. Coronary angiography and intervention 08/19/2023: LM: Normal LAD: Mid 20% disease Lcx: Mid 20% disease RCA: Prox 95% stenosis LVEDP 19 mmHg Successful percutaneous coronary intervention prox RCA        PTCA and stent placement 4.0 X 20 mm Synergy drug-eluting stent        Post dilated with 4.0 X 12 mm [place] balloon up to 20 atm        0% residual stenosis, TIMI flow I-->III Recommend DAPT for at least 1 year If patient has cost issues with Brlinta, okay to switch to Effient or Plavix Newman JINNY Lawrence, MD        Elgie Butter M.D. [facility] 08/19/2023, 4:43 PM  Available via Epic secure chat 7am-7pm After 7 pm, please refer to night coverage provider listed on amion.

## 2023-08-19 NOTE — Progress Notes (Signed)
 PHARMACY - ANTICOAGULATION CONSULT NOTE  Pharmacy Consult for IV Heparin  Indication: chest pain/ACS  No Known Allergies  Patient Measurements: Height: 4' 10 (147.3 cm) Weight: 61 kg (134 lb 7.7 oz) IBW/kg (Calculated) : 40.9 HEPARIN  DW (KG): 54.1  Vital Signs: Temp: 98.4 F (36.9 C) (07/21 0300) Temp Source: Oral (07/21 0300) BP: 110/57 (07/20 2223) Pulse Rate: 54 (07/20 2223)  Labs: Recent Labs    08/17/23 2236 08/18/23 0510 08/18/23 0919 08/18/23 1639 08/18/23 2257 08/19/23 0458  HGB  --   --   --   --   --  12.6  HCT  --   --   --   --   --  40.1  PLT  --   --   --   --   --  311  APTT  --   --  36  --   --   --   LABPROT  --   --  13.1  --   --   --   INR  --   --  0.9  --   --   --   HEPARINUNFRC  --   --   --  0.38 0.60 0.49  TROPONINIHS 56* 53*  --   --   --   --     Estimated Creatinine Clearance: 52.7 mL/min (by C-G formula based on SCr of 0.58 mg/dL).   Medical History: Past Medical History:  Diagnosis Date   Diabetes mellitus without complication (HCC)    Hypertension     Medications:  No prior to admission anticoagulant medications listed. On heparin  5000 units subcutaneous every 8 hours, last dose 7/20 0535  Assessment: Pharmacy consulted to manage IV heparin  per Cardiology for chest pain/ACS.  Cardiac catheterization planned for today.    08/19/2023 Heparin  level = 0.49  -remains therapeutic heparin  drip at 650 units/hr No bleeding or infusion related concerns reported by RN Per cardiology notes plan is cardiac cath at [facility] today around 12 noon  Goal of Therapy:  Heparin  level 0.3-0.7 units/ml Monitor platelets by anticoagulation protocol: Yes   Plan:  Continue IV heparin  continuous infusion at 650 units/hr Monitor daily heparin  & CBC while on heparin   Recardo Bras, PharmD Candidate 08/19/2023 9:32 AM

## 2023-08-19 NOTE — Progress Notes (Signed)
 PHARMACY - ANTICOAGULATION CONSULT NOTE  Pharmacy Consult for IV Heparin  Indication: chest pain/ACS  No Known Allergies  Patient Measurements: Height: 4' 10 (147.3 cm) Weight: 61 kg (134 lb 7.7 oz) IBW/kg (Calculated) : 40.9 HEPARIN  DW (KG): 54.1  Vital Signs: Temp: 98.4 F (36.9 C) (07/20 2210) Temp Source: Oral (07/20 2210) BP: 110/57 (07/20 2223) Pulse Rate: 54 (07/20 2223)  Labs: Recent Labs    08/16/23 0336 08/16/23 0541 08/17/23 2236 08/18/23 0510 08/18/23 0919 08/18/23 1639 08/18/23 2257  HGB 13.6  --   --   --   --   --   --   HCT 41.6  --   --   --   --   --   --   PLT 338  --   --   --   --   --   --   APTT  --   --   --   --  36  --   --   LABPROT  --   --   --   --  13.1  --   --   INR  --   --   --   --  0.9  --   --   HEPARINUNFRC  --   --   --   --   --  0.38 0.60  CREATININE 0.58  --   --   --   --   --   --   TROPONINIHS 34* 49* 56* 53*  --   --   --     Estimated Creatinine Clearance: 52.7 mL/min (by C-G formula based on SCr of 0.58 mg/dL).   Medical History: Past Medical History:  Diagnosis Date   Diabetes mellitus without complication (HCC)    Hypertension     Medications:  No prior to admission anticoagulant medications listed. On heparin  5000 units subcutaneous every 8 hours, last dose 7/20 0535  Assessment: Pharmacy consulted to manage IV heparin  per Cardiology for chest pain/ACS.  Cardiac catheterization planned for tomorrow.    08/19/2023 Heparin  level = 0.6  -remains therapeutic heparin  drip at 650 units/hr No bleeding or infusion related concerns reported by RN Per cardiology notes plan is cardiac cath at [facility] tomorrow around 12 noon  Goal of Therapy:  Heparin  level 0.3-0.7 units/ml Monitor platelets by anticoagulation protocol: Yes   Plan:  Continue IV heparin  continuous infusion at 650 units/hr Monitor daily heparin  & CBC while on bleeding F/u anticoag plans after cardiac cath on 7/21  Rosaline Millet, PharmD,  BCPS 08/19/2023 12:22 AM

## 2023-08-20 ENCOUNTER — Other Ambulatory Visit (HOSPITAL_COMMUNITY): Payer: Self-pay

## 2023-08-20 DIAGNOSIS — Z955 Presence of coronary angioplasty implant and graft: Secondary | ICD-10-CM

## 2023-08-20 DIAGNOSIS — I214 Non-ST elevation (NSTEMI) myocardial infarction: Secondary | ICD-10-CM

## 2023-08-20 LAB — CBC
HCT: 39.1 % (ref 36.0–46.0)
Hemoglobin: 12.7 g/dL (ref 12.0–15.0)
MCH: 26.7 pg (ref 26.0–34.0)
MCHC: 32.5 g/dL (ref 30.0–36.0)
MCV: 82.1 fL (ref 80.0–100.0)
Platelets: 290 K/uL (ref 150–400)
RBC: 4.76 MIL/uL (ref 3.87–5.11)
RDW: 14.8 % (ref 11.5–15.5)
WBC: 10.5 K/uL (ref 4.0–10.5)
nRBC: 0 % (ref 0.0–0.2)

## 2023-08-20 LAB — BASIC METABOLIC PANEL WITH GFR
Anion gap: 10 (ref 5–15)
BUN: 14 mg/dL (ref 8–23)
CO2: 23 mmol/L (ref 22–32)
Calcium: 9 mg/dL (ref 8.9–10.3)
Chloride: 104 mmol/L (ref 98–111)
Creatinine, Ser: 0.83 mg/dL (ref 0.44–1.00)
GFR, Estimated: >60 mL/min (ref >60)
Glucose, Bld: 110 mg/dL — ABNORMAL HIGH (ref 70–99)
Potassium: 3.7 mmol/L (ref 3.5–5.1)
Sodium: 137 mmol/L (ref 135–145)

## 2023-08-20 MED ORDER — NITROGLYCERIN 0.4 MG SL SUBL
0.4000 mg | SUBLINGUAL_TABLET | SUBLINGUAL | 0 refills | Status: DC | PRN
  Filled 2023-08-20: qty 25, 5d supply, fill #0

## 2023-08-20 MED ORDER — ROSUVASTATIN CALCIUM 20 MG PO TABS: 20.0000 mg | ORAL_TABLET | Freq: Every day | ORAL | Status: DC

## 2023-08-20 MED ORDER — TICAGRELOR 90 MG PO TABS
90.0000 mg | ORAL_TABLET | Freq: Two times a day (BID) | ORAL | 0 refills | Status: DC
  Filled 2023-08-20: qty 60, 30d supply, fill #0

## 2023-08-20 MED ORDER — ROSUVASTATIN CALCIUM 20 MG PO TABS
20.0000 mg | ORAL_TABLET | Freq: Every day | ORAL | 0 refills | Status: DC
  Filled 2023-08-20: qty 30, 30d supply, fill #0

## 2023-08-20 NOTE — Progress Notes (Signed)
 Patient ambulated 237ft in hallway unassisted on room air.  Oxygen saturations were 96%.  Patient had no complaints of shortness of breath or chest pain.  Patient resting in room sitting on the side of the bed.  Patient expresses no additional needs at this time.

## 2023-08-20 NOTE — Plan of Care (Signed)
  Problem: Education: Goal: Understanding of cardiac disease, CV risk reduction, and recovery process will improve Outcome: Adequate for Discharge Goal: Individualized Educational Video(s) Outcome: Adequate for Discharge   Problem: Activity: Goal: Ability to tolerate increased activity will improve Outcome: Adequate for Discharge   Problem: Cardiac: Goal: Ability to achieve and maintain adequate cardiovascular perfusion will improve Outcome: Adequate for Discharge   Problem: Health Behavior/Discharge Planning: Goal: Ability to safely manage health-related needs after discharge will improve Outcome: Adequate for Discharge   Problem: Education: Goal: Knowledge of General Education information will improve Description: Including pain rating scale, medication(s)/side effects and non-pharmacologic comfort measures 08/20/2023 1136 by Berkeley Verdie BRAVO, RN Outcome: Adequate for Discharge 08/20/2023 [id number] by Berkeley Verdie BRAVO, RN Outcome: Progressing   Problem: Health Behavior/Discharge Planning: Goal: Ability to manage health-related needs will improve 08/20/2023 1136 by Berkeley Verdie BRAVO, RN Outcome: Adequate for Discharge 08/20/2023 9177 by Berkeley Verdie BRAVO, RN Outcome: Progressing   Problem: Clinical Measurements: Goal: Ability to maintain clinical measurements within normal limits will improve Outcome: Adequate for Discharge Goal: Will remain free from infection Outcome: Adequate for Discharge Goal: Diagnostic test results will improve Outcome: Adequate for Discharge Goal: Respiratory complications will improve Outcome: Adequate for Discharge Goal: Cardiovascular complication will be avoided Outcome: Adequate for Discharge   Problem: Activity: Goal: Risk for activity intolerance will decrease 08/20/2023 1136 by Berkeley Verdie BRAVO, RN Outcome: Adequate for Discharge 08/20/2023 9177 by Berkeley Verdie BRAVO, RN Outcome: Progressing   Problem: Nutrition: Goal: Adequate nutrition will be  maintained Outcome: Adequate for Discharge   Problem: Coping: Goal: Level of anxiety will decrease Outcome: Adequate for Discharge   Problem: Elimination: Goal: Will not experience complications related to bowel motility Outcome: Adequate for Discharge Goal: Will not experience complications related to urinary retention Outcome: Adequate for Discharge   Problem: Pain Managment: Goal: General experience of comfort will improve and/or be controlled Outcome: Adequate for Discharge   Problem: Safety: Goal: Ability to remain free from injury will improve Outcome: Adequate for Discharge   Problem: Skin Integrity: Goal: Risk for impaired skin integrity will decrease Outcome: Adequate for Discharge   Problem: Education: Goal: Understanding of CV disease, CV risk reduction, and recovery process will improve Outcome: Adequate for Discharge Goal: Individualized Educational Video(s) Outcome: Adequate for Discharge   Problem: Activity: Goal: Ability to return to baseline activity level will improve Outcome: Adequate for Discharge   Problem: Cardiovascular: Goal: Ability to achieve and maintain adequate cardiovascular perfusion will improve Outcome: Adequate for Discharge Goal: Vascular access site(s) Level 0-1 will be maintained Outcome: Adequate for Discharge   Problem: Health Behavior/Discharge Planning: Goal: Ability to safely manage health-related needs after discharge will improve Outcome: Adequate for Discharge

## 2023-08-20 NOTE — Discharge Summary (Signed)
 Physician Discharge Summary   Patient: Tammy Osborne MRN: [id number] DOB: Oct 16, 1955  Admit date:     08/15/2023  Discharge date: 08/20/23  Discharge Physician: Lebron JINNY Cage   PCP: Vicci Barnie NOVAK, MD   Recommendations at discharge:   Follow-up with PCP in 1 week Follow-up with cardiology as scheduled  Discharge Diagnoses: Active Problems:   Chest pain   NSTEMI (non-ST elevated myocardial infarction) [facility])   Status post coronary artery stent placement    Hospital Course: 68 y.o. female with medical history significant of DMII, HTN,HLD, who presents to ED with substernal chest pain with radiation to neck,jaw and shoulder that started today. Patient notes its worse with exertion and better with rest. She also notes associated n/diaphoresis.  Cardiology consulted.  Patient admitted for further management.   Today, patient denies any new complaints.  Patient was able to ambulate the hallway without any issues or any complaints of chest pain.  Patient advised to be compliant with medication, appointments.   Assessment and Plan:  NSTEMI S/p successful percutaneous coronary intervention in the proximal RCA Troponin trended up from 7---56 EKG with no acute ST changes Echo showed EF of 65 to 70%, no regional wall motion normality, left ventricular diastolic parameters are indeterminate Cardiology consulted, LHC on 7/21 showed proximal RCA with 95% stenosis, s/p PCI Continue aspirin , start Brilinta  Increase Crestor  to 20 mg daily, continue Coreg , losartan  and amlodipine  Outpatient follow-up with cardiology   Hypertension Continue BP meds as above Follow-up with PCP   Type 2 DM Last A1c 6.2 Appears to be diet controlled, not on any meds Follow-up with PCP    Consultants: Cardiology Procedures performed: LHC Disposition: Home Diet recommendation:  Cardiac and Carb modified diet   DISCHARGE MEDICATION: Allergies as of 08/20/2023   No Known Allergies       Medication List     TAKE these medications    amLODipine  10 MG tablet Commonly known as: NORVASC  Tome 1 tableta (10 mg en total) por va oral diariamente. (Take 1 tablet (10 mg total) by mouth daily.)   aspirin  EC 81 MG tablet Take 1 tablet (81 mg total) by mouth daily.   carvedilol  12.5 MG tablet Commonly known as: COREG  Take 1.5 tablets (18.75 mg total) by mouth 2 (two) times daily with a meal. What changed:  how much to take when to take this   gabapentin  100 MG capsule Commonly known as: Neurontin  Take 2 capsules (200 mg total) by mouth 2 (two) times daily. What changed: how much to take   losartan  100 MG tablet Commonly known as: COZAAR  Tome 1 tableta (100 mg en total) por va oral diariamente. (Take 1 tablet (100 mg total) by mouth daily.)   nitroGLYCERIN  0.4 MG SL tablet Commonly known as: NITROSTAT  Place 1 tablet (0.4 mg total) under the tongue every 5 (five) minutes as needed for chest pain.   omeprazole  20 MG capsule Commonly known as: PRILOSEC Take 1 capsule (20 mg total) by mouth 2 (two) times daily before a meal. What changed: when to take this   rosuvastatin  20 MG tablet Commonly known as: CRESTOR  Tome 1 tableta (20 mg en total) por va oral diariamente. (Take 1 tablet (20 mg total) by mouth daily.) Start taking on: August 21, 2023 What changed:  medication strength how much to take   ticagrelor  90 MG Tabs tablet Commonly known as: BRILINTA  Take 1 tablet (90 mg total) by mouth 2 (two) times daily.  Follow-up Information     Vicci Barnie NOVAK, MD. Schedule an appointment as soon as possible for a visit in 1 week(s).   Specialty: Internal Medicine Contact information: [address] [place] [address] [phone]                Discharge Exam: Fredricka Weights   08/15/23 2054  Weight: 61 kg   General: NAD  Cardiovascular: S1, S2 present Respiratory: CTAB Abdomen: Soft, nontender, nondistended, bowel  sounds present Musculoskeletal: No bilateral pedal edema noted Skin: Normal Psychiatry: Normal mood   Condition at discharge: stable  The results of significant diagnostics from this hospitalization (including imaging, microbiology, ancillary and laboratory) are listed below for reference.   Imaging Studies: CARDIAC CATHETERIZATION Result Date: 08/19/2023 Images from the original result were not included. Coronary angiography and intervention 08/19/2023: LM: Normal LAD: Mid 20% disease Lcx: Mid 20% disease RCA: Prox 95% stenosis LVEDP 19 mmHg Successful percutaneous coronary intervention prox RCA        PTCA and stent placement 4.0 X 20 mm Synergy drug-eluting stent        Post dilated with 4.0 X 12 mm [place] balloon up to 20 atm        0% residual stenosis, TIMI flow I-->III Recommend DAPT for at least 1 year If patient has cost issues with Brlinta, okay to switch to Effient or Plavix Manish J Patwardhan, MD    ECHOCARDIOGRAM COMPLETE Result Date: 08/16/2023    ECHOCARDIOGRAM REPORT   Patient Name:   Tammy Osborne Date of Exam: 08/16/2023 Medical Rec #:  [id number]              Height:       58.0 in Accession #:    [id number]             Weight:       134.5 lb Date of Birth:  Oct 29, 1955               BSA:          1.538 m Patient Age:    68 years               BP:           128/59 mmHg Patient Gender: F                      HR:           60 bpm. Exam Location:  Inpatient Procedure: Cardiac Doppler and Color Doppler (Both Spectral and Color Flow            Doppler were utilized during procedure). Indications:    Chest Pain  History:        Patient has prior history of Echocardiogram examinations, most                 recent 11/29/2016.  Sonographer:    Therisa Crouch Referring Phys: [id number] SARA-MAIZ A THOMAS IMPRESSIONS  1. Left ventricular ejection fraction, by estimation, is 65 to 70%. The left ventricle has normal function. The left ventricle has no regional wall motion abnormalities. There is mild  asymmetric left ventricular hypertrophy. Left ventricular diastolic parameters are indeterminate.  2. Right ventricular systolic function is normal. The right ventricular size is normal.  3. Left atrial size was mild to moderately dilated.  4. The mitral valve is normal in structure. Trivial mitral valve regurgitation.  5. The aortic valve is tricuspid. Aortic valve  regurgitation is not visualized.  6. The inferior vena cava is normal in size with greater than 50% respiratory variability, suggesting right atrial pressure of 3 mmHg. FINDINGS  Left Ventricle: Left ventricular ejection fraction, by estimation, is 65 to 70%. The left ventricle has normal function. The left ventricle has no regional wall motion abnormalities. The left ventricular internal cavity size was normal in size. There is  mild asymmetric left ventricular hypertrophy. Left ventricular diastolic parameters are indeterminate. Right Ventricle: The right ventricular size is normal. Right vetricular wall thickness was not assessed. Right ventricular systolic function is normal. Left Atrium: Left atrial size was mild to moderately dilated. Right Atrium: Right atrial size was normal in size. Pericardium: There is no evidence of pericardial effusion. Mitral Valve: The mitral valve is normal in structure. Trivial mitral valve regurgitation. Tricuspid Valve: The tricuspid valve is normal in structure. Tricuspid valve regurgitation is trivial. Aortic Valve: The aortic valve is tricuspid. Aortic valve regurgitation is not visualized. Pulmonic Valve: The pulmonic valve was normal in structure. Pulmonic valve regurgitation is not visualized. Aorta: The aortic root and ascending aorta are structurally normal, with no evidence of dilitation. Venous: The inferior vena cava is normal in size with greater than 50% respiratory variability, suggesting right atrial pressure of 3 mmHg. IAS/Shunts: No atrial level shunt detected by color flow Doppler.  LEFT VENTRICLE  PLAX 2D LVIDd:         3.70 cm     Diastology LVIDs:         2.10 cm     LV e' medial:    6.74 cm/s LV PW:         1.00 cm     LV E/e' medial:  8.3 LV IVS:        1.40 cm     LV e' lateral:   8.81 cm/s LVOT diam:     1.90 cm     LV E/e' lateral: 6.3 LVOT Area:     2.84 cm  LV Volumes (MOD) LV vol d, MOD A2C: 69.0 ml LV vol d, MOD A4C: 68.9 ml LV vol s, MOD A2C: 29.9 ml LV vol s, MOD A4C: 25.2 ml LV SV MOD A2C:     39.1 ml LV SV MOD A4C:     68.9 ml LV SV MOD BP:      42.1 ml RIGHT VENTRICLE            IVC RV Basal diam:  3.10 cm    IVC diam: 1.40 cm RV S prime:     8.92 cm/s TAPSE (M-mode): 2.1 cm LEFT ATRIUM             Index LA diam:        3.90 cm 2.54 cm/m LA Vol (A2C):   43.9 ml 28.54 ml/m LA Vol (A4C):   61.8 ml 40.17 ml/m LA Biplane Vol: 52.4 ml 34.06 ml/m   AORTA Ao Root diam: 2.90 cm Ao Asc diam:  2.60 cm MITRAL VALVE               TRICUSPID VALVE MV Area (PHT): 5.95 cm    TR Peak grad:   15.1 mmHg MV E velocity: 55.70 cm/s  TR Vmax:        194.00 cm/s MV A velocity: 55.30 cm/s MV E/A ratio:  1.01        SHUNTS  Systemic Diam: 1.90 cm Vina Gull MD Electronically signed by Vina Gull MD Signature Date/Time: 08/16/2023/5:41:12 PM    Final    DG Chest Port 1 View Result Date: 08/15/2023 CLINICAL DATA:  Chest pain EXAM: PORTABLE CHEST 1 VIEW COMPARISON:  09/28/2019 FINDINGS: The heart size and mediastinal contours are within normal limits. Both lungs are clear. The visualized skeletal structures are unremarkable. IMPRESSION: No active disease. Electronically Signed   By: Oneil Devonshire M.D.   On: 08/15/2023 19:29    Microbiology: Results for orders placed or performed in visit on 02/21/23  Fecal occult blood, imunochemical([facility])     Status: None   Collection Time: 02/27/23 11:41 AM   Specimen: Stool   ST  Result Value Ref Range Status   Fecal Occult Bld Negative Negative Final    Labs: CBC: Recent Labs  Lab 08/15/23 1921 08/16/23 0336 08/19/23 0458  08/20/23 0550  WBC 9.2 14.1* 11.0* 10.5  HGB 14.6 13.6 12.6 12.7  HCT 46.1* 41.6 40.1 39.1  MCV 83.1 81.4 83.9 82.1  PLT 362 338 311 290   Basic Metabolic Panel: Recent Labs  Lab 08/15/23 1921 08/16/23 0336 08/20/23 0550  NA 135  --  137  K 3.9  --  3.7  CL 104  --  104  CO2 19*  --  23  GLUCOSE 203*  --  110*  BUN 23  --  14  CREATININE 0.83 0.58 0.83  CALCIUM  9.5  --  9.0   Liver Function Tests: No results for input(s): AST, ALT, ALKPHOS, BILITOT, PROT, ALBUMIN in the last 168 hours. CBG: Recent Labs  Lab 08/19/23 1418  GLUCAP 112*    Discharge time spent: greater than 30 minutes.  Signed: Lebron JINNY Cage, MD [facility] 08/20/2023

## 2023-08-20 NOTE — Plan of Care (Signed)
   Problem: Education: Goal: Knowledge of General Education information will improve Description: Including pain rating scale, medication(s)/side effects and non-pharmacologic comfort measures Outcome: Progressing   Problem: Health Behavior/Discharge Planning: Goal: Ability to manage health-related needs will improve Outcome: Progressing   Problem: Activity: Goal: Risk for activity intolerance will decrease Outcome: Progressing

## 2023-08-20 NOTE — TOC CM/SW Note (Addendum)
 Pt has been DC. Met with pt. She plans to return home with the support of her family. Her daughter-in-law will provide transportation at time of DC. Pt reports that she doesn't have medical insurance. She reports that she can pay for the new prescriptions. Discussed f/u appt at the [facility] and [facility].

## 2023-08-20 NOTE — Progress Notes (Signed)
  Progress Note  Patient Name: Tammy Osborne Date of Encounter: 08/20/2023 [facility] Cardiologist: None   Interval Summary   No chest pain after cath Reviewed cath with spanish interpreter Edmundo present through IPAD.   Vital Signs Vitals:   08/20/23 0500 08/20/23 0755 08/20/23 0804 08/20/23 0805  BP: 131/60 (!) 139/49  (!) 130/51  Pulse:  60  62  Resp: 18 16  18   Temp: 98 F (36.7 C) 98.2 F (36.8 C)    TempSrc: Oral Oral    SpO2:  98% 96% 98%  Weight:      Height:        Intake/Output Summary (Last 24 hours) at 08/20/2023 1049 Last data filed at 08/19/2023 1702 Gross per 24 hour  Intake 259.05 ml  Output 650 ml  Net -390.95 ml      08/15/2023    8:54 PM 07/18/2023   10:11 AM 06/27/2023    3:15 PM  Last 3 Weights  Weight (lbs) 134 lb 7.7 oz 133 lb 6.4 oz 137 lb  Weight (kg) 61 kg 60.51 kg 62.143 kg      Telemetry/ECG  NSR - Personally Reviewed  Physical Exam  GEN: No acute distress.   Neck: No JVD Cardiac: RRR, no murmurs, rubs, or gallops.  Respiratory: Clear to auscultation bilaterally. GI: Soft, nontender, non-distended  MS: No edema  Assessment & Plan  #angina pectoris with minimal elevated troponin, NSTEMI #HTN - Coronary angiography and intervention 08/19/2023: LM: Normal LAD: Mid 20% disease Lcx: Mid 20% disease RCA: Prox 95% stenosis  LVEDP 19 mmHg  Successful percutaneous coronary intervention prox RCA        PTCA and stent placement 4.0 X 20 mm Synergy drug-eluting stent        Post dilated with 4.0 X 12 mm [place] balloon up to 20 atm        0% residual stenosis, TIMI flow I-->III - asa 81 mg daily - increase crestor  to 20 mg daily for secondary prevention - also on carvedilol  18.75 mg BID, losartan  100 mg daily, amlodipine  10 mg daily. Continue.  - brilinta  and ASA 1 year, I have instructed the patient that dual antiplatelet therapy should be taken for 1 year without interruption.  We have discussed the consequences of  interrupted dual antiplatelet therapy and the risk for in-stent thrombosis.     For questions or updates, please contact [facility] Please consult [email or web address] for contact info under       Signed, Janaiyah Blackard A Morrell Fluke, MD

## 2023-08-20 NOTE — Progress Notes (Signed)
 CARDIAC REHAB PHASE I   PRE:  Rate/Rhythm: 63 SR  BP:  Supine:   Sitting: 108/59  Standing:    SaO2: 98% RA  MODE:  Ambulation: 470 ft   POST:  Rate/Rhythm: 73 SR  BP:  Supine:   Sitting: 101/56  Standing:    SaO2:98% RA  [phone] MI/stent education completed with patient with video interpreter services Taffy [phone]) including restrictions, Brilinta  use, CP, NTG use, and calling 911, risk factor modification, and activity progression. MI book, heart healthy nutrition, and exercise handouts in Spanish given. Patient verbalizes understanding. Discussed Phase 2 cardiac rehab, and she is interested in the program at [facility]. Referral sent. Ambulation completed without symptoms, gait steady, vital signs within normal limits. To bedside for breakfast after walk with call bell in reach.  Arnoldo CHRISTELLA Gal, MS, ACSM CEP 08/20/2023 [phone]

## 2023-08-21 LAB — LIPOPROTEIN A (LPA): Lipoprotein (a): 27.8 nmol/L (ref <75.0)

## 2023-08-23 ENCOUNTER — Telehealth: Payer: Self-pay

## 2023-08-23 ENCOUNTER — Other Ambulatory Visit: Payer: Self-pay

## 2023-08-23 NOTE — Telephone Encounter (Signed)
 Copied from CRM [id number]. Topic: Appointments - Scheduling Inquiry for Clinic >> Aug 23, 2023  3:56 PM Sasha H wrote: Reason for CRM: Pt needs a follow up for a cardiac cath done on July 21st . No openings showing on my side until September

## 2023-08-26 ENCOUNTER — Telehealth (HOSPITAL_COMMUNITY): Payer: Self-pay

## 2023-08-26 ENCOUNTER — Other Ambulatory Visit: Payer: Self-pay

## 2023-08-26 NOTE — Telephone Encounter (Signed)
 Pt is not insured at the moment to participate in cardiac rehab. Will call back if anything changes.   Closed referral.

## 2023-08-27 ENCOUNTER — Ambulatory Visit: Payer: Self-pay | Attending: Internal Medicine | Admitting: Internal Medicine

## 2023-08-27 ENCOUNTER — Other Ambulatory Visit: Payer: Self-pay

## 2023-08-27 ENCOUNTER — Encounter: Payer: Self-pay | Admitting: Internal Medicine

## 2023-08-27 VITALS — BP 110/62 | HR 67 | Temp 97.6°F | Ht <= 58 in | Wt 137.0 lb

## 2023-08-27 DIAGNOSIS — I152 Hypertension secondary to endocrine disorders: Secondary | ICD-10-CM

## 2023-08-27 DIAGNOSIS — I214 Non-ST elevation (NSTEMI) myocardial infarction: Secondary | ICD-10-CM

## 2023-08-27 DIAGNOSIS — E1159 Type 2 diabetes mellitus with other circulatory complications: Secondary | ICD-10-CM

## 2023-08-27 DIAGNOSIS — Z09 Encounter for follow-up examination after completed treatment for conditions other than malignant neoplasm: Secondary | ICD-10-CM

## 2023-08-27 MED ORDER — ROSUVASTATIN CALCIUM 20 MG PO TABS
20.0000 mg | ORAL_TABLET | Freq: Every day | ORAL | 1 refills | Status: DC
  Filled 2023-08-27: qty 90, 90d supply, fill #0

## 2023-08-27 MED ORDER — TICAGRELOR 90 MG PO TABS
90.0000 mg | ORAL_TABLET | Freq: Two times a day (BID) | ORAL | 1 refills | Status: DC
  Filled 2023-08-27: qty 180, 90d supply, fill #0

## 2023-08-27 NOTE — Progress Notes (Signed)
 Patient ID: Corrinne Benegas, female    DOB: 1955/10/16  MRN: [id number]  CC: Hospitalization Follow-up (Hospitalization f/u. Med refills. /Discuss cardiac catheter & if there are any possible restrictions/Discuss if there are dietary restrictions/)   Subjective: Boris Arlee de Raymon is a 68 y.o. female who presents for chronic ds management. Her concerns today include:  Patient with history of HTN,  DM 2 with neuropathy, ILD, HL, anemia, GERD, aortic atherosclerosis   AM[employer] Language interpreter used during this encounter. #[id number]   Discussed the use of AI scribe software for clinical note transcription with the patient, who gave verbal consent to proceed.   History of Present Illness Jaleen Grupp de Raymon is a 69 year old female who presents for follow-up after hospitalization for non-ST elevation myocardial infarction.  She was hospitalized from July 17th to July 22nd due to substernal chest pain and was diagnosed with a non-ST elevation myocardial infarction. During her hospitalization, she underwent cardiac catheterization which revealed a 90% stenosis in the proximal right coronary artery (RCA) and subsequently had a stent placed.  Since her discharge, she has experienced no recurrence of chest pain. Her medication regimen includes aspirin , Brilinta  (Ticagrelor ), and an increased dose of Crestor  (rosuvastatin ) to 20 mg daily. She continues to take carvedilol , Cozaar  (losartan ), and amlodipine  as previously prescribed for HTN. She confirms adherence to her medication regimen, including taking aspirin  daily and Brilinta  twice a day.  She is aware of her upcoming appointment with cardiology on the fifth of next month.  She reports bruising on her stomach and hands, which she attributes to anticoagulant injections received during her hospital stay and IV placements.  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 She has two days off currently, allowing her to rest.    Patient Active Problem List   Diagnosis Date Noted   NSTEMI (non-ST elevated myocardial infarction) (HCC) 08/20/2023   Status post coronary artery stent placement 08/20/2023   Chest pain 08/16/2023   Aortic atherosclerosis (HCC) 09/21/2019   Hyperlipidemia associated with type 2 diabetes mellitus (HCC) 09/21/2019   Gastroesophageal reflux disease without esophagitis 04/04/2018   Controlled type 2 diabetes mellitus with diabetic polyneuropathy, without long-term current use of insulin (HCC) 10/19/2016   Essential hypertension 09/06/2016   Interstitial pulmonary disease (HCC) 09/06/2016   Cough in adult 09/06/2016   Anemia 09/06/2016   Hyperlipidemia 09/06/2016   Numbness of lower limb 09/06/2016     Current Outpatient Medications on File Prior to Visit  Medication Sig Dispense Refill   amLODipine  (NORVASC ) 10 MG tablet Take 1 tablet (10 mg total) by mouth daily. 90 tablet 4   aspirin  EC 81 MG tablet Take 1 tablet (81 mg total) by mouth daily. 100 tablet 1   carvedilol  (COREG ) 12.5 MG tablet Take 1.5 tablets (18.75 mg total) by mouth 2 (two) times daily with a meal. (Patient taking differently: Take 12.5 mg by mouth daily.) 270 tablet 4   gabapentin  (NEURONTIN ) 100 MG capsule Take 2 capsules (200 mg total) by mouth 2 (two) times daily. (Patient taking differently: Take 100 mg by mouth 2 (two) times daily.) 360 capsule 0   losartan  (COZAAR ) 100 MG tablet Take 1 tablet (100 mg total) by mouth daily. 90 tablet 1   nitroGLYCERIN  (NITROSTAT ) 0.4 MG SL tablet Place 1 tablet (0.4 mg total) under the tongue every 5 (five) minutes as needed for chest pain.  30 tablet 0   omeprazole  (PRILOSEC) 20 MG capsule Take 1 capsule (20 mg total) by mouth 2 (two) times daily before a meal. (Patient taking differently: Take 20 mg by mouth daily.) 180 capsule 2   No current facility-administered medications on file prior to visit.     No Known Allergies  Social History   Socioeconomic History   Marital status: Single    Spouse name: Not on file   Number of children: 2   Years of education: Not on file   Highest education level: Not on file  Occupational History   Not on file  Tobacco Use   Smoking status: Never    Passive exposure: Yes   Smokeless tobacco: Never   Tobacco comments:    spouse smoked in home X6-7 years.   Vaping Use   Vaping status: Never Used  Substance and Sexual Activity   Alcohol use: No   Drug use: Never   Sexual activity: Not on file  Other Topics Concern   Not on file  Social History Narrative   Not on file   Social Drivers of Health   Financial Resource Strain: Not on file  Food Insecurity: Not on file  Transportation Needs: Not on file  Physical Activity: Not on file  Stress: Not on file  Social Connections: Not on file  Intimate Partner Violence: Not on file    Family History  Problem Relation Age of Onset   Hypertension Mother     Past Surgical History:  Procedure Laterality Date   ABDOMINAL HYSTERECTOMY     CORONARY STENT INTERVENTION N/A 08/19/2023   Procedure: CORONARY STENT INTERVENTION;  Surgeon: Elmira Newman PARAS, MD;  Location: [facility] INVASIVE CV LAB;  Service: Cardiovascular;  Laterality: N/A;   LEFT HEART CATH AND CORONARY ANGIOGRAPHY N/A 08/19/2023   Procedure: LEFT HEART CATH AND CORONARY ANGIOGRAPHY;  Surgeon: Elmira Newman PARAS, MD;  Location: [facility] INVASIVE CV LAB;  Service: Cardiovascular;  Laterality: N/A;    ROS: Review of Systems Negative except as stated above  PHYSICAL EXAM: BP 110/62 (BP Location: Left Arm, Patient Position: Sitting, Cuff Size: Normal)   Pulse 67   Temp 97.6 F (36.4 C) (Oral)   Ht 4' 10 (1.473 m)   Wt 137 lb (62.1 kg)   LMP  (LMP Unknown)   SpO2 96%   BMI 28.63 kg/m   Physical Exam  General appearance - alert, well appearing, older Hispanic female sitting and in no distress Mental status - normal mood,  behavior, speech, dress, motor activity, and thought processes Neck - supple, no significant adenopathy Chest - clear to auscultation, no wheezes, rales or rhonchi, symmetric air entry Heart - normal rate, regular rhythm, normal S1, S2, no murmurs, rubs, clicks or gallops Extremities - peripheral pulses normal, no pedal edema, no clubbing or cyanosis      Latest Ref Rng & Units 08/20/2023    5:50 AM 08/16/2023    3:36 AM 08/15/2023    7:21 PM  CMP  Glucose 70 - 99 mg/dL 889   796   BUN 8 - 23 mg/dL 14   23   Creatinine 9.55 - 1.00 mg/dL 9.16  9.41  9.16   Sodium 135 - 145 mmol/L 137   135   Potassium 3.5 - 5.1 mmol/L 3.7   3.9   Chloride 98 - 111 mmol/L 104   104   CO2 22 - 32 mmol/L 23   19   Calcium  8.9 - 10.3 mg/dL 9.0  9.5    Lipid Panel     Component Value Date/Time   CHOL 176 08/17/2023 0538   CHOL 262 (H) 06/16/2021 1209   TRIG 373 (H) 08/17/2023 0538   HDL 36 (L) 08/17/2023 0538   HDL 38 (L) 06/16/2021 1209   CHOLHDL 4.9 08/17/2023 0538   VLDL 75 (H) 08/17/2023 0538   LDLCALC 65 08/17/2023 0538   LDLCALC 131 (H) 06/16/2021 1209    CBC    Component Value Date/Time   WBC 10.5 08/20/2023 0550   RBC 4.76 08/20/2023 0550   HGB 12.7 08/20/2023 0550   HGB 15.4 11/07/2021 0834   HCT 39.1 08/20/2023 0550   HCT 47.1 (H) 11/07/2021 0834   PLT 290 08/20/2023 0550   PLT 376 11/07/2021 0834   MCV 82.1 08/20/2023 0550   MCV 81 11/07/2021 0834   MCH 26.7 08/20/2023 0550   MCHC 32.5 08/20/2023 0550   RDW 14.8 08/20/2023 0550   RDW 15.2 11/07/2021 0834   LYMPHSABS 5.0 (H) 11/07/2022 1149   LYMPHSABS 3.2 (H) 11/07/2021 0834   MONOABS 1.0 11/07/2022 1149   EOSABS 0.0 11/07/2022 1149   EOSABS 0.1 11/07/2021 0834   BASOSABS 0.0 11/07/2022 1149   BASOSABS 0.0 11/07/2021 0834    ASSESSMENT AND PLAN: 1. Hospital discharge follow-up (Primary)   2. NSTEMI (non-ST elevated myocardial infarction) [facility]) Patient has undergone cardiac cath with stent to the RCA.  She has been  pain-free since discharge from the hospital.  Stressed with her the importance of taking the Brilinta  and aspirin  every day to prevent in-stent thrombosis.  Continue Crestor  and carvedilol . - rosuvastatin  (CRESTOR ) 20 MG tablet; Take 1 tablet (20 mg total) by mouth daily.  Dispense: 90 tablet; Refill: 1 - ticagrelor  (BRILINTA ) 90 MG TABS tablet; Take 1 tablet (90 mg total) by mouth 2 (two) times daily.  Dispense: 180 tablet; Refill: 1 - CBC - Hepatic Function Panel  3. Hypertension associated with diabetes (HCC) At goal.  Continue amlodipine  10 mg, Cozaar  100 mg daily and carvedilol  12.5 mg 1-1/2 tablets twice a day  Patient was given the opportunity to ask questions.  Patient verbalized understanding of the plan and was able to repeat key elements of the plan.   This documentation was completed using Paediatric nurse.  Any transcriptional errors are unintentional.  Orders Placed This Encounter  Procedures   CBC   Hepatic Function Panel     Requested Prescriptions   Signed Prescriptions Disp Refills   rosuvastatin  (CRESTOR ) 20 MG tablet 90 tablet 1    Sig: Take 1 tablet (20 mg total) by mouth daily.   ticagrelor  (BRILINTA ) 90 MG TABS tablet 180 tablet 1    Sig: Take 1 tablet (90 mg total) by mouth 2 (two) times daily.    No follow-ups on file.  Barnie Louder, MD, FACP

## 2023-08-28 ENCOUNTER — Ambulatory Visit: Payer: Self-pay | Admitting: Internal Medicine

## 2023-08-28 LAB — CBC
Hematocrit: 40.2 % (ref 34.0–46.6)
Hemoglobin: 12.6 g/dL (ref 11.1–15.9)
MCH: 26.6 pg (ref 26.6–33.0)
MCHC: 31.3 g/dL — ABNORMAL LOW (ref 31.5–35.7)
MCV: 85 fL (ref 79–97)
Platelets: 386 x10E3/uL (ref 150–450)
RBC: 4.73 x10E6/uL (ref 3.77–5.28)
RDW: 14.8 % (ref 11.7–15.4)
WBC: 8 x10E3/uL (ref 3.4–10.8)

## 2023-08-28 LAB — HEPATIC FUNCTION PANEL
ALT: 26 IU/L (ref 0–32)
AST: 16 IU/L (ref 0–40)
Albumin: 4.1 g/dL (ref 3.9–4.9)
Alkaline Phosphatase: 138 IU/L — ABNORMAL HIGH (ref 44–121)
Bilirubin Total: 0.2 mg/dL (ref 0.0–1.2)
Bilirubin, Direct: 0.09 mg/dL (ref 0.00–0.40)
Total Protein: 6.5 g/dL (ref 6.0–8.5)

## 2023-08-30 NOTE — Progress Notes (Signed)
 Cardiology Office Note:  .   Date:  09/03/2023  ID:  Tammy Osborne, DOB 10/04/1955, MRN [id number] PCP: Vicci Barnie NOVAK, MD  [facility] Providers Cardiologist:  None    History of Present Illness: .   Tammy Osborne is a 68 y.o. female  with a hx of coronary Ca+ seen on CT but nl MPI study, DM, HTN, HLD w/ hypertriglyceridemia,  Patient admitted 07/2023 with NSTEMI and underwent DES pRCA.  Patient comes in with a friend who interprets for her. Interpreter didn't show. She denies chest pain or SOB. When she first came home she felt like her heart beat a little fast but not since then. Walking 10-15 min a day. She can't do cardiac rehab as she doesn't drive. 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    ROS:    Studies Reviewed: SABRA         Prior CV Studies:   Coronary angiography and intervention 08/19/2023: LM: Normal LAD: Mid 20% disease Lcx: Mid 20% disease RCA: Prox 95% stenosis  LVEDP 19 mmHg  Successful percutaneous coronary intervention prox RCA        PTCA and stent placement 4.0 X 20 mm Synergy drug-eluting stent        Post dilated with 4.0 X 12 mm Levering balloon up to 20 atm        0% residual stenosis, TIMI flow I-->III ECHO: 08/16/2023  1. Left ventricular ejection fraction, by estimation, is 65 to 70%. The  left ventricle has normal function. The left ventricle has no regional  wall motion abnormalities. There is mild asymmetric left ventricular  hypertrophy. Left ventricular diastolic parameters are indeterminate.   2. Right ventricular systolic function is normal. The right ventricular  size is normal.   3. Left atrial size was mild to moderately dilated.   4. The mitral valve is normal in structure. Trivial mitral valve  regurgitation.   5. The aortic valve is tricuspid. Aortic valve regurgitation is not  visualized.   6. The inferior vena cava is normal in size with greater than 50%  respiratory variability, suggesting right atrial  pressure of 3 mmHg.   NM Stress 02/24/2019 Nuclear stress EF: 73%. The left ventricular ejection fraction is hyperdynamic (>65%). There was no ST segment deviation noted during stress. This is a low risk study. The study is normal. There is no evidence of ischemia or previous infarction.    Risk Assessment/Calculations:             Physical Exam:   VS:  BP 108/64   Pulse 69   Ht 4' 10 (1.473 m)   Wt 134 lb 6.4 oz (61 kg)   LMP  (LMP Unknown)   SpO2 97%   BMI 28.09 kg/m    Orhtostatics: No data found. Wt Readings from Last 3 Encounters:  09/03/23 134 lb 6.4 oz (61 kg)  08/27/23 137 lb (62.1 kg)  08/15/23 134 lb 7.7 oz (61 kg)    GEN: Well nourished, well developed in no acute distress NECK: No JVD; No carotid bruits CARDIAC:  RRR, no murmurs, rubs, gallops RESPIRATORY:  Clear to auscultation without rales, wheezing or rhonchi  ABDOMEN: Soft, non-tender, non-distended EXTREMITIES:  cath site stable right wrist, No edema; No deformity   ASSESSMENT AND PLAN: .    CAD s/p  NSTEMI and underwent DES pRCA.-doing well without angina -continue ASA, Brilinta , crestor  refill meds  HTN-controlled with losartan , coreg , amlodipine   HLD  LDL 65, trig 373, on lipitor, reduce sugars in diet  DM2 A1C 6.2        Dispo: f/u in 3-4 months  Signed, Olivia Pavy, PA-C

## 2023-09-03 ENCOUNTER — Ambulatory Visit: Payer: Self-pay | Attending: Physician Assistant | Admitting: Physician Assistant

## 2023-09-03 ENCOUNTER — Encounter: Payer: Self-pay | Admitting: Physician Assistant

## 2023-09-03 VITALS — BP 108/64 | HR 69 | Ht <= 58 in | Wt 134.4 lb

## 2023-09-03 DIAGNOSIS — I10 Essential (primary) hypertension: Secondary | ICD-10-CM

## 2023-09-03 DIAGNOSIS — I214 Non-ST elevation (NSTEMI) myocardial infarction: Secondary | ICD-10-CM

## 2023-09-03 DIAGNOSIS — E1159 Type 2 diabetes mellitus with other circulatory complications: Secondary | ICD-10-CM

## 2023-09-03 DIAGNOSIS — I152 Hypertension secondary to endocrine disorders: Secondary | ICD-10-CM

## 2023-09-03 DIAGNOSIS — K219 Gastro-esophageal reflux disease without esophagitis: Secondary | ICD-10-CM

## 2023-09-03 DIAGNOSIS — I700 Atherosclerosis of aorta: Secondary | ICD-10-CM

## 2023-09-03 DIAGNOSIS — E785 Hyperlipidemia, unspecified: Secondary | ICD-10-CM

## 2023-09-03 DIAGNOSIS — E1142 Type 2 diabetes mellitus with diabetic polyneuropathy: Secondary | ICD-10-CM

## 2023-09-03 MED ORDER — ROSUVASTATIN CALCIUM 20 MG PO TABS: 20.0000 mg | ORAL_TABLET | Freq: Every day | ORAL | 1 refills | Status: DC

## 2023-09-03 MED ORDER — AMLODIPINE BESYLATE 10 MG PO TABS: 10.0000 mg | ORAL_TABLET | Freq: Every day | ORAL | 4 refills | Status: DC

## 2023-09-03 MED ORDER — TICAGRELOR 90 MG PO TABS: 90.0000 mg | ORAL_TABLET | Freq: Two times a day (BID) | ORAL | 1 refills | Status: DC

## 2023-09-03 NOTE — Addendum Note (Signed)
 Addended by: BYRON LEONTINE RAMAN on: 09/03/2023 01:43 PM   Modules accepted: Orders

## 2023-09-03 NOTE — Patient Instructions (Signed)
 Medication Instructions:  No changes *If you need a refill on your cardiac medications before your next appointment, please call your pharmacy*  Lab Work: No labs If you have labs (blood work) drawn today and your tests are completely normal, you will receive your results only by: MyChart Message (if you have MyChart) OR A paper copy in the mail If you have any lab test that is abnormal or we need to change your treatment, we will call you to review the results.  Testing/Procedures: No testing  Follow-Up: At [facility], you and your health needs are our priority.  As part of our continuing mission to provide you with exceptional heart care, our providers are all part of one team.  This team includes your primary Cardiologist (physician) and Advanced Practice Providers or APPs (Physician Assistants and Nurse Practitioners) who all work together to provide you with the care you need, when you need it.  Your next appointment:   3 month(s)  Provider:   Darryle ONEIDA Decent, MD    We recommend signing up for the patient portal called MyChart.  Sign up information is provided on this After Visit Summary.  MyChart is used to connect with patients for Virtual Visits (Telemedicine).  Patients are able to view lab/test results, encounter notes, upcoming appointments, etc.  Non-urgent messages can be sent to your provider as well.   To learn more about what you can do with MyChart, go to [email or web address].

## 2023-11-04 ENCOUNTER — Other Ambulatory Visit: Payer: Self-pay

## 2023-11-04 ENCOUNTER — Ambulatory Visit: Payer: Self-pay | Attending: Internal Medicine | Admitting: Internal Medicine

## 2023-11-04 ENCOUNTER — Encounter: Payer: Self-pay | Admitting: Internal Medicine

## 2023-11-04 ENCOUNTER — Other Ambulatory Visit: Payer: Self-pay | Admitting: Internal Medicine

## 2023-11-04 VITALS — BP 154/72 | HR 69 | Temp 97.8°F | Ht <= 58 in | Wt 133.0 lb

## 2023-11-04 DIAGNOSIS — Z7982 Long term (current) use of aspirin: Secondary | ICD-10-CM

## 2023-11-04 DIAGNOSIS — I152 Hypertension secondary to endocrine disorders: Secondary | ICD-10-CM

## 2023-11-04 DIAGNOSIS — I2510 Atherosclerotic heart disease of native coronary artery without angina pectoris: Secondary | ICD-10-CM

## 2023-11-04 DIAGNOSIS — E1142 Type 2 diabetes mellitus with diabetic polyneuropathy: Secondary | ICD-10-CM

## 2023-11-04 DIAGNOSIS — Z23 Encounter for immunization: Secondary | ICD-10-CM

## 2023-11-04 DIAGNOSIS — Z79899 Other long term (current) drug therapy: Secondary | ICD-10-CM

## 2023-11-04 DIAGNOSIS — Z955 Presence of coronary angioplasty implant and graft: Secondary | ICD-10-CM

## 2023-11-04 DIAGNOSIS — E1159 Type 2 diabetes mellitus with other circulatory complications: Secondary | ICD-10-CM

## 2023-11-04 DIAGNOSIS — J849 Interstitial pulmonary disease, unspecified: Secondary | ICD-10-CM

## 2023-11-04 DIAGNOSIS — Z7901 Long term (current) use of anticoagulants: Secondary | ICD-10-CM

## 2023-11-04 MED ORDER — AMLODIPINE BESYLATE 10 MG PO TABS
10.0000 mg | ORAL_TABLET | Freq: Every day | ORAL | 4 refills | Status: AC
  Filled 2023-11-04: qty 90, 90d supply, fill #0
  Filled 2024-01-28: qty 90, 90d supply, fill #1

## 2023-11-04 MED ORDER — TICAGRELOR 90 MG PO TABS
90.0000 mg | ORAL_TABLET | Freq: Two times a day (BID) | ORAL | 1 refills | Status: DC
  Filled 2023-11-04: qty 60, 30d supply, fill #0
  Filled 2023-11-04: qty 180, 90d supply, fill #0

## 2023-11-04 MED ORDER — GABAPENTIN 100 MG PO CAPS
200.0000 mg | ORAL_CAPSULE | Freq: Two times a day (BID) | ORAL | 0 refills | Status: AC
  Filled 2023-11-04: qty 360, 90d supply, fill #0
  Filled 2024-01-28: qty 120, 30d supply, fill #0

## 2023-11-04 MED ORDER — ROSUVASTATIN CALCIUM 20 MG PO TABS
20.0000 mg | ORAL_TABLET | Freq: Every day | ORAL | 1 refills | Status: AC
  Filled 2023-11-04: qty 90, 90d supply, fill #0

## 2023-11-04 NOTE — Patient Instructions (Addendum)
 You can get your eye exam done at a [facility] center for a lower cost, let them know you have diabetes when you go.   Please check your blood pressure at least twice a week, write down the readings and time you take your blood pressures. Bring these readings to the next visit.  If still high we may have to make an adjustment to blood pressure medications. Bring these readings to the clinical pharmacist visit in one month.   --   Puede hacerse un examen de la vista en un [facility] a un precio ms bajo. Infrmeles que tiene diabetes cuando vaya.  Por favor, revise su presin arterial al [facility] veces por semana. Anote las lecturas y la hora de la toma. Lleve estas lecturas a la prxima consulta. Si la presin arterial sigue alta, es posible que tengamos que ajustar la medicacin para la presin arterial. Lleve estas lecturas a la consulta con el farmacutico clnico dentro de un mes.

## 2023-11-04 NOTE — Progress Notes (Signed)
 Patient ID: Tammy Osborne, female    DOB: 09-27-1955  MRN: [id number]  CC: Hypertension (Htn f/u. /Intermittent episodes of tachycardia - lasts a few seconds /Episodes of pulsatile tinnitus /Reports taking 10 mg of rosuvastatin  rather than 20 mg /Flu vax administered on 11/04/2023 - C.A.)   Subjective: Tammy Osborne is a 68 y.o. female who presents for chronic ds management. Her concerns today include: history of HTN,  DM 2 with neuropathy, ILD, HL, anemia, GERD, CAD s/p NSTEMI and underwent DES pRCA, aortic atherosclerosis   [employer] Language interpreter used during this encounter. #[id number] Tammy Osborne  CAD s/p NSTEMI and underwent DES pRCA- She was hospitalized from July 17th to July 22nd due to substernal chest pain and was diagnosed with a non-ST elevation myocardial infarction. During her hospitalization, she underwent cardiac catheterization which revealed a 90% stenosis in the proximal right coronary artery (RCA) and subsequently had a stent placed. Was started on rosuvastatin  20 mg daily and brilinta  by cardiology.  Reports she sometimes she takes two 5mg  tablets of rosuvastatin  when she runs out of the 20 mg. Was unsure if she should take 4 pills at once. Continues to take Brilinta . Reports that since her cath on 08/19/2023, she has had palpitations 2-3x a week that last for a few seconds and is worried her heart may burst which makes her anxious. Denies CP, SOB, diaphoresis, or lightheadedness w/ or w/o the palpitations. Has not needed nitro.   DM- Diet controlled. Last A1c 6.2. Eye exam not scheduled. Continues healthy eating habits and exercise.  HTN- BP 150/70 initial; 154/72 on recheck. Is taking amloidpine, cozaar , carvedilol  every morning with great compliance. Reports her BPs run 132-133/87 at home, checks 2-3x a week with arm device. She is limiting salt. Denies SOB, CP, leg swelling, HA, dizziness. Wants refills sent to downstairs pharmacy.  ILD- Has not felt short  of breath or sick and denies cough. Last saw pulmonology in 06/2023 w/ improved PFTs, she is now being observed off medications and will f/u with them PRN.  HM Wants Influenza Vaccine  Urine ACR due  Patient Active Problem List   Diagnosis Date Noted   NSTEMI (non-ST elevated myocardial infarction) (HCC) 08/20/2023   Status post coronary artery stent placement 08/20/2023   Chest pain 08/16/2023   Aortic atherosclerosis 09/21/2019   Hyperlipidemia associated with type 2 diabetes mellitus (HCC) 09/21/2019   Gastroesophageal reflux disease without esophagitis 04/04/2018   Controlled type 2 diabetes mellitus with diabetic polyneuropathy, without long-term current use of insulin (HCC) 10/19/2016   Essential hypertension 09/06/2016   Interstitial pulmonary disease (HCC) 09/06/2016   Cough in adult 09/06/2016   Anemia 09/06/2016   Hyperlipidemia 09/06/2016   Numbness of lower limb 09/06/2016     Current Outpatient Medications on File Prior to Visit  Medication Sig Dispense Refill   aspirin  EC 81 MG tablet Take 1 tablet (81 mg total) by mouth daily. 100 tablet 1   carvedilol  (COREG ) 12.5 MG tablet Take 1.5 tablets (18.75 mg total) by mouth 2 (two) times daily with a meal. 270 tablet 4   losartan  (COZAAR ) 100 MG tablet Take 1 tablet (100 mg total) by mouth daily. 90 tablet 1   nitroGLYCERIN  (NITROSTAT ) 0.4 MG SL tablet Place 1 tablet (0.4 mg total) under the tongue every 5 (five) minutes as needed for chest pain. 30 tablet 0   omeprazole  (PRILOSEC) 20 MG capsule Take 1 capsule (20 mg total) by mouth 2 (two) times daily before  a meal. (Patient taking differently: Take 20 mg by mouth daily.) 180 capsule 2   No current facility-administered medications on file prior to visit.    No Known Allergies  Social History   Socioeconomic History   Marital status: Single    Spouse name: Not on file   Number of children: 2   Years of education: Not on file   Highest education level: Not on file   Occupational History   Not on file  Tobacco Use   Smoking status: Never    Passive exposure: Yes   Smokeless tobacco: Never   Tobacco comments:    spouse smoked in home X6-7 years.   Vaping Use   Vaping status: Never Used  Substance and Sexual Activity   Alcohol use: No   Drug use: Never   Sexual activity: Not on file  Other Topics Concern   Not on file  Social History Narrative   Not on file   Social Drivers of Health   Financial Resource Strain: Not on file  Food Insecurity: Not on file  Transportation Needs: Not on file  Physical Activity: Not on file  Stress: Not on file  Social Connections: Not on file  Intimate Partner Violence: Not on file    Family History  Problem Relation Age of Onset   Hypertension Mother     Past Surgical History:  Procedure Laterality Date   ABDOMINAL HYSTERECTOMY     CORONARY STENT INTERVENTION N/A 08/19/2023   Procedure: CORONARY STENT INTERVENTION;  Surgeon: Elmira Newman PARAS, MD;  Location: [facility] INVASIVE CV LAB;  Service: Cardiovascular;  Laterality: N/A;   LEFT HEART CATH AND CORONARY ANGIOGRAPHY N/A 08/19/2023   Procedure: LEFT HEART CATH AND CORONARY ANGIOGRAPHY;  Surgeon: Elmira Newman PARAS, MD;  Location: [facility] INVASIVE CV LAB;  Service: Cardiovascular;  Laterality: N/A;    ROS: Review of Systems Negative except as stated above  PHYSICAL EXAM: BP (!) 154/72   Pulse 69   Temp 97.8 F (36.6 C) (Oral)   Ht 4' 10 (1.473 m)   Wt 133 lb (60.3 kg)   LMP  (LMP Unknown)   SpO2 97%   BMI 27.80 kg/m   Physical Exam  General appearance - alert, well appearing, Hispanic female in no distress Mental status - normal mood, behavior, speech, dress, motor activity, and thought processes Chest - clear to auscultation, no wheezes, rales or rhonchi, symmetric air entry Heart - normal rate, regular rhythm, normal S1, S2, no murmurs, rubs, clicks or gallops Extremities - no pedal edema noted      Latest Ref Rng & Units 08/27/2023     2:57 PM 08/20/2023    5:50 AM 08/16/2023    3:36 AM  CMP  Glucose 70 - 99 mg/dL  889    BUN 8 - 23 mg/dL  14    Creatinine 9.55 - 1.00 mg/dL  9.16  9.41   Sodium 864 - 145 mmol/L  137    Potassium 3.5 - 5.1 mmol/L  3.7    Chloride 98 - 111 mmol/L  104    CO2 22 - 32 mmol/L  23    Calcium  8.9 - 10.3 mg/dL  9.0    Total Protein 6.0 - 8.5 g/dL 6.5     Total Bilirubin 0.0 - 1.2 mg/dL 0.2     Alkaline Phos 44 - 121 IU/L 138     AST 0 - 40 IU/L 16     ALT 0 - 32 IU/L 26  Lipid Panel     Component Value Date/Time   CHOL 176 08/17/2023 0538   CHOL 262 (H) 06/16/2021 1209   TRIG 373 (H) 08/17/2023 0538   HDL 36 (L) 08/17/2023 0538   HDL 38 (L) 06/16/2021 1209   CHOLHDL 4.9 08/17/2023 0538   VLDL 75 (H) 08/17/2023 0538   LDLCALC 65 08/17/2023 0538   LDLCALC 131 (H) 06/16/2021 1209    CBC    Component Value Date/Time   WBC 8.0 08/27/2023 1457   WBC 10.5 08/20/2023 0550   RBC 4.73 08/27/2023 1457   RBC 4.76 08/20/2023 0550   HGB 12.6 08/27/2023 1457   HCT 40.2 08/27/2023 1457   PLT 386 08/27/2023 1457   MCV 85 08/27/2023 1457   MCH 26.6 08/27/2023 1457   MCH 26.7 08/20/2023 0550   MCHC 31.3 (L) 08/27/2023 1457   MCHC 32.5 08/20/2023 0550   RDW 14.8 08/27/2023 1457   LYMPHSABS 5.0 (H) 11/07/2022 1149   LYMPHSABS 3.2 (H) 11/07/2021 0834   MONOABS 1.0 11/07/2022 1149   EOSABS 0.0 11/07/2022 1149   EOSABS 0.1 11/07/2021 0834   BASOSABS 0.0 11/07/2022 1149   BASOSABS 0.0 11/07/2021 0834   Lab Results  Component Value Date   HGBA1C 6.2 (H) 08/16/2023   HGBA1C 6.3 06/27/2023   HGBA1C 6.4 02/21/2023     ASSESSMENT AND PLAN:  Assessment and Plan  1. Type 2 diabetes mellitus with peripheral neuropathy (HCC) Diet controlled. Continue healthy eating and exercise. - Advised her she can get her diabetic eye exam at [facility] at a lower cost since she is uninsured. - gabapentin  (NEURONTIN ) 100 MG capsule; Take 2 capsules (200 mg total) by mouth 2 (two) times daily.   Dispense: 360 capsule; Refill: 0 - Microalbumin / creatinine, urine ratio  2. Hypertension associated with diabetes (HCC) (Primary) Not at goal.  Multiple previous office BPs were in good range. Will have her check BPs at least twice a week at home and bring the BP logs to a f/u visit in one month with the clinical pharmacist. If still elevated, we will consider adding spironolactone 12.5mg  tablet daily with close monitoring of her potassium levels. - Continue carvedilol  18.75 mg twice a day, amlodipine  10 mg daily, and Cozaar  100 mg daily  - f/u visit in one month with clinical pharmacist for BP.  3. Coronary artery disease involving native coronary artery of native heart without angina pectoris Stable. Strongly advised her to take rosuvastatin  20 mg daily instead of 10 mg, and if she needs to use the 5mg  pills she should take four of them. Informed her this will reduce her risk of MI. F/u with cardiology as needed.  - Continue Brilinta  per cardiology  - ticagrelor  (BRILINTA ) 90 MG TABS tablet; Take 1 tablet (90 mg total) by mouth 2 (two) times daily.  Dispense: 180 tablet; Refill: 1 - rosuvastatin  (CRESTOR ) 20 MG tablet; Take 1 tablet (20 mg total) by mouth daily.  Dispense: 90 tablet; Refill: 1  4. Status post coronary artery stent placement #3 as above.  5. Interstitial pulmonary disease (HCC) Stable. Follow up with pulmonology as needed.  6. Need for immunization against influenza - Flu vaccine trivalent PF, 6mos and older(Flulaval,Afluria,Fluarix,Fluzone)   Patient was given the opportunity to ask questions.  Patient verbalized understanding of the plan and was able to repeat key elements of the plan.     Orders Placed This Encounter  Procedures   Flu vaccine HIGH DOSE PF(Fluzone Trivalent)   Microalbumin / creatinine,  urine ratio     Requested Prescriptions   Signed Prescriptions Disp Refills   ticagrelor  (BRILINTA ) 90 MG TABS tablet 180 tablet 1    Sig: Take 1 tablet  (90 mg total) by mouth 2 (two) times daily.   rosuvastatin  (CRESTOR ) 20 MG tablet 90 tablet 1    Sig: Take 1 tablet (20 mg total) by mouth daily.   gabapentin  (NEURONTIN ) 100 MG capsule 360 capsule 0    Sig: Take 2 capsules (200 mg total) by mouth 2 (two) times daily.   amLODipine  (NORVASC ) 10 MG tablet 90 tablet 4    Sig: Take 1 tablet (10 mg total) by mouth daily.    Return in about 4 months (around 03/06/2024) for f/u in one month w/ clinical pharmacist appt for BP.  This is a Psychologist, occupational Note.  The care of the patient was discussed with Dr. Vicci and the assessment and plan formulated with her assistance.  Please see her attestation of this encounter.  Duwaine Amber, MS3 [employer]   Evaluation and management procedures were performed by me with Medical Student in attendance, note written by Medical Student under my supervision and collaboration. I have reviewed the note and I agree with the management and plan.   Barnie Vicci, MD, FAAFP. [facility] [place], [place] [phone]   11/04/2023, 5:28 PM

## 2023-11-05 ENCOUNTER — Other Ambulatory Visit: Payer: Self-pay

## 2023-11-07 ENCOUNTER — Ambulatory Visit: Payer: Self-pay | Admitting: Internal Medicine

## 2023-11-07 ENCOUNTER — Other Ambulatory Visit: Payer: Self-pay

## 2023-11-07 DIAGNOSIS — E1142 Type 2 diabetes mellitus with diabetic polyneuropathy: Secondary | ICD-10-CM

## 2023-11-07 LAB — MICROALBUMIN / CREATININE URINE RATIO
Creatinine, Urine: 158.6 mg/dL
Microalb/Creat Ratio: 305 mg/g{creat} — ABNORMAL HIGH (ref 0–29)
Microalbumin, Urine: 483.9 ug/mL

## 2023-11-07 MED ORDER — DAPAGLIFLOZIN PROPANEDIOL 5 MG PO TABS
5.0000 mg | ORAL_TABLET | Freq: Every day | ORAL | 3 refills | Status: DC
  Filled 2023-11-07 (×2): qty 30, 30d supply, fill #0

## 2023-11-07 NOTE — Progress Notes (Signed)
 Still has protein in the urine but less compared to a year ago.  Continue losartan .  Would like to add low-dose of the medication called Farxiga that will help in protecting the kidneys and diabetes.  Prescription sent to our pharmacy.  After being on the medication for 2wk, please return to the lab to have kidney function rechecked.  The medication will cause her to urinate more so she should stay hydrated by drinking 4 to 8 glasses of water daily.

## 2023-11-20 ENCOUNTER — Other Ambulatory Visit: Payer: Self-pay

## 2023-12-03 NOTE — Progress Notes (Unsigned)
 Cardiology Office Note:  .   Date:  12/05/2023  ID:  Tammy Osborne, DOB October 31, 1955, MRN [id number] PCP: Vicci Barnie NOVAK, MD  [facility] Providers Cardiologist:  Darryle ONEIDA Decent, MD  History of Present Illness: .    Chief Complaint  Patient presents with   Follow-up         Tammy Osborne is a 68 y.o. female with history of CAD who presents for follow-up.    History of Present Illness   Tammy Osborne is a 68 year old female with a history of non-STEMI who presents for follow-up after PCI to the proximal RCA.  She has experienced no chest pain or dyspnea since her hospital discharge following the myocardial infarction in July 2025.  She is taking ticagrelor  (Brilinta ) 90 mg twice daily and has discontinued aspirin  for unclear reasons.   She inquired about the catheterization procedure and whether anything was left inside, and was told that a stent was placed.  She last ate around 11:30 AM to 12:00 PM today.          Problem List 1. Diabetes -A1c 6.2 2. Hypertension  3. Hyperlipidemia  -T chol 176, HDL 36, LDL 65, TG 373 4. Interstitial lung disease  5. CAD -NSTEMI 07/2023 -PCI to pRCA     ROS: All other ROS reviewed and negative. Pertinent positives noted in the HPI.     Studies Reviewed: SABRA       TTE 08/16/2023  1. Left ventricular ejection fraction, by estimation, is 65 to 70%. The  left ventricle has normal function. The left ventricle has no regional  wall motion abnormalities. There is mild asymmetric left ventricular  hypertrophy. Left ventricular diastolic  parameters are indeterminate.   2. Right ventricular systolic function is normal. The right ventricular  size is normal.   3. Left atrial size was mild to moderately dilated.   4. The mitral valve is normal in structure. Trivial mitral valve  regurgitation.   5. The aortic valve is tricuspid. Aortic valve regurgitation is not  visualized.   6. The inferior vena  cava is normal in size with greater than 50%  respiratory variability, suggesting right atrial pressure of 3 mmHg.   LHC 08/19/2023 Coronary angiography and intervention 08/19/2023: LM: Normal LAD: Mid 20% disease Lcx: Mid 20% disease RCA: Prox 95% stenosis Physical Exam:   VS:  BP 130/60 (BP Location: Right Arm, Patient Position: Sitting, Cuff Size: Normal)   Pulse 64   Ht 4' 10 (1.473 m)   Wt 134 lb 9.6 oz (61.1 kg)   LMP  (LMP Unknown)   SpO2 96%   BMI 28.13 kg/m    Wt Readings from Last 3 Encounters:  12/05/23 134 lb 9.6 oz (61.1 kg)  11/04/23 133 lb (60.3 kg)  09/03/23 134 lb 6.4 oz (61 kg)    GEN: Well nourished, well developed in no acute distress NECK: No JVD; No carotid bruits CARDIAC: RRR, no murmurs, rubs, gallops RESPIRATORY:  Clear to auscultation without rales, wheezing or rhonchi  ABDOMEN: Soft, non-tender, non-distended EXTREMITIES:  No edema; No deformity  ASSESSMENT AND PLAN: .   Assessment and Plan    Atherosclerotic heart disease of native coronary artery status post PCI to proximal RCA, on dual antiplatelet therapy Status post PCI to proximal RCA in July 2025. Needs to continue both aspirin  and Brilinta  for one year post-PCI. - Refilled aspirin  and Brilinta  prescriptions. - Instructed to continue dual antiplatelet therapy  for one year post-PCI.  Mixed hyperlipidemia Currently on Crestor  20 mg daily. Requires re-evaluation of lipid levels. - Will schedule fasting lipid panel in the next few weeks.  Essential hypertension Blood pressure is well controlled on current regimen. - Continue current antihypertensive regimen.              Follow-up: Return in about 6 months (around 06/03/2024).   Signed, Darryle DASEN. Barbaraann, MD, [facility]  [facility]  [address] [place], [place] [address] [phone]  4:17 PM

## 2023-12-05 ENCOUNTER — Encounter: Payer: Self-pay | Admitting: Cardiovascular Disease

## 2023-12-05 ENCOUNTER — Ambulatory Visit: Payer: Self-pay | Attending: Cardiovascular Disease | Admitting: Cardiovascular Disease

## 2023-12-05 ENCOUNTER — Other Ambulatory Visit: Payer: Self-pay

## 2023-12-05 ENCOUNTER — Ambulatory Visit: Payer: Self-pay | Admitting: Pharmacist

## 2023-12-05 VITALS — BP 130/60 | HR 64 | Ht <= 58 in | Wt 134.6 lb

## 2023-12-05 DIAGNOSIS — R079 Chest pain, unspecified: Secondary | ICD-10-CM

## 2023-12-05 DIAGNOSIS — I10 Essential (primary) hypertension: Secondary | ICD-10-CM

## 2023-12-05 DIAGNOSIS — I2510 Atherosclerotic heart disease of native coronary artery without angina pectoris: Secondary | ICD-10-CM

## 2023-12-05 DIAGNOSIS — E782 Mixed hyperlipidemia: Secondary | ICD-10-CM

## 2023-12-05 MED ORDER — NITROGLYCERIN 0.4 MG SL SUBL
0.4000 mg | SUBLINGUAL_TABLET | SUBLINGUAL | 4 refills | Status: AC | PRN
  Filled 2023-12-05: qty 25, 9d supply, fill #0

## 2023-12-05 MED ORDER — ASPIRIN EC 81 MG PO TBEC
81.0000 mg | DELAYED_RELEASE_TABLET | Freq: Every day | ORAL | 1 refills | Status: AC
  Filled 2023-12-05: qty 100, 100d supply, fill #0

## 2023-12-05 MED ORDER — TICAGRELOR 90 MG PO TABS
90.0000 mg | ORAL_TABLET | Freq: Two times a day (BID) | ORAL | 2 refills | Status: AC
Start: 2023-12-05 — End: 2024-08-18
  Filled 2023-12-05: qty 60, 30d supply, fill #0
  Filled 2024-01-28 – 2024-02-18 (×3): qty 60, 30d supply, fill #1

## 2023-12-05 NOTE — Patient Instructions (Addendum)
 Medication Instructions:  Your physician has recommended you make the following change in your medication:  RESTART: Aspirin  Remain on Brilinta  until 08/18/2024.  *If you need a refill on your cardiac medications before your next appointment, please call your pharmacy*  Lab Work: Lipids in the next few days If you have labs (blood work) drawn today and your tests are completely normal, you will receive your results only by: MyChart Message (if you have MyChart) OR A paper copy in the mail If you have any lab test that is abnormal or we need to change your treatment, we will call you to review the results.  Follow-Up: At [facility], you and your health needs are our priority.  As part of our continuing mission to provide you with exceptional heart care, our providers are all part of one team.  This team includes your primary Cardiologist (physician) and Advanced Practice Providers or APPs (Physician Assistants and Nurse Practitioners) who all work together to provide you with the care you need, when you need it.  Your next appointment:   6 month(s)  Provider:   One of our Advanced Practice Providers (APPs): Morse Clause, PA-C  Lamarr Satterfield, NP Miriam Shams, NP  Olivia Pavy, PA-C Josefa Beauvais, NP  Leontine Salen, PA-C Orren Fabry, PA-C  Blain, PA-C Ernest Dick, NP  Damien Braver, NP Jon Hails, PA-C  Waddell Donath, PA-C    Dayna Dunn, PA-C  Scott Weaver, PA-C Lum Louis, NP Katlyn West, NP Callie Goodrich, PA-C  Xika Zhao, NP Sheng Haley, PA-C    Kathleen Johnson, PA-C   Then, Darryle ONEIDA Decent, MD will plan to see you again in 1 year(s).

## 2023-12-09 ENCOUNTER — Ambulatory Visit: Payer: Self-pay | Admitting: Pharmacist

## 2023-12-20 ENCOUNTER — Ambulatory Visit: Payer: Self-pay | Admitting: Pharmacist

## 2023-12-24 ENCOUNTER — Other Ambulatory Visit: Payer: Self-pay

## 2023-12-24 DIAGNOSIS — I2510 Atherosclerotic heart disease of native coronary artery without angina pectoris: Secondary | ICD-10-CM

## 2023-12-24 DIAGNOSIS — E1142 Type 2 diabetes mellitus with diabetic polyneuropathy: Secondary | ICD-10-CM

## 2023-12-24 DIAGNOSIS — E782 Mixed hyperlipidemia: Secondary | ICD-10-CM

## 2023-12-24 LAB — BASIC METABOLIC PANEL WITH GFR
BUN/Creatinine Ratio: 21 (ref 12–28)
BUN: 15 mg/dL (ref 8–27)
CO2: 20 mmol/L (ref 20–29)
Calcium: 9.3 mg/dL (ref 8.7–10.3)
Chloride: 105 mmol/L (ref 96–106)
Creatinine, Ser: 0.72 mg/dL (ref 0.57–1.00)
Glucose: 99 mg/dL (ref 70–99)
Potassium: 3.9 mmol/L (ref 3.5–5.2)
Sodium: 142 mmol/L (ref 134–144)
eGFR: 92 mL/min/1.73 (ref >59)

## 2023-12-24 LAB — LIPID PANEL
Chol/HDL Ratio: 3.8 ratio (ref 0.0–4.4)
Cholesterol, Total: 133 mg/dL (ref 100–199)
HDL: 35 mg/dL — ABNORMAL LOW (ref >39)
LDL Chol Calc (NIH): 57 mg/dL (ref 0–99)
Triglycerides: 256 mg/dL — ABNORMAL HIGH (ref 0–149)
VLDL Cholesterol Cal: 41 mg/dL — ABNORMAL HIGH (ref 5–40)

## 2023-12-24 LAB — HEMOGLOBIN A1C
Est. average glucose Bld gHb Est-mCnc: 146 mg/dL
Hgb A1c MFr Bld: 6.7 % — ABNORMAL HIGH (ref 4.8–5.6)

## 2023-12-25 ENCOUNTER — Ambulatory Visit: Payer: Self-pay | Admitting: Internal Medicine

## 2023-12-26 ENCOUNTER — Ambulatory Visit: Payer: Self-pay | Admitting: Cardiovascular Disease

## 2023-12-26 DIAGNOSIS — I2510 Atherosclerotic heart disease of native coronary artery without angina pectoris: Secondary | ICD-10-CM

## 2023-12-26 DIAGNOSIS — E782 Mixed hyperlipidemia: Secondary | ICD-10-CM

## 2023-12-26 DIAGNOSIS — I10 Essential (primary) hypertension: Secondary | ICD-10-CM

## 2024-01-20 MED ORDER — ROSUVASTATIN CALCIUM 40 MG PO TABS: 40.0000 mg | ORAL_TABLET | Freq: Every day | ORAL | 3 refills | Status: DC

## 2024-01-28 ENCOUNTER — Other Ambulatory Visit: Payer: Self-pay

## 2024-02-07 ENCOUNTER — Other Ambulatory Visit: Payer: Self-pay

## 2024-02-17 ENCOUNTER — Ambulatory Visit: Payer: Self-pay

## 2024-02-17 NOTE — Telephone Encounter (Signed)
Noted pt has been scheduled.

## 2024-02-17 NOTE — Telephone Encounter (Signed)
 FYI Only or Action Required?: FYI only for provider: appointment scheduled on 02/18/24.  Patient was last seen in primary care on 11/04/2023 by Vicci Barnie NOVAK, MD.  Called Nurse Triage reporting Back Pain.  Symptoms began several weeks ago.  Interventions attempted: OTC medications: Tylenol .  Symptoms are: unchanged.  Triage Disposition: See Physician Within 24 Hours  Patient/caregiver understands and will follow disposition?: Yes    Reason for Disposition  MODERATE pain (e.g., interferes with normal activities or awakens from sleep)  Answer Assessment - Initial Assessment Questions Spanish IntBETHA Channel ID# [id number]. Pt denies hx of kidney stones.   1. ONSET: When did the pain begin? (e.g., minutes, hours, days)     3 weeks   2. LOCATION: Where does it hurt? (upper, mid or lower back)     Sides of back, when asked if in between hips and rib cage, patient stated yes.   3. SEVERITY: How bad is the pain?  (e.g., Scale 1-10; mild, moderate, or severe)      4. PATTERN: Is the pain constant? (e.g., yes, no; constant, intermittent)      Constant  5. RADIATION: Does the pain shoot into your legs or somewhere else?     Down to both hips  6. CAUSE:  What do you think is causing the back pain?      Unsure  7. BACK OVERUSE:  Any recent lifting of heavy objects, strenuous work or exercise?     Denies  8. MEDICINES: What have you taken so far for the pain? (e.g., nothing, acetaminophen , NSAIDS)     Tylenol   9. NEUROLOGIC SYMPTOMS: Do you have any weakness, numbness, or problems with bowel/bladder control?     Denies  10. OTHER SYMPTOMS: Do you have any other symptoms? (e.g., fever, abdomen pain, burning with urination, blood in urine)       Urinary frequency, getting out a little more than a few drops. denies dysuria, hematuria  Protocols used: Back Pain-A-AH, Flank Pain-A-AH  Message from Tiffini S sent at 02/17/2024  3:40 PM EST  Reason for Triage:  Patient had something found in her lungs- she is now having upper back pain that travels downwards to her hip- feels like a burning sensation that Tylenol  is not helping with discomfort Spanish IntBETHA Channel ID# [phone]

## 2024-02-18 ENCOUNTER — Ambulatory Visit: Payer: Self-pay | Attending: Family Medicine | Admitting: Family Medicine

## 2024-02-18 ENCOUNTER — Other Ambulatory Visit: Payer: Self-pay | Admitting: Internal Medicine

## 2024-02-18 ENCOUNTER — Encounter: Payer: Self-pay | Admitting: Family Medicine

## 2024-02-18 ENCOUNTER — Other Ambulatory Visit: Payer: Self-pay

## 2024-02-18 VITALS — BP 177/77 | HR 75 | Temp 98.0°F | Ht <= 58 in | Wt 131.0 lb

## 2024-02-18 DIAGNOSIS — E1142 Type 2 diabetes mellitus with diabetic polyneuropathy: Secondary | ICD-10-CM

## 2024-02-18 DIAGNOSIS — I2510 Atherosclerotic heart disease of native coronary artery without angina pectoris: Secondary | ICD-10-CM

## 2024-02-18 DIAGNOSIS — M549 Dorsalgia, unspecified: Secondary | ICD-10-CM

## 2024-02-18 DIAGNOSIS — R350 Frequency of micturition: Secondary | ICD-10-CM

## 2024-02-18 DIAGNOSIS — I10 Essential (primary) hypertension: Secondary | ICD-10-CM

## 2024-02-18 LAB — POCT URINALYSIS DIP (CLINITEK)
Bilirubin, UA: NEGATIVE
Blood, UA: NEGATIVE
Glucose, UA: NEGATIVE mg/dL
Ketones, POC UA: NEGATIVE mg/dL
Leukocytes, UA: NEGATIVE
Nitrite, UA: NEGATIVE
POC PROTEIN,UA: 100 — AB
Spec Grav, UA: 1.02
Urobilinogen, UA: 0.2 U/dL
pH, UA: 6

## 2024-02-18 LAB — GLUCOSE, POCT (MANUAL RESULT ENTRY): POC Glucose: 109 mg/dL — AB (ref 70–99)

## 2024-02-18 MED ORDER — PHENAZOPYRIDINE HCL 95 MG PO TABS
95.0000 mg | ORAL_TABLET | Freq: Three times a day (TID) | ORAL | 0 refills | Status: AC | PRN
  Filled 2024-02-18: qty 30, 10d supply, fill #0

## 2024-02-18 MED ORDER — ROSUVASTATIN CALCIUM 40 MG PO TABS
40.0000 mg | ORAL_TABLET | Freq: Every day | ORAL | 3 refills | Status: AC
  Filled 2024-02-18: qty 90, 90d supply, fill #0

## 2024-02-18 MED ORDER — TIZANIDINE HCL 4 MG PO TABS
4.0000 mg | ORAL_TABLET | Freq: Three times a day (TID) | ORAL | 1 refills | Status: AC | PRN
  Filled 2024-02-18: qty 30, 10d supply, fill #0

## 2024-02-18 NOTE — Progress Notes (Signed)
 "  Subjective:  Patient ID: Tammy Osborne, female    DOB: Oct 17, 1955  Age: 69 y.o. MRN: [id number]  CC: Urinary Frequency (Low back pain X1 month)     Discussed the use of AI scribe software for clinical note transcription with the patient, who gave verbal consent to proceed.  History of Present Illness Tammy Osborne is a 69 year old female patient of Dr Vicci with a history of CAD s/p PCI, Type 2 DM, ILD , Hyperlipidemia, Hypertension who presents with urinary frequency.  She has had urinary frequency for the past month without hematuria or dysuria. She describes a burning pain that starts in her upper back and radiates downward to lumbar spine (and does not get into her legs) but denies presence of flank pain. She has no abdominal pain/nausea/vomiting/diarrhea/constipation or fever.  She has hypertension treated with blood pressure medication which she endorses adherence to although BP was 130/60 in November.    Past Medical History:  Diagnosis Date   Coronary artery disease    Diabetes mellitus without complication (HCC)    Hypertension     Past Surgical History:  Procedure Laterality Date   ABDOMINAL HYSTERECTOMY     CORONARY STENT INTERVENTION N/A 08/19/2023   Procedure: CORONARY STENT INTERVENTION;  Surgeon: Elmira Newman PARAS, MD;  Location: [facility];  Service: Cardiovascular;  Laterality: N/A;   LEFT HEART CATH AND CORONARY ANGIOGRAPHY N/A 08/19/2023   Procedure: LEFT HEART CATH AND CORONARY ANGIOGRAPHY;  Surgeon: Elmira Newman PARAS, MD;  Location: [facility];  Service: Cardiovascular;  Laterality: N/A;    Family History  Problem Relation Age of Onset   Hypertension Mother     Social History   Socioeconomic History   Marital status: Single    Spouse name: Not on file   Number of children: 2   Years of education: Not on file   Highest education level: Not on file  Occupational History   Not on file  Tobacco Use   Smoking  status: Never    Passive exposure: Yes   Smokeless tobacco: Never   Tobacco comments:    spouse smoked in home X6-7 years.   Vaping Use   Vaping status: Never Used  Substance and Sexual Activity   Alcohol use: No   Drug use: Never   Sexual activity: Not on file  Other Topics Concern   Not on file  Social History Narrative   Not on file   Social Drivers of Health   Tobacco Use: Medium Risk (12/05/2023)   Patient History    Smoking Tobacco Use: Never    Smokeless Tobacco Use: Never    Passive Exposure: Yes  Financial Resource Strain: Not on file  Food Insecurity: Not on file  Transportation Needs: Not on file  Physical Activity: Not on file  Stress: Not on file  Social Connections: Not on file  Depression (PHQ2-9): Low Risk (11/04/2023)   Depression (PHQ2-9)    PHQ-2 Score: 0  Alcohol Screen: Not on file  Housing: Not on file  Utilities: Not on file  Health Literacy: Not on file    Allergies[1]  Outpatient Medications Prior to Visit  Medication Sig Dispense Refill   amLODipine  (NORVASC ) 10 MG tablet Take 1 tablet (10 mg total) by mouth daily. 90 tablet 4   aspirin  EC 81 MG tablet Take 1 tablet (81 mg total) by mouth daily. 100 tablet 1   carvedilol  (COREG ) 12.5 MG tablet Take 1.5 tablets (  18.75 mg total) by mouth 2 (two) times daily with a meal. 270 tablet 4   gabapentin  (NEURONTIN ) 100 MG capsule Take 2 capsules (200 mg total) by mouth 2 (two) times daily. 360 capsule 0   icosapent  Ethyl (VASCEPA ) 1 g capsule Take 2 capsules (2 g total) by mouth 2 (two) times daily. 360 capsule 3   losartan  (COZAAR ) 100 MG tablet Take 1 tablet (100 mg total) by mouth daily. 90 tablet 1   nitroGLYCERIN  (NITROSTAT ) 0.4 MG SL tablet Place 1 tablet (0.4 mg total) under the tongue every 5 (five) minutes as needed for chest pain. 25 tablet 4   omeprazole  (PRILOSEC) 20 MG capsule Take 1 capsule (20 mg total) by mouth 2 (two) times daily before a meal. 180 capsule 2   rosuvastatin  (CRESTOR ) 40  MG tablet Take 1 tablet (40 mg total) by mouth daily. 90 tablet 3   ticagrelor  (BRILINTA ) 90 MG TABS tablet Take 1 tablet (90 mg total) by mouth 2 (two) times daily. 180 tablet 2   No facility-administered medications prior to visit.     ROS Review of Systems  Constitutional:  Negative for activity change and appetite change.  HENT:  Negative for sinus pressure and sore throat.   Respiratory:  Negative for chest tightness, shortness of breath and wheezing.   Cardiovascular:  Negative for chest pain and palpitations.  Gastrointestinal:  Negative for abdominal distention, abdominal pain and constipation.  Genitourinary:        See HPI  Musculoskeletal: Negative.   Psychiatric/Behavioral:  Negative for behavioral problems and dysphoric mood.     Objective:  BP (!) 177/77   Pulse 75   Temp 98 F (36.7 C) (Oral)   Ht 4' 10 (1.473 m)   Wt 131 lb (59.4 kg)   LMP  (LMP Unknown)   SpO2 98%   BMI 27.38 kg/m      02/18/2024    9:17 AM 02/18/2024    8:55 AM 12/05/2023    2:45 PM  BP/Weight  Systolic BP 177 174 130  Diastolic BP 77 70 60  Wt. (Lbs)  131 134.6  BMI  27.38 kg/m2 28.13 kg/m2      Physical Exam Constitutional:      Appearance: She is well-developed.  Cardiovascular:     Rate and Rhythm: Normal rate.     Heart sounds: Normal heart sounds. No murmur heard. Pulmonary:     Effort: Pulmonary effort is normal.     Breath sounds: Normal breath sounds. No wheezing or rales.  Chest:     Chest wall: No tenderness.  Abdominal:     General: Bowel sounds are normal. There is no distension.     Palpations: Abdomen is soft. There is no mass.     Tenderness: There is no abdominal tenderness. There is no right CVA tenderness or left CVA tenderness.  Musculoskeletal:        General: Normal range of motion.     Right lower leg: No edema.     Left lower leg: No edema.  Skin:    Comments: Negative straight leg raise bilaterally  Neurological:     Mental Status: She is  alert and oriented to person, place, and time.  Psychiatric:        Mood and Affect: Mood normal.        Latest Ref Rng & Units 12/24/2023    9:07 AM 08/27/2023    2:57 PM 08/20/2023    5:50 AM  CMP  Glucose 70 - 99 mg/dL 99   889   BUN 8 - 27 mg/dL 15   14   Creatinine 9.42 - 1.00 mg/dL 9.27   9.16   Sodium 865 - 144 mmol/L 142   137   Potassium 3.5 - 5.2 mmol/L 3.9   3.7   Chloride 96 - 106 mmol/L 105   104   CO2 20 - 29 mmol/L 20   23   Calcium  8.7 - 10.3 mg/dL 9.3   9.0   Total Protein 6.0 - 8.5 g/dL  6.5    Total Bilirubin 0.0 - 1.2 mg/dL  0.2    Alkaline Phos 44 - 121 IU/L  138    AST 0 - 40 IU/L  16    ALT 0 - 32 IU/L  26      Lipid Panel     Component Value Date/Time   CHOL 133 12/24/2023 0907   TRIG 256 (H) 12/24/2023 0907   HDL 35 (L) 12/24/2023 0907   CHOLHDL 3.8 12/24/2023 0907   CHOLHDL 4.9 08/17/2023 0538   VLDL 75 (H) 08/17/2023 0538   LDLCALC 57 12/24/2023 0907    CBC    Component Value Date/Time   WBC 8.0 08/27/2023 1457   WBC 10.5 08/20/2023 0550   RBC 4.73 08/27/2023 1457   RBC 4.76 08/20/2023 0550   HGB 12.6 08/27/2023 1457   HCT 40.2 08/27/2023 1457   PLT 386 08/27/2023 1457   MCV 85 08/27/2023 1457   MCH 26.6 08/27/2023 1457   MCH 26.7 08/20/2023 0550   MCHC 31.3 (L) 08/27/2023 1457   MCHC 32.5 08/20/2023 0550   RDW 14.8 08/27/2023 1457   LYMPHSABS 5.0 (H) 11/07/2022 1149   LYMPHSABS 3.2 (H) 11/07/2021 0834   MONOABS 1.0 11/07/2022 1149   EOSABS 0.0 11/07/2022 1149   EOSABS 0.1 11/07/2021 0834   BASOSABS 0.0 11/07/2022 1149   BASOSABS 0.0 11/07/2021 0834    Lab Results  Component Value Date   HGBA1C 6.7 (H) 12/24/2023       Assessment & Plan Urinary frequency Urine test negative for UTI. Blood glucose level 109, ruling out hyperglycemia. Consider non-infectious causes. - Prescribed Pyridium  for urinary frequency.  Hypertension Blood pressure elevated during visit.  BP at last visit suprisingly was normal.  Current  medication not effectively lowering blood pressure. - Continue current antihypertensive medication. - Follow up with PCP in one month to monitor blood pressure and adjust regimen if still above goal   Musculoskeletal low back pain -Placed on Tizanidine  -Advised to apply heat or ice whichever is tolerated to painful areas. Counseled on evidence of improvement in pain control with regards to yoga, water aerobics, massage, home physical therapy, exercise as tolerated.   Meds ordered this encounter  Medications   phenazopyridine  (PYRIDIUM ) 95 MG tablet    Sig: Take 1 tablet (95 mg total) by mouth 3 (three) times daily as needed for pain.    Dispense:  10 tablet    Refill:  0    Follow-up: Return for previously scheduled appointment.       Corrina Sabin, MD, FAAFP. [facility] [place], [place] [phone]   02/18/2024, 9:43 AM     [1] No Known Allergies  "

## 2024-02-18 NOTE — Patient Instructions (Signed)
 Phenazopyridine  Tablets Qu es este medicamento? La FENAZOPIRIDINA alivia los sntomas causados por la irritacin en el tracto urinario, tales education officer, environmental, ardor y geographical information systems officer frecuentemente cantidades pequeas de orina. Acta entumeciendo el revestimiento del tracto urinario y arboriculturist. No es un antibitico. No tratar una infeccin en el tracto urinario. Este medicamento puede ser utilizado para otros usos; si tiene alguna pregunta consulte con su proveedor de atencin mdica o con su farmacutico. MARCAS COMUNES: AZO, AZO Urinary Pain Relief Maximum Strength, Azo-100, Azo-Gesic, Azo-Septic, Azo-Standard, Phenazo, Prodium, Pyridium , Urinary Analgesic, Uristat, Uristat Relief, Uristat Ultra, URO-PAIN Qu le debo informar a mi profesional de la salud antes de tomar este medicamento? Necesitan saber si usted presenta alguno de los siguientes problemas o situaciones: Deficiencia de glucosa-6-fosfato deshidrogenasa (G6DP) Enfermedad renal Una reaccin alrgica o inusual a la fenazopiridina, a otros medicamentos, alimentos, colorantes o conservantes Si est embarazada o buscando quedar embarazada Si est amamantando a un beb Cmo debo utilizar este medicamento? Tome este medicamento por va oral con un vaso de agua. Siga las instrucciones de la etiqueta del producto. Tmelo despus de las comidas. Administre sus dosis a intervalos regulares. No use su medicamento con una frecuencia mayor a la indicada. No omita ninguna dosis ni suspenda el uso de su medicamento antes de lo indicado, aun si se siente mejor. No deje de usarlo, excepto si as lo indica su equipo de atencin. Hable con su equipo de atencin sobre el uso de este medicamento en nios. Puede requerir atencin especial. Sobredosis: Pngase en contacto inmediatamente con un centro toxicolgico o una sala de urgencia si usted cree que haya tomado demasiado medicamento.<br>ATENCIN: Reynolds american es solo para usted. No comparta este  medicamento con nadie. Qu sucede si me olvido de una dosis? Si olvida una dosis, adminstrela lo antes posible. Si es casi la hora de la prxima dosis, administre solo esa dosis. No se administre dosis adicionales o dobles. Qu puede interactuar con este medicamento? No se anticipan interacciones. Puede ser que esta lista no menciona todas las posibles interacciones. Informe a su profesional de beazer homes de ingram micro inc productos a base de hierbas, medicamentos de [place] o suplementos nutritivos que est tomando. Si usted fuma, consume bebidas alcohlicas o si utiliza drogas ilegales, indqueselo tambin a su profesional de beazer homes. Algunas sustancias pueden interactuar con su medicamento. A qu debo estar atento al usar ppl corporation? Si los sntomas no mejoran o si empeoran, consulte con su equipo de atencin. Este medicamento tie de autoliv fluidos corporales. Este efecto es inofensivo y geneticist, molecular despus de dejar de tomar el medicamento. Cambiar el color de la orina a naranja oscuro o a rojo. El color rojo podra manchar la ropa. Las lentes de contacto blandas podran mancharse permanentemente. Es mejor no usar lentes de special educational needs teacher usa  este medicamento. Si tiene diabetes, es posible que obtenga un resultado falso-positivo para psychologist, counselling orina. Hable con su equipo de atencin. Qu efectos secundarios puedo tener al boston scientific este medicamento? Efectos secundarios que debe informar a su equipo de atencin tan pronto como sea posible: Reacciones alrgicas: erupcin cutnea, comezn/picazn, urticaria, hinchazn de la cara, los labios, la lengua o la garganta Efectos secundarios que generalmente no requieren atencin mdica (debe informarlos a su equipo de atencin si persisten o si son molestos): Orina amarilla oscura, roja o marrn Malestar estomacal Puede ser que esta lista no menciona todos los posibles efectos secundarios. Comunquese a su mdico por asesoramiento mdico  [employer].  Usted puede informar los efectos secundarios a la FDA por telfono al 1-800-FDA-1088. Dnde debo guardar mi medicina? Mantenga fuera del alcance de nios y neurosurgeon. Guarde a sanmina-sci, entre 15 y 30 grados Celsius (59 y 31 grados Fahrenheit). Proteja de la luz y la humedad. Deseche todo el medicamento que no haya utilizado despus de la fecha de vencimiento. ATENCIN: Este folleto es un resumen. Puede ser que no cubra toda la posible informacin. Si usted tiene preguntas acerca de esta medicina, consulte con su mdico, su farmacutico o su profesional de radiographer, therapeutic.  2024 Elsevier/Gold Standard (2020-10-31 00:00:00)

## 2024-02-24 ENCOUNTER — Other Ambulatory Visit: Payer: Self-pay

## 2024-03-02 ENCOUNTER — Other Ambulatory Visit: Payer: Self-pay

## 2024-03-06 ENCOUNTER — Ambulatory Visit: Payer: Self-pay | Admitting: Internal Medicine

## 2024-03-20 ENCOUNTER — Ambulatory Visit: Payer: Self-pay | Admitting: Internal Medicine
